# Patient Record
Sex: Male | Born: 1945 | Race: White | Hispanic: No | Marital: Married | Smoking: Never smoker
Health system: Southern US, Community
[De-identification: ages and names within clinical notes are randomized; demographics above are authoritative.]

## PROBLEM LIST (undated history)

## (undated) DIAGNOSIS — I739 Peripheral vascular disease, unspecified: Secondary | ICD-10-CM

## (undated) DIAGNOSIS — D649 Anemia, unspecified: Secondary | ICD-10-CM

## (undated) DIAGNOSIS — E876 Hypokalemia: Secondary | ICD-10-CM

## (undated) DIAGNOSIS — K654 Sclerosing mesenteritis: Secondary | ICD-10-CM

## (undated) DIAGNOSIS — K529 Noninfective gastroenteritis and colitis, unspecified: Secondary | ICD-10-CM

## (undated) DIAGNOSIS — N179 Acute kidney failure, unspecified: Secondary | ICD-10-CM

## (undated) DIAGNOSIS — E7800 Pure hypercholesterolemia, unspecified: Secondary | ICD-10-CM

## (undated) DIAGNOSIS — K8020 Calculus of gallbladder without cholecystitis without obstruction: Secondary | ICD-10-CM

## (undated) DIAGNOSIS — K635 Polyp of colon: Secondary | ICD-10-CM

## (undated) DIAGNOSIS — K5792 Diverticulitis of intestine, part unspecified, without perforation or abscess without bleeding: Secondary | ICD-10-CM

## (undated) DIAGNOSIS — N189 Chronic kidney disease, unspecified: Secondary | ICD-10-CM

## (undated) DIAGNOSIS — M1990 Unspecified osteoarthritis, unspecified site: Secondary | ICD-10-CM

## (undated) DIAGNOSIS — I10 Essential (primary) hypertension: Secondary | ICD-10-CM

## (undated) HISTORY — PX: PILONIDAL CYST EXCISION: SHX744

## (undated) HISTORY — PX: EYE SURGERY: SHX253

## (undated) HISTORY — DX: Pure hypercholesterolemia, unspecified: E78.00

## (undated) HISTORY — DX: Diverticulitis of intestine, part unspecified, without perforation or abscess without bleeding: K57.92

## (undated) HISTORY — DX: Essential (primary) hypertension: I10

## (undated) HISTORY — DX: Hypokalemia: E87.6

## (undated) HISTORY — PX: CARDIAC CATHETERIZATION: SHX172

## (undated) HISTORY — DX: Sclerosing mesenteritis: K65.4

## (undated) HISTORY — PX: OTHER SURGICAL HISTORY: SHX169

## (undated) HISTORY — DX: Noninfective gastroenteritis and colitis, unspecified: K52.9

## (undated) HISTORY — DX: Calculus of gallbladder without cholecystitis without obstruction: K80.20

## (undated) HISTORY — DX: Acute kidney failure, unspecified: N17.9

## (undated) HISTORY — PX: COLONOSCOPY: SHX174

---

## 2004-07-16 ENCOUNTER — Ambulatory Visit: Payer: Self-pay | Admitting: Pain Medicine

## 2004-09-17 ENCOUNTER — Ambulatory Visit: Payer: Self-pay | Admitting: Pain Medicine

## 2004-10-20 ENCOUNTER — Ambulatory Visit: Payer: Self-pay | Admitting: Gastroenterology

## 2004-11-10 ENCOUNTER — Ambulatory Visit: Payer: Self-pay | Admitting: Gastroenterology

## 2004-11-26 ENCOUNTER — Ambulatory Visit: Payer: Self-pay | Admitting: Pain Medicine

## 2005-01-06 ENCOUNTER — Ambulatory Visit: Payer: Self-pay | Admitting: Gastroenterology

## 2005-02-02 ENCOUNTER — Ambulatory Visit: Payer: Self-pay | Admitting: Gastroenterology

## 2005-02-16 ENCOUNTER — Ambulatory Visit: Payer: Self-pay | Admitting: Pain Medicine

## 2005-03-02 ENCOUNTER — Ambulatory Visit: Payer: Self-pay | Admitting: Internal Medicine

## 2005-05-11 ENCOUNTER — Ambulatory Visit: Payer: Self-pay | Admitting: Pain Medicine

## 2005-08-03 ENCOUNTER — Ambulatory Visit: Payer: Self-pay | Admitting: Pain Medicine

## 2005-08-11 ENCOUNTER — Ambulatory Visit: Payer: Self-pay | Admitting: Pain Medicine

## 2005-10-07 ENCOUNTER — Ambulatory Visit: Payer: Self-pay | Admitting: Pain Medicine

## 2005-12-28 ENCOUNTER — Ambulatory Visit: Payer: Self-pay | Admitting: Pain Medicine

## 2006-03-02 ENCOUNTER — Ambulatory Visit: Payer: Self-pay | Admitting: Pain Medicine

## 2006-03-08 ENCOUNTER — Ambulatory Visit: Payer: Self-pay | Admitting: Pain Medicine

## 2006-03-09 ENCOUNTER — Ambulatory Visit: Payer: Self-pay | Admitting: Pain Medicine

## 2006-03-28 ENCOUNTER — Ambulatory Visit: Payer: Self-pay | Admitting: Pain Medicine

## 2006-04-28 ENCOUNTER — Ambulatory Visit: Payer: Self-pay | Admitting: Pain Medicine

## 2006-07-21 ENCOUNTER — Ambulatory Visit: Payer: Self-pay | Admitting: Pain Medicine

## 2006-11-01 ENCOUNTER — Ambulatory Visit: Payer: Self-pay | Admitting: Pain Medicine

## 2006-11-26 ENCOUNTER — Emergency Department: Payer: Self-pay | Admitting: Emergency Medicine

## 2007-01-19 ENCOUNTER — Ambulatory Visit: Payer: Self-pay | Admitting: Pain Medicine

## 2007-04-13 ENCOUNTER — Ambulatory Visit: Payer: Self-pay | Admitting: Pain Medicine

## 2007-04-20 ENCOUNTER — Emergency Department: Payer: Self-pay

## 2007-04-20 ENCOUNTER — Other Ambulatory Visit: Payer: Self-pay

## 2007-06-18 ENCOUNTER — Emergency Department: Payer: Self-pay | Admitting: Internal Medicine

## 2007-08-03 ENCOUNTER — Ambulatory Visit: Payer: Self-pay | Admitting: Pain Medicine

## 2007-12-05 ENCOUNTER — Ambulatory Visit: Payer: Self-pay | Admitting: Pain Medicine

## 2008-03-07 ENCOUNTER — Ambulatory Visit: Payer: Self-pay | Admitting: Pain Medicine

## 2008-03-25 ENCOUNTER — Ambulatory Visit: Payer: Self-pay | Admitting: Unknown Physician Specialty

## 2008-07-23 ENCOUNTER — Ambulatory Visit: Payer: Self-pay | Admitting: Pain Medicine

## 2008-11-21 ENCOUNTER — Ambulatory Visit: Payer: Self-pay | Admitting: Pain Medicine

## 2009-03-06 ENCOUNTER — Ambulatory Visit: Payer: Self-pay | Admitting: Pain Medicine

## 2009-03-12 ENCOUNTER — Ambulatory Visit: Payer: Self-pay | Admitting: Pain Medicine

## 2009-04-14 ENCOUNTER — Ambulatory Visit: Payer: Self-pay | Admitting: Neurosurgery

## 2009-05-12 ENCOUNTER — Ambulatory Visit: Payer: Self-pay | Admitting: Pain Medicine

## 2009-06-12 ENCOUNTER — Ambulatory Visit: Payer: Self-pay | Admitting: Pain Medicine

## 2009-10-21 ENCOUNTER — Ambulatory Visit: Payer: Self-pay | Admitting: Pain Medicine

## 2010-01-22 ENCOUNTER — Ambulatory Visit: Payer: Self-pay | Admitting: Pain Medicine

## 2010-05-28 ENCOUNTER — Ambulatory Visit: Payer: Self-pay | Admitting: Pain Medicine

## 2010-10-08 ENCOUNTER — Ambulatory Visit: Payer: Self-pay | Admitting: Pain Medicine

## 2011-02-09 ENCOUNTER — Ambulatory Visit: Payer: Self-pay | Admitting: Pain Medicine

## 2011-05-09 ENCOUNTER — Emergency Department: Payer: Self-pay | Admitting: Emergency Medicine

## 2011-06-08 ENCOUNTER — Ambulatory Visit: Payer: Self-pay | Admitting: Pain Medicine

## 2011-10-19 ENCOUNTER — Ambulatory Visit: Payer: Self-pay | Admitting: Pain Medicine

## 2012-02-22 ENCOUNTER — Ambulatory Visit: Payer: Self-pay | Admitting: Pain Medicine

## 2012-07-13 ENCOUNTER — Ambulatory Visit: Payer: Self-pay | Admitting: Pain Medicine

## 2012-12-11 ENCOUNTER — Ambulatory Visit: Payer: Self-pay | Admitting: Pain Medicine

## 2013-04-05 ENCOUNTER — Ambulatory Visit: Payer: Self-pay | Admitting: Unknown Physician Specialty

## 2013-05-01 ENCOUNTER — Ambulatory Visit: Payer: Self-pay | Admitting: Pain Medicine

## 2013-08-28 ENCOUNTER — Ambulatory Visit: Payer: Self-pay | Admitting: Pain Medicine

## 2013-12-25 ENCOUNTER — Ambulatory Visit: Payer: Self-pay | Admitting: Pain Medicine

## 2014-02-22 ENCOUNTER — Emergency Department: Payer: Self-pay | Admitting: Emergency Medicine

## 2014-04-30 ENCOUNTER — Ambulatory Visit: Payer: Self-pay | Admitting: Pain Medicine

## 2014-08-16 ENCOUNTER — Observation Stay: Payer: Self-pay | Admitting: Internal Medicine

## 2014-08-16 LAB — URINALYSIS, COMPLETE
BACTERIA: NONE SEEN
Bilirubin,UR: NEGATIVE
Blood: NEGATIVE
GLUCOSE, UR: NEGATIVE mg/dL (ref 0–75)
KETONE: NEGATIVE
LEUKOCYTE ESTERASE: NEGATIVE
Nitrite: NEGATIVE
PH: 7 (ref 4.5–8.0)
Protein: 30
RBC,UR: 2 /HPF (ref 0–5)
Specific Gravity: 1.024 (ref 1.003–1.030)
WBC UR: 1 /HPF (ref 0–5)

## 2014-08-16 LAB — LIPASE, BLOOD: Lipase: 75 U/L (ref 73–393)

## 2014-08-16 LAB — COMPREHENSIVE METABOLIC PANEL
ALBUMIN: 3.6 g/dL (ref 3.4–5.0)
ANION GAP: 10 (ref 7–16)
Alkaline Phosphatase: 64 U/L
BUN: 26 mg/dL — AB (ref 7–18)
Bilirubin,Total: 0.6 mg/dL (ref 0.2–1.0)
Calcium, Total: 9.1 mg/dL (ref 8.5–10.1)
Chloride: 103 mmol/L (ref 98–107)
Co2: 28 mmol/L (ref 21–32)
Creatinine: 1.44 mg/dL — ABNORMAL HIGH (ref 0.60–1.30)
EGFR (African American): >60
EGFR (Non-African Amer.): 52 — ABNORMAL LOW
Glucose: 165 mg/dL — ABNORMAL HIGH (ref 65–99)
OSMOLALITY: 290 (ref 275–301)
Potassium: 3.1 mmol/L — ABNORMAL LOW (ref 3.5–5.1)
SGOT(AST): 18 U/L (ref 15–37)
SGPT (ALT): 24 U/L
Sodium: 141 mmol/L (ref 136–145)
Total Protein: 7.9 g/dL (ref 6.4–8.2)

## 2014-08-16 LAB — BASIC METABOLIC PANEL
ANION GAP: 6 — AB (ref 7–16)
BUN: 20 mg/dL — ABNORMAL HIGH (ref 7–18)
CALCIUM: 7.8 mg/dL — AB (ref 8.5–10.1)
CHLORIDE: 108 mmol/L — AB (ref 98–107)
Co2: 28 mmol/L (ref 21–32)
Creatinine: 1.12 mg/dL (ref 0.60–1.30)
EGFR (African American): >60
EGFR (Non-African Amer.): >60
GLUCOSE: 108 mg/dL — AB (ref 65–99)
OSMOLALITY: 286 (ref 275–301)
Potassium: 3 mmol/L — ABNORMAL LOW (ref 3.5–5.1)
SODIUM: 142 mmol/L (ref 136–145)

## 2014-08-16 LAB — CBC WITH DIFFERENTIAL/PLATELET
BASOS ABS: 0 10*3/uL (ref 0.0–0.1)
BASOS PCT: 0.3 %
EOS ABS: 0 10*3/uL (ref 0.0–0.7)
EOS PCT: 0.6 %
HCT: 47.9 % (ref 40.0–52.0)
HGB: 15.8 g/dL (ref 13.0–18.0)
LYMPHS ABS: 0.2 10*3/uL — AB (ref 1.0–3.6)
LYMPHS PCT: 2.4 %
MCH: 31.2 pg (ref 26.0–34.0)
MCHC: 33.1 g/dL (ref 32.0–36.0)
MCV: 94 fL (ref 80–100)
MONO ABS: 0.3 x10 3/mm (ref 0.2–1.0)
MONOS PCT: 4 %
Neutrophil #: 7.5 10*3/uL — ABNORMAL HIGH (ref 1.4–6.5)
Neutrophil %: 92.7 %
PLATELETS: 196 10*3/uL (ref 150–440)
RBC: 5.07 10*6/uL (ref 4.40–5.90)
RDW: 13.2 % (ref 11.5–14.5)
WBC: 8.1 10*3/uL (ref 3.8–10.6)

## 2014-08-16 LAB — CLOSTRIDIUM DIFFICILE(ARMC)

## 2014-08-16 LAB — TROPONIN I: Troponin-I: <0.02 ng/mL

## 2014-08-16 LAB — MAGNESIUM: MAGNESIUM: 1.7 mg/dL — AB

## 2014-08-17 LAB — CBC WITH DIFFERENTIAL/PLATELET
Basophil #: 0 x10 3/mm 3
Basophil %: 0.2 %
Eosinophil #: 0.2 x10 3/mm 3
Eosinophil %: 4.4 %
HCT: 38.9 % — ABNORMAL LOW
HGB: 13 g/dL
Lymphocyte %: 20.7 %
Lymphs Abs: 1 x10 3/mm 3
MCH: 31.7 pg
MCHC: 33.5 g/dL
MCV: 95 fL
Monocyte #: 0.7 "x10 3/mm "
Monocyte %: 14.9 %
Neutrophil #: 2.8 x10 3/mm 3
Neutrophil %: 59.8 %
Platelet: 163 x10 3/mm 3
RBC: 4.1 x10 6/mm 3 — ABNORMAL LOW
RDW: 13.9 %
WBC: 4.6 x10 3/mm 3

## 2014-08-17 LAB — COMPREHENSIVE METABOLIC PANEL
ALBUMIN: 2.7 g/dL — AB (ref 3.4–5.0)
ALK PHOS: 43 U/L — AB
Anion Gap: 3 — ABNORMAL LOW (ref 7–16)
BUN: 16 mg/dL (ref 7–18)
Bilirubin,Total: 0.4 mg/dL (ref 0.2–1.0)
CO2: 30 mmol/L (ref 21–32)
Calcium, Total: 7.5 mg/dL — ABNORMAL LOW (ref 8.5–10.1)
Chloride: 111 mmol/L — ABNORMAL HIGH (ref 98–107)
Creatinine: 1.16 mg/dL (ref 0.60–1.30)
EGFR (African American): >60
EGFR (Non-African Amer.): >60
GLUCOSE: 100 mg/dL — AB (ref 65–99)
OSMOLALITY: 288 (ref 275–301)
Potassium: 3.3 mmol/L — ABNORMAL LOW (ref 3.5–5.1)
SGOT(AST): 15 U/L (ref 15–37)
SGPT (ALT): 20 U/L
Sodium: 144 mmol/L (ref 136–145)
Total Protein: 5.9 g/dL — ABNORMAL LOW (ref 6.4–8.2)

## 2014-08-19 DIAGNOSIS — N179 Acute kidney failure, unspecified: Secondary | ICD-10-CM | POA: Insufficient documentation

## 2014-08-19 DIAGNOSIS — K529 Noninfective gastroenteritis and colitis, unspecified: Secondary | ICD-10-CM

## 2014-08-19 HISTORY — DX: Acute kidney failure, unspecified: N17.9

## 2014-08-19 HISTORY — DX: Noninfective gastroenteritis and colitis, unspecified: K52.9

## 2014-08-27 ENCOUNTER — Ambulatory Visit: Payer: Self-pay | Admitting: Pain Medicine

## 2014-12-31 ENCOUNTER — Ambulatory Visit
Admit: 2014-12-31 | Disposition: A | Discharge status: Home / Self Care | Payer: Self-pay | Attending: Pain Medicine | Admitting: Pain Medicine

## 2015-01-18 NOTE — Discharge Summary (Signed)
PATIENT NAME:  Brandon Bright, Brandon Bright MR#:  [id number] DATE OF BIRTH:  October 22, 1945  DATE OF ADMISSION:  08/16/2014 DATE OF DISCHARGE:  08/17/2014  ADMITTING DIAGNOSES:  1. Acute gastroenteritis, unclear etiology.  2. Acute renal failure.  3. Hypokalemia.  4. Orthostatic hypotension due to dehydration.  5. History of hypertension.   DISCHARGE DIAGNOSES:  1. Acute gastroenteritis, probably viral.  2. Acute kidney injury, probably prerenal from dehydration, resolved.  3. Hypokalemia, supplemented with potassium.  4. Orthostatic hypotension due to dehydration, resolved.  5. Chronic history of hypertension.   PROCEDURES: None.   CONSULTATIONS: None.   BRIEF HISTORY: The patient is a 69 year old Caucasian male brought into the ED with a chief complaint of nausea, vomiting, and diarrhea. Please review history and physical for details. The patient is admitted to the hospital with acute gastroenteritis of unclear etiology. The patient was feeling dizzy at that time. The patient was complaining of abdominal pain. Ultrasound of the abdomen has revealed no acute cholecystitis. CAT scan of the abdomen was done in the ED which revealed cholelithiasis without CT findings of acute cholecystitis.   HOSPITAL COURSE:  1. Acute gastroenteritis, probably viral. The patient was given IV fluid and antiemetics. Stool for Clostridium difficile was tested, which was negative. Nausea, vomiting, and diarrhea were resolved by the next day, November 21. Patient's abdominal pain was also completely resolved. The patient was started on diet and he tolerated that without any difficulty.  2. Acute kidney injury from dehydration was resolved with IV fluids. Renal function is back to normal.  3. Hypokalemia. Replaced with potassium supplements. His magnesium level was low, that was also supplemented.  4. Orthostatic hypotension was from dehydration from nausea, vomiting, and diarrhea resolved with IV fluids. The patient was not  feeling dizzy anymore. His blood pressure medications were resumed, as patient has chronic history of hypertension. Otherwise hospital course was uneventful. Decision was made to discharge the patient under stable condition.   CONDITION AT THE TIME OF DISCHARGE: Stable.   ACTIVITY: As tolerated.   LABORATORY AND IMAGING STUDIES:  1. CAT scan of the abdomen and pelvis with contrast has revealed cholelithiasis without CT findings of acute cholecystitis. Misty mesentery may be reactive.  2. Ultrasound of the abdomen cholelithiasis with no evidence of acute cholecystitis. Negative for biliary dilatation. Focal hyperechoic structure in the right hepatic lobe, likely corresponding fat attenuating lesion on the recent CT. 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  3. Patient's potassium is 3.3, calcium is 7.5, chloride at 111, glucose 100. The rest of the BMP was normal. LFTs: Albumin at 2.7, total protein 5.9, AST and ALT are normal. Alkaline phosphatase is low at 43. Troponin less than 0.02 x 3. WBC is normal, hemoglobin 13.0, hematocrit is 38.9, platelets are 163,000. Stool for Clostridium difficile is negative.  4. Urinalysis: Nitrites and leukocyte esterase are negative.    PHYSICAL EXAMINATION:  VITAL SIGNS: Temperature 98.4, pulse 96, respirations 18, blood pressure 124/76, orthostatic blood pressure 119/76 in the supine posture, 126/78 in standing posture.   MEDICATIONS AT THE TIME OF DISCHARGE:  Lyrica 20 mg p.o. at bedtime, aspirin 325 mg p.o. once daily, AREDS gel 2 times a day, Neurontin 300 mg 2-3 tablets p.o. b.i.d. as tolerated, Prinzide 20/25 one tablet p.o. once daily start on November 22, Percocet 5/325 one tablet p.o. every 4 hours as needed for pain, promethazine 12.5 mg 1 tablet every 6 hours as needed for nausea and vomiting.   ACTIVITY: As tolerated.  DIET: Regular, regular consistency.   FOLLOWUP: With primary care physician in 1-2 weeks. Return to work in 2 days.    PRIMARY CARE PHYSICIAN: Dr. Sampson Goon.   CODE STATUS: Full code.   Plan of care was discussed in detail with the patient and his family members. They verbalized understanding of the plan.   TOTAL TIME SPENT ON THE DISCHARGE: 45 minutes.    ____________________________ Ramonita Lab, MD ag:bm D: 08/20/2014 19:27:00 ET T: 08/21/2014 00:13:03 ET JOB#: [id number]  cc: Ramonita Lab, MD, <Dictator> Stann Mainland. Sampson Goon, MD  Ramonita Lab MD ELECTRONICALLY SIGNED 08/23/2014 18:11

## 2015-01-18 NOTE — H&P (Signed)
PATIENT NAME:  Brandon Bright, Prem W MR#:  [id number] DATE OF BIRTH:  [birth year]  DATE OF ADMISSION:  08/16/2014  PRIMARY CARE PHYSICIAN: (Dictation Anomaly)   HISTORY OF PRESENT ILLNESS:  The patient is a 69 year old Caucasian male with past medical history significant for history of hypertension, hyperlipidemia, macular degeneration, back pain due to osteoarthritis presents to the hospital with complaints of nausea, vomiting and diarrhea.  According to the patient, he was doing well up until around 6:00 p.m., when he started having nausea.  That nausea exacerbated and the patient started dry heaving at around 10:00 p.m. then he started vomiting and had numerous episodes of diarrheal stool.  There was no blood in his vomitus or in diarrheal stool.  No black or tarry looking stools. He had no fevers however, the patient felt chilly.  He was vomiting and having diarrhea up until approximately 3:00 a.m. when he presented to the Emergency Room for further evaluation because he became very dizzy, lightheaded and dehydrated.  He denies any sick contacts.  He stated that he ate at [employer] for lunch yesterday, but otherwise no family members were sick and no one else ate with him in [employer] at lunchtime.  On arrival to the Emergency Room, he was complaining of some suprapubic abdominal pains.  He was also noted to have some tenderness on examination and left lower quadrant.  He was also noted to be tachycardic with heart rate racing around 120 whenever he stood up and he would become lightheaded.  He became presyncopal in the Emergency Room.  He had CT scan of his abdomen and pelvis done in Emergency Room which showed, no significant abnormalities in his colon, cholelithiasis, but no acute cholecystitis was noted. Mesentery was noted to be misty, which was thought to be possibly reactive.   Hospitalist services were contacted for admission as the patient received 3 liters of IV fluids and is not getting better  yet.    PAST MEDICAL HISTORY:  Significant for history of hypertension, hyperlipidemia, history of back pain due to osteoarthritis of the back, macular degeneration.   MEDICATIONS: Aspirin 325 mg p.o. daily, AREDS 1 gel twice daily, Neurontin 300 mg 2 to 3 times daily, Nutrilite, multivitamins 1 tablet once daily, Prinzide 25 mg 1 tablet once daily, Zocor 20 mg at bedtime.   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    ALLERGIES: VICODIN AS WELL AS CODEINE.   FAMILY HISTORY: Diabetes mellitus in the patient's brother and other brother has coronary disease and coronary artery bypass grafting in his 7760s. The patient's father coronary artery disease, also in his 5960s.  Mother migraines as well as stroke.   SOCIAL HISTORY: The patient is married for 49 years, lives with his wife has 2 children, daughter lives close by. No smoking, alcohol abuse. He is retired.  He used to [employer], [employer].    REVIEW OF SYSTEMS: Positive for feeling chilly, fatigue and weak over the past day, pains in his abdomen, also low suprapubic pain, sharp left lower quadrant pain, glaucoma, macular degeneration, cough with no sputum production, some sinus infection diagnosed in 06/2014 by Dr. Jenne CampusMcQueen, treated with antibiotic therapy, nausea, vomiting and diarrhea, feeling presyncopal. Otherwise, denies any high fevers, weight loss or gain, denies tinnitus, blurry vision, double vision or cataracts. Denies allergies, epistaxis, sinus pain,  dentures, difficulty swallowing.  RESPIRATORY: Denies any wheezes, asthma, chronic obstructive pulmonary disease.  CARDIOVASCULAR:  Denies chest pain, orthopnea, edema, arrhythmias, palpitations.  GASTROINTESTINAL: Denies any hematemesis, rectal bleeding.  GENITOURINARY: Denies dysuria, hematuria, frequency, incontinence.   ENDOCRINOLOGY: Denies polydipsia, nocturia, thyroid problems, heat or cold intolerance or thirst.   HEMATOLOGY:  Denies anemia, easy bruising, bleeding, swollen glands. SKIN: Denies any acne, rash, lesions, or change in moles.  MUSCULOSKELETAL: Denies arthritis, cramps or swelling. NEUROLOGIC:  Denies numbness, epilepsy or tremor. PSYCHIATRIC:  Denies anxiety, insomnia or depression.     PHYSICAL EXAMINATION:  On arrival to the hospital: VITAL SIGNS:  Temperature was 98.2, pulse 114, respirations 18, blood pressure 143/85, saturation was 97% on room air. GENERAL:  Well-developed, well-nourished Caucasian male in no significant distress, on the stretcher.  HEENT:  The pupils were equal, reactive to light.  Extraocular muscles intact, no icterus or conjunctivitis.  Has normal hearing.  No pharyngeal erythema. Mucosa is dry.  NECK: No masses. Supple, nontender.  Thyroid not enlarged.  No adenopathy. No JVD or carotid bruits bilaterally. Full range of motion.  LUNGS: Clear to auscultation in all fields. No rales, rhonchi, diminished breath sounds or wheezing.  No labored inspirations, increased effort, dullness to percussion or overt respiratory distress.    CARDIOVASCULAR: S1, S2 appreciated. Rhythm is regular. PMI not lateralized. Chest is nontender to palpation, 1+ pedal pulses, no lower extremity edema, calf tenderness or cyanosis was noted.  ABDOMEN: Soft, tender in the left mid abdominal zone, somewhat uncomfortable in left lower quadrant, but no rebound or guarding were noted. Some minimal discomfort in suprapubic area. No hepatosplenomegaly or masses were noted.  RECTAL: Deferred.  MUSCLE STRENGTH: Able to move all extremities. No cyanosis, degenerative joint disease or kyphosis. Gait was not tested.  SKIN: Did not reveal rashes, lesions, erythema, nodularity, or induration. It was warm and dry to palpation.  LYMPHATIC: No adenopathy in the cervical region.  NEUROLOGICAL: Cranial nerves  grossly intact. Sensory is intact. No dysarthria or aphasia. The patient is alert, oriented to time, person and place, cooperative. Memory is good.  PSYCHIATRIC: No significant confusion, agitation, or depression noted. The patient was became tearful when talking about his children.    IMAGING:  The patient's EKG sinus tachycardia, (Dictation Anomaly) beats per minute, normal axis, left atrial enlargement, right bundle branch block, nonspecific ST-T changes.   LABORATORY DATA:  BUN and creatinine were 26 and 1.44, potassium 3.1, magnesium 1.7. Glucose 165, lipase 75, liver enzymes normal. The patient's troponin less than 0.02. White blood cell count is normal at 8.1, hemoglobin 14.8, (Dictation Anomaly), count is elevated at 7.5. Urinalysis; 2 red blood cells, 1 white blood cells. No nitrites or leukocyte esterase.   RADIOLOGIC STUDIES: Ultrasound of abdomen, limited survey, 08/16/2014 showed cholelithiasis but no evidence of acute cholecystitis, focal hypoechoic structure in the right hepatic lobe most likely corresponds with (Dictation Anomaly) on recent CT.  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  Negative for biliary dilatation.  CT scan of the abdomen and pelvis with contrast 08/16/2014 revealed cholelithiasis without CT evidence of acute cholecystitis, (Dictation Anomaly) of mesentery, which may be reactive.   ASSESSMENT AND PLAN:  1.  Acute gastroenteritis of unclear etiology at this time.  Rule out, questionable bacterial, toxic or viral.  We will add bacterial (Dictation Anomaly)  and continue IV fluids.  Continue antiemetics.   Get clostridium difficile as well as stool cultures and we will initiate antibiotic therapy if any of those cultures  are positive.  2.   Acute renal failure. Continue IV fluids. Follow the patient's kidney function daily.  3.  Hypokalemia; supplement IV.  The patient's magnesium level was also low, supplement IV as well.  4.  Orthostatic hypertension due  to dehydration.  We will continue IV fluids. We will follow up orthostatic vital signs, and we will hold blood pressure medications.  5.  History of hypertension, and now relatively hypotensive with tachycardia. We will hold blood pressure medications.   TIME SPENT: 50 minutes on this patient.      ____________________________ Katharina Caper, MD rv:DT D: 08/16/2014 11:18:40 ET T: 08/16/2014 12:22:43 ET JOB#: [id number]  cc: Katharina Caper, MD, <Dictator> Karri Kallenbach MD ELECTRONICALLY SIGNED 08/26/2014 21:14

## 2015-05-01 ENCOUNTER — Ambulatory Visit: Payer: Medicare Other | Attending: Pain Medicine | Admitting: Pain Medicine

## 2015-05-01 ENCOUNTER — Encounter: Payer: Self-pay | Admitting: Pain Medicine

## 2015-05-01 VITALS — BP 122/75 | HR 75 | Temp 97.7°F | Resp 16 | Ht 70.0 in | Wt 215.0 lb

## 2015-05-01 DIAGNOSIS — M5126 Other intervertebral disc displacement, lumbar region: Secondary | ICD-10-CM | POA: Insufficient documentation

## 2015-05-01 DIAGNOSIS — K8020 Calculus of gallbladder without cholecystitis without obstruction: Secondary | ICD-10-CM | POA: Diagnosis not present

## 2015-05-01 DIAGNOSIS — M79601 Pain in right arm: Secondary | ICD-10-CM | POA: Diagnosis present

## 2015-05-01 DIAGNOSIS — M5030 Other cervical disc degeneration, unspecified cervical region: Secondary | ICD-10-CM | POA: Diagnosis not present

## 2015-05-01 DIAGNOSIS — M542 Cervicalgia: Secondary | ICD-10-CM | POA: Diagnosis present

## 2015-05-01 DIAGNOSIS — M47812 Spondylosis without myelopathy or radiculopathy, cervical region: Secondary | ICD-10-CM | POA: Insufficient documentation

## 2015-05-01 DIAGNOSIS — M5136 Other intervertebral disc degeneration, lumbar region: Secondary | ICD-10-CM | POA: Diagnosis not present

## 2015-05-01 DIAGNOSIS — K5790 Diverticulosis of intestine, part unspecified, without perforation or abscess without bleeding: Secondary | ICD-10-CM | POA: Insufficient documentation

## 2015-05-01 DIAGNOSIS — M79602 Pain in left arm: Secondary | ICD-10-CM | POA: Diagnosis present

## 2015-05-01 MED ORDER — GABAPENTIN 300 MG PO CAPS: ORAL_CAPSULE | ORAL | Status: DC

## 2015-05-01 NOTE — Progress Notes (Signed)
   Subjective:    Patient ID: Brandon Bright, male    DOB: 19-May-1946, 69 y.o.   MRN: [id number]  HPI  Patient 69 year old gentleman [facility] and treatment of pain involving the neck upper extremity regions as well as the lower back and lower extremity region. Patient states the pain is well controlled with Neurontin alone patient without the use of Cymbalta or Skelaxin. Patient states that he was recently seen pending emergency room at [facility] in [place] and was told that he had diverticular disease as well as gallstones. Patient will follow-up with his primary care physician Dr. Sampson Goon for further assessment of his condition at this time as discussed the patient was understanding and in agreement with suggested treatment plan. We will continue patient's Neurontin at this time and will remain available to consider modification of treatment should there be significant change in patient's condition. Patient is understanding and agree with suggested treatment plan.     Review of Systems     Objective:   Physical Exam  There was minimal tenderness of the splenius capitis and occipitalis musculature region. Patient was with bilaterally equal grip strength. Tinel and Phalen's maneuver were without increase of pain significant degree. Palpation of the acromioclavicular glenohumeral joint region was without significantly increased pain. There was no crepitus of the thoracic region noted. Palpation over the lumbar paraspinal musculature region lumbar facet region was with mild discomfort. Lateral bending and rotation and extension and palpation of the lumbar facets reproduce mild discomfort. There was mild tenderness over the PSIS PII S region as well as the gluteal and piriformis musculature regions. Mild tenderness of the greater trochanteric region iliotibial band region. No sensory deficit of dermatomal distribution detected there was negative clonus  and negative Homans. Abdomen was nontender with no costovertebral tenderness noted.    Assessment & Plan:   Degenerative disc disease lumbar spine Broad-based disc bulging L1-2 through L5-S1 with L5-S1 moderate-sized central disc protrusion pressing the ventral thecal sac with evidence of facet arthropathy as well as L4-L5 right paracentral disc component abutting the extraforaminal right L4 nerve root  Degenerative disc disease cervical spine Degenerative changes multilevel involvement with C3-4 C4-5 degenerative changes especially  Diverticular disease  Cholelithiasis   Plan   Continue present medication Neurontin  F/U PCP Dr. Sampson Goon diverticular disease cholelithiasis as well as for for evaliation of  BP and general medical  Condition  Gastroenterological evaluation. We encouraged patient follow-up with Dr. Sampson Goon for further evaluation of his GI condition and to consider gastroenterological evaluation as well  F/U surgical evaluation  F/U neurological evaluation  May consider radiofrequency rhizolysis or intraspinal procedures pending response to present treatment and F/U evaluation   [facility] to call [facility] should patient have concerns prior to scheduled return appointmen.

## 2015-05-01 NOTE — Patient Instructions (Addendum)
Continue present medication Neurontin  F/U PCP Dr. Sampson Goon for evaliation of  BP and general medical  condition  F/U surgical evaluation  F/U neurological evaluation  May consider radiofrequency rhizolysis or intraspinal procedures pending response to present treatment and F/U evaluation   [facility] to call [facility] should patient have concerns prior to scheduled return appointmen.

## 2015-05-01 NOTE — Progress Notes (Signed)
Discharged to home, ambulatory with script in hand for neurontin.  Return in 3 months

## 2015-05-01 NOTE — Progress Notes (Signed)
Safety precautions to be maintained throughout the outpatient stay will include: orient to surroundings, keep bed in low position, maintain call bell within reach at all times, provide assistance with transfer out of bed and ambulation.  

## 2015-05-05 ENCOUNTER — Encounter: Payer: Self-pay | Admitting: Pain Medicine

## 2015-07-11 ENCOUNTER — Encounter: Payer: Self-pay | Admitting: Emergency Medicine

## 2015-07-11 ENCOUNTER — Ambulatory Visit
Admission: EM | Admit: 2015-07-11 | Discharge: 2015-07-11 | Disposition: A | Discharge status: Home / Self Care | Payer: Medicare Other | Attending: Internal Medicine | Admitting: Internal Medicine

## 2015-07-11 DIAGNOSIS — J069 Acute upper respiratory infection, unspecified: Secondary | ICD-10-CM

## 2015-07-11 MED ORDER — TRIAMCINOLONE ACETONIDE 55 MCG/ACT NA AERO: 2.0000 | INHALATION_SPRAY | Freq: Every day | NASAL | Status: DC

## 2015-07-11 NOTE — ED Notes (Signed)
Patient c/o runny nose, HAs, and sinus congestion and pressure since yesterday.  Patient denies fevers.

## 2015-07-11 NOTE — Discharge Instructions (Signed)
No signs of dangerous illness today. Nasacort or flonase may help with congestion; prescription for nasacort sent to the pharmacy. Anticipate gradual improvement in congestion, sinus drainage, other symptoms, over the next week or so. Recheck or followup with Dr Sampson GoonFitzgerald if not starting to improve in a few days, or for new fever >100.5 or increasing phlegm production.  Upper Respiratory Infection, Adult Most upper respiratory infections (URIs) are a viral infection of the air passages leading to the lungs. A URI affects the nose, throat, and upper air passages. The most common type of URI is nasopharyngitis and is typically referred to as "the common cold." URIs run their course and usually go away on their own. Most of the time, a URI does not require medical attention, but sometimes a bacterial infection in the upper airways can follow a viral infection. This is called a secondary infection. Sinus and middle ear infections are common types of secondary upper respiratory infections. Bacterial pneumonia can also complicate a URI. A URI can worsen asthma and chronic obstructive pulmonary disease (COPD). Sometimes, these complications can require emergency medical care and may be life threatening.  CAUSES Almost all URIs are caused by viruses. A virus is a type of germ and can spread from one person to another.  RISKS FACTORS You may be at risk for a URI if:   You smoke.   You have chronic heart or lung disease.  You have a weakened defense (immune) system.   You are very young or very old.   You have nasal allergies or asthma.  You work in crowded or poorly ventilated areas.  You work in health care facilities or schools. SIGNS AND SYMPTOMS  Symptoms typically develop 2-3 days after you come in contact with a cold virus. Most viral URIs last 7-10 days. However, viral URIs from the influenza virus (flu virus) can last 14-18 days and are typically more severe. Symptoms may include:    Runny or stuffy (congested) nose.   Sneezing.   Cough.   Sore throat.   Headache.   Fatigue.   Fever.   Loss of appetite.   Pain in your forehead, behind your eyes, and over your cheekbones (sinus pain).  Muscle aches.  DIAGNOSIS  Your health care provider may diagnose a URI by:  Physical exam.  Tests to check that your symptoms are not due to another condition such as:  Strep throat.  Sinusitis.  Pneumonia.  Asthma. TREATMENT  A URI goes away on its own with time. It cannot be cured with medicines, but medicines may be prescribed or recommended to relieve symptoms. Medicines may help:  Reduce your fever.  Reduce your cough.  Relieve nasal congestion. HOME CARE INSTRUCTIONS   Take medicines only as directed by your health care provider.   Gargle warm saltwater or take cough drops to comfort your throat as directed by your health care provider.  Use a warm mist humidifier or inhale steam from a shower to increase air moisture. This may make it easier to breathe.  Drink enough fluid to keep your urine clear or pale yellow.   Eat soups and other clear broths and maintain good nutrition.   Rest as needed.   Return to work when your temperature has returned to normal or as your health care provider advises. You may need to stay home longer to avoid infecting others. You can also use a face mask and careful hand washing to prevent spread of the virus.  Increase the usage  of your inhaler if you have asthma.   Do not use any tobacco products, including cigarettes, chewing tobacco, or electronic cigarettes. If you need help quitting, ask your health care provider. PREVENTION  The best way to protect yourself from getting a cold is to practice good hygiene.   Avoid oral or hand contact with people with cold symptoms.   Wash your hands often if contact occurs.  There is no clear evidence that vitamin C, vitamin E, echinacea, or exercise  reduces the chance of developing a cold. However, it is always recommended to get plenty of rest, exercise, and practice good nutrition.  SEEK MEDICAL CARE IF:   You are getting worse rather than better.   Your symptoms are not controlled by medicine.   You have chills.  You have worsening shortness of breath.  You have brown or red mucus.  You have yellow or brown nasal discharge.  You have pain in your face, especially when you bend forward.  You have a fever.  You have swollen neck glands.  You have pain while swallowing.  You have white areas in the back of your throat. SEEK IMMEDIATE MEDICAL CARE IF:   You have severe or persistent:  Headache.  Ear pain.  Sinus pain.  Chest pain.  You have chronic lung disease and any of the following:  Wheezing.  Prolonged cough.  Coughing up blood.  A change in your usual mucus.  You have a stiff neck.  You have changes in your:  Vision.  Hearing.  Thinking.  Mood. MAKE SURE YOU:   Understand these instructions.  Will watch your condition.  Will get help right away if you are not doing well or get worse.   This information is not intended to replace advice given to you by your health care provider. Make sure you discuss any questions you have with your health care provider.   Document Released: 03/09/2001 Document Revised: 01/28/2015 Document Reviewed: 12/19/2013 Elsevier Interactive Patient Education [employer]

## 2015-07-11 NOTE — ED Provider Notes (Signed)
CSN: [id number]     Arrival date & time 07/11/15  [id number] History   First MD Initiated Contact with Patient 07/11/15 1004     Chief Complaint  Patient presents with  . Facial Pain  . Headache  . Nasal Congestion   HPI  Patient is a 69 year old gentleman who presents with the onset yesterday of achiness, malaise, headache. A lot of clear runny nose, some sinus drainage. No fever. Occasional coughing. Throat irritation. Didn't sleep well last night. No vomiting, no diarrhea. Appetite is fine. No rash. PCP is Dr. Sampson GoonFitzgerald. He is undergoing evaluation for episodic abdominal pain.  Past Medical History  Diagnosis Date  . Hypertension   . Hypercholesterolemia   . Diverticulitis   . Gallstones    Past Surgical History  Procedure Laterality Date  . Ganglion cyst removal Right    Family History  Problem Relation Age of Onset  . Heart disease Father    Social History  Substance Use Topics  . Smoking status: Never Smoker   . Smokeless tobacco: None  . Alcohol Use: No    Review of Systems  All other systems reviewed and are negative.   Allergies  Codeine and Vicodin  Home Medications   Prior to Admission medications   Medication Sig Start Date End Date Taking? Authorizing Provider  latanoprost (XALATAN) 0.005 % ophthalmic solution Place 1 drop into both eyes at bedtime.   Yes Historical Provider, MD  aspirin 325 MG tablet Take 325 mg by mouth daily.    Historical Provider, MD  fexofenadine (ALLEGRA) 180 MG tablet Take 180 mg by mouth daily. As needed    Historical Provider, MD  gabapentin (NEURONTIN) 300 MG capsule Limit 2-3 tablets by mouth per day or twice per day if tolerated 05/01/15   Ewing SchleinGregory Crisp, MD  lisinopril-hydrochlorothiazide (PRINZIDE,ZESTORETIC) 20-25 MG per tablet Take 1 tablet by mouth daily.    Historical Provider, MD  Multiple Vitamins-Minerals (PRESERVISION AREDS PO) Take by mouth daily.    Historical Provider, MD  simvastatin (ZOCOR) 20 MG tablet Take 20 mg by  mouth daily.    Historical Provider, MD           BP 115/57 mmHg  Pulse 80  Temp(Src) 98.9 F (37.2 C) (Tympanic)  Resp 17  Ht 5\' 10"  (1.778 m)  Wt 228 lb (103.42 kg)  BMI 32.71 kg/m2  SpO2 95%   Physical Exam  Constitutional: He is oriented to person, place, and time. No distress.  Alert, nicely groomed Voice is quite congested Able to walk into the urgent care independently  HENT:  Head: Atraumatic.  Bilateral TMs flushed faintly pink, mildly dull Marked nasal congestion bilaterally Throat somewhat red  Eyes:  Conjugate gaze, no eye redness/drainage  Neck: Neck supple.  Cardiovascular: Normal rate and regular rhythm.   Pulmonary/Chest: No respiratory distress. He has no wheezes. He has no rales.  Lungs clear, symmetric breath sounds  Abdominal: Soft. He exhibits no distension.  Musculoskeletal: Normal range of motion.  Trace bilateral lower extremity edema, patient reports usually right greater than left. Wearing below the knee bilateral venous compression stockings.  Neurological: He is alert and oriented to person, place, and time.  Skin: Skin is warm and dry.  Pink. No cyanosis  Nursing note and vitals reviewed.   ED Course  Procedures  None today  MDM   1. Upper respiratory infection    A nasal steroid like Flonase or Nasacort may relieve congestion. Prescription was sent to the pharmacy. For profuse  rhinorrhea, Claritin or similar OTC antihistamine may be helpful. Anticipate gradual improvement in congestion, rhinorrhea, and well-being, over the next week or so. Recheck or follow up PCP for new fever greater than 100.5, increasing phlegm production.    Eustace Moore, MD 07/11/15 [phone]

## 2015-09-02 ENCOUNTER — Encounter: Payer: Self-pay | Admitting: Pain Medicine

## 2015-09-02 ENCOUNTER — Ambulatory Visit: Payer: Medicare Other | Attending: Pain Medicine | Admitting: Pain Medicine

## 2015-09-02 VITALS — BP 132/78 | HR 77 | Temp 97.9°F | Resp 16 | Ht 70.0 in | Wt 218.0 lb

## 2015-09-02 DIAGNOSIS — M47816 Spondylosis without myelopathy or radiculopathy, lumbar region: Secondary | ICD-10-CM | POA: Insufficient documentation

## 2015-09-02 DIAGNOSIS — M79602 Pain in left arm: Secondary | ICD-10-CM | POA: Diagnosis present

## 2015-09-02 DIAGNOSIS — M545 Low back pain: Secondary | ICD-10-CM | POA: Diagnosis present

## 2015-09-02 DIAGNOSIS — M5030 Other cervical disc degeneration, unspecified cervical region: Secondary | ICD-10-CM

## 2015-09-02 DIAGNOSIS — K8020 Calculus of gallbladder without cholecystitis without obstruction: Secondary | ICD-10-CM | POA: Insufficient documentation

## 2015-09-02 DIAGNOSIS — M47812 Spondylosis without myelopathy or radiculopathy, cervical region: Secondary | ICD-10-CM | POA: Insufficient documentation

## 2015-09-02 DIAGNOSIS — M5136 Other intervertebral disc degeneration, lumbar region: Secondary | ICD-10-CM | POA: Diagnosis not present

## 2015-09-02 DIAGNOSIS — M5126 Other intervertebral disc displacement, lumbar region: Secondary | ICD-10-CM | POA: Diagnosis not present

## 2015-09-02 DIAGNOSIS — M542 Cervicalgia: Secondary | ICD-10-CM | POA: Diagnosis present

## 2015-09-02 MED ORDER — GABAPENTIN 300 MG PO CAPS: ORAL_CAPSULE | ORAL | Status: DC

## 2015-09-02 NOTE — Progress Notes (Signed)
   Subjective:    Patient ID: Brandon Bright, male    DOB: 10/18/1945, 69 y.o.   MRN: [id number]  HPI  Patient is 69 year old gentleman who returns to pain management Center for further evaluation and treatment of pain involving the region of the neck upper extremity region as well as the lower back and lower extremity region. Patient states she is doing remarkably well at this time. Patient continues the use of Neurontin. We discussed patient's condition patient stated that he [place] and walked the entire distance of the parade as well as walking consider for distance after the pure. The patient also recently [facility] [place] and also considerable distances. The patient appears to be without significant pain and is able to enjoying activities of daily living without experiencing any significant pain and is without undesirable side effects due to Neurontin. We will continue Neurontin as prescribed and we'll remain available to consider modification of treatment should there be change patient's condition. The patient was with understanding and agreed to suggested treatment plan.      Review of Systems     Objective:   Physical Exam  There was minimal tenderness to palpation of the splenius capitis and occipitalis musketry region. There was minimal tenderness to palpation over the cervical facet cervical paraspinal muscular treat. Patient appeared to be with bilaterally equal grip strength and Tinel and Phalen's maneuver were without increased pain of significant degree. Palpation over the thoracic facet thoracic paraspinal must reason was without increased pain of significant degree and no crepitus of the thoracic region was noted. Palpation over the lumbar paraspinal must reason lumbar facet region was without increased pain of significant degree. Lateral bending rotation extension and palpation of the lumbar facets reproduced minimal discomfort. There was  minimal tense to palpation over the PSIS and PII S region as well as the gluteal and piriformis musculature region. DTRs appeared to be trace at the knees there was negative clonus negative Homansand she deficit or dermatomal distribution detected. Abdomen soft nontender and no costovertebral tenderness noted.                Assessment & Plan:     Degenerative disc disease lumbar spine Broad-based disc bulging L1-2 through L5-S1 with L5-S1 moderate-sized central disc protrusion pressing the ventral thecal sac with evidence of facet arthropathy as well as L4-L5 right paracentral disc component abutting the extraforaminal right L4 nerve root  Degenerative disc disease cervical spine Degenerative changes multilevel involvement with C3-4 C4-5 degenerative changes especially  Diverticular disease  Cholelithiasis     PLAN   Continue present medication Neurontin  F/U PCP Dr. Sampson GoonFitzgerald for evaliation of  BP and general medical  condition  F/U surgical evaluation  F/U neurological evaluation  May consider radiofrequency rhizolysis or intraspinal procedures pending response to present treatment and F/U evaluation Patient with pain well-controlled at this time. Will avoid interventional treatment   Patient to call Pain Management Center should patient have concerns prior to scheduled return appointment.

## 2015-09-02 NOTE — Patient Instructions (Addendum)
Continue present medication Neurontin  F/U PCP Dr. Sampson GoonFitzgerald for evaliation of  BP and general medical  condition  F/U surgical evaluation  F/U neurological evaluation  May consider radiofrequency rhizolysis or intraspinal procedures pending response to present treatment and F/U evaluation   Patient to call Pain Management Center should patient have concerns prior to scheduled return appointment.   A prescription for GABAPENTIN was given to you today.

## 2015-09-02 NOTE — Progress Notes (Signed)
Safety precautions to be maintained throughout the outpatient stay will include: orient to surroundings, keep bed in low position, maintain call bell within reach at all times, provide assistance with transfer out of bed and ambulation.  

## 2015-10-21 ENCOUNTER — Other Ambulatory Visit: Payer: Self-pay | Admitting: Physician Assistant

## 2015-10-21 DIAGNOSIS — R1031 Right lower quadrant pain: Secondary | ICD-10-CM

## 2015-10-21 DIAGNOSIS — R197 Diarrhea, unspecified: Secondary | ICD-10-CM

## 2015-10-21 DIAGNOSIS — R935 Abnormal findings on diagnostic imaging of other abdominal regions, including retroperitoneum: Secondary | ICD-10-CM

## 2015-10-21 DIAGNOSIS — R1032 Left lower quadrant pain: Secondary | ICD-10-CM

## 2015-10-27 ENCOUNTER — Ambulatory Visit
Admission: RE | Admit: 2015-10-27 | Discharge: 2015-10-27 | Disposition: A | Discharge status: Home / Self Care | Payer: Medicare Other | Source: Ambulatory Visit | Attending: Physician Assistant | Admitting: Physician Assistant

## 2015-10-27 DIAGNOSIS — R1031 Right lower quadrant pain: Secondary | ICD-10-CM | POA: Diagnosis not present

## 2015-10-27 DIAGNOSIS — R197 Diarrhea, unspecified: Secondary | ICD-10-CM | POA: Diagnosis not present

## 2015-10-27 DIAGNOSIS — M4806 Spinal stenosis, lumbar region: Secondary | ICD-10-CM | POA: Diagnosis not present

## 2015-10-27 DIAGNOSIS — R935 Abnormal findings on diagnostic imaging of other abdominal regions, including retroperitoneum: Secondary | ICD-10-CM | POA: Diagnosis not present

## 2015-10-27 DIAGNOSIS — R1032 Left lower quadrant pain: Secondary | ICD-10-CM | POA: Insufficient documentation

## 2015-10-27 DIAGNOSIS — K8020 Calculus of gallbladder without cholecystitis without obstruction: Secondary | ICD-10-CM | POA: Diagnosis not present

## 2015-10-27 DIAGNOSIS — I10 Essential (primary) hypertension: Secondary | ICD-10-CM | POA: Diagnosis not present

## 2015-10-27 MED ORDER — IOHEXOL 300 MG/ML  SOLN
100.0000 mL | Freq: Once | INTRAMUSCULAR | Status: AC | PRN
  Administered 2015-10-27: 100 mL via INTRAVENOUS

## 2015-12-15 DIAGNOSIS — M778 Other enthesopathies, not elsewhere classified: Secondary | ICD-10-CM | POA: Insufficient documentation

## 2015-12-15 DIAGNOSIS — K654 Sclerosing mesenteritis: Secondary | ICD-10-CM

## 2015-12-15 HISTORY — DX: Sclerosing mesenteritis: K65.4

## 2015-12-25 ENCOUNTER — Ambulatory Visit: Payer: Medicare Other | Attending: Pain Medicine | Admitting: Pain Medicine

## 2015-12-25 ENCOUNTER — Encounter: Payer: Self-pay | Admitting: Pain Medicine

## 2015-12-25 VITALS — BP 144/84 | HR 81 | Temp 97.6°F | Resp 16 | Ht 70.0 in | Wt 220.0 lb

## 2015-12-25 DIAGNOSIS — M47816 Spondylosis without myelopathy or radiculopathy, lumbar region: Secondary | ICD-10-CM

## 2015-12-25 DIAGNOSIS — M545 Low back pain: Secondary | ICD-10-CM | POA: Diagnosis present

## 2015-12-25 DIAGNOSIS — K5790 Diverticulosis of intestine, part unspecified, without perforation or abscess without bleeding: Secondary | ICD-10-CM | POA: Diagnosis not present

## 2015-12-25 DIAGNOSIS — M5126 Other intervertebral disc displacement, lumbar region: Secondary | ICD-10-CM | POA: Insufficient documentation

## 2015-12-25 DIAGNOSIS — M5136 Other intervertebral disc degeneration, lumbar region: Secondary | ICD-10-CM | POA: Diagnosis not present

## 2015-12-25 DIAGNOSIS — M5030 Other cervical disc degeneration, unspecified cervical region: Secondary | ICD-10-CM | POA: Insufficient documentation

## 2015-12-25 DIAGNOSIS — M47812 Spondylosis without myelopathy or radiculopathy, cervical region: Secondary | ICD-10-CM | POA: Diagnosis not present

## 2015-12-25 DIAGNOSIS — M79606 Pain in leg, unspecified: Secondary | ICD-10-CM | POA: Diagnosis present

## 2015-12-25 MED ORDER — GABAPENTIN 300 MG PO CAPS: ORAL_CAPSULE | ORAL | Status: DC

## 2015-12-25 NOTE — Patient Instructions (Signed)
PLAN   Continue present medication Neurontin  F/U PCP Dr. Sampson GoonFitzgerald for evaliation of  BP and general medical  condition  F/U surgical evaluation. May consider pending further evaluation  F/U neurological evaluation. May consider pending follow-up evaluations  May consider radiofrequency rhizolysis or intraspinal procedures pending response to present treatment and F/U evaluation. We will avoid considering such procedures at this time   Patient to call Pain Management Center should patient have concerns prior to scheduled return appointment.

## 2015-12-25 NOTE — Progress Notes (Signed)
   Subjective:    Patient ID: Brandon Bright, male    DOB: 01/08/1946, 70 y.o.   MRN: [id number]  HPI  The patient is a 70 year old gentleman who returns to pain management for further evaluation and treatment of pain which is involving the lower back and lower extremity region predominantly. The patient states that his pain is well-controlled at this time. Patient states that he continues to take Neurontin and is without need for other medications for treatment of his pain. 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 We will continue Neurontin as prescribed at this time and we'll remain available to consider patient for modification of treatment regimen should they be significant change in patient's condition. The patient was with understanding and agreed with suggested treatment plan. We will continue Neurontin as prescribed at this time.      Review of Systems     Objective:   Physical Exam   There was tenderness to palpation of the splenius capitis and occipitalis musculature regions of minimal degree. There was minimal tenderness of the cervical facet cervical paraspinal musculature region. Palpation over the thoracic facet thoracic paraspinal musculature region was without increase of pain of any significant degree. Palpation of the acromioclavicular and glenohumeral joint regions were without increased pain of any significant degree. There was unremarkable Spurling's maneuver. Patient was able to perform drop test without any difficulty. Palpation of the upper mid and lower thoracic region were without increased spasm of significant degree with no crepitus of the thoracic region noted as well as. Palpation over the lumbar paraspinal must reason lumbar facet region was without increase of pain of significant degree. Patient was able to perform lateral bending rotation extension and palpation over the lumbar  facets without experiencing any significant pain. There was minimal tenderness of the PSIS and PII S regions as well palpation over the region of the greater trochanteric region iliotibial band region was without increase of pain of significant degree. Straight leg raise was tolerates appointment 30 without increase of pain with dorsiflexion noted. DTRs were difficult to elicit. Patient had difficulty relaxing. There was negative clonus negative Homans. There was no definite sensory deficit or dermatomal distribution detected. Abdomen nontender with no costovertebral tenderness noted     Assessment & Plan:     Degenerative disc disease lumbar spine Broad-based disc bulging L1-2 through L5-S1 with L5-S1 moderate-sized central disc protrusion pressing the ventral thecal sac with evidence of facet arthropathy as well as L4-L5 right paracentral disc component abutting the extraforaminal right L4 nerve root  Degenerative disc disease cervical spine Degenerative changes multilevel involvement with C3-4 C4-5 degenerative changes especially  Diverticular disease  Cholelithiasis (history of)     PLAN   Continue present medication Neurontin  F/U PCP Dr. Sampson GoonFitzgerald for evaliation of  BP and general medical  condition  F/U surgical evaluation. May consider pending further evaluation  F/U neurological evaluation. May consider pending follow-up evaluations  May consider radiofrequency rhizolysis or intraspinal procedures pending response to present treatment and F/U evaluation. We will avoid considering such procedures at this time   Patient to call Pain Management Center should patient have concerns prior to scheduled return appointment.

## 2016-01-02 DIAGNOSIS — E876 Hypokalemia: Secondary | ICD-10-CM

## 2016-01-02 HISTORY — DX: Hypokalemia: E87.6

## 2016-01-29 NOTE — Discharge Instructions (Signed)

## 2016-01-30 ENCOUNTER — Encounter: Payer: Self-pay | Admitting: *Deleted

## 2016-02-04 ENCOUNTER — Ambulatory Visit: Payer: Medicare Other | Admitting: Anesthesiology

## 2016-02-04 ENCOUNTER — Ambulatory Visit
Admission: RE | Admit: 2016-02-04 | Discharge: 2016-02-04 | Disposition: A | Discharge status: Home / Self Care | Payer: Medicare Other | Source: Ambulatory Visit | Attending: Ophthalmology | Admitting: Ophthalmology

## 2016-02-04 ENCOUNTER — Encounter
Admission: RE | Disposition: A | Discharge status: Home / Self Care | Payer: Self-pay | Source: Ambulatory Visit | Attending: Ophthalmology

## 2016-02-04 DIAGNOSIS — M1990 Unspecified osteoarthritis, unspecified site: Secondary | ICD-10-CM | POA: Insufficient documentation

## 2016-02-04 DIAGNOSIS — E7800 Pure hypercholesterolemia, unspecified: Secondary | ICD-10-CM | POA: Insufficient documentation

## 2016-02-04 DIAGNOSIS — M7989 Other specified soft tissue disorders: Secondary | ICD-10-CM | POA: Insufficient documentation

## 2016-02-04 DIAGNOSIS — I8390 Asymptomatic varicose veins of unspecified lower extremity: Secondary | ICD-10-CM | POA: Diagnosis not present

## 2016-02-04 DIAGNOSIS — Z880 Allergy status to penicillin: Secondary | ICD-10-CM | POA: Insufficient documentation

## 2016-02-04 DIAGNOSIS — Z885 Allergy status to narcotic agent status: Secondary | ICD-10-CM | POA: Diagnosis not present

## 2016-02-04 DIAGNOSIS — Z86010 Personal history of colonic polyps: Secondary | ICD-10-CM | POA: Diagnosis not present

## 2016-02-04 DIAGNOSIS — I10 Essential (primary) hypertension: Secondary | ICD-10-CM | POA: Insufficient documentation

## 2016-02-04 DIAGNOSIS — Z888 Allergy status to other drugs, medicaments and biological substances status: Secondary | ICD-10-CM | POA: Diagnosis not present

## 2016-02-04 DIAGNOSIS — H2512 Age-related nuclear cataract, left eye: Secondary | ICD-10-CM | POA: Insufficient documentation

## 2016-02-04 HISTORY — PX: CATARACT EXTRACTION W/PHACO: SHX586

## 2016-02-04 SURGERY — PHACOEMULSIFICATION, CATARACT, WITH IOL INSERTION
Anesthesia: Monitor Anesthesia Care | Laterality: Left | Wound class: Clean

## 2016-02-04 MED ORDER — LIDOCAINE HCL (PF) 4 % IJ SOLN
INTRAOCULAR | Status: DC | PRN
  Administered 2016-02-04: 1 mL via OPHTHALMIC

## 2016-02-04 MED ORDER — MIDAZOLAM HCL 2 MG/2ML IJ SOLN
INTRAMUSCULAR | Status: DC | PRN
  Administered 2016-02-04: 2 mg via INTRAVENOUS

## 2016-02-04 MED ORDER — TIMOLOL MALEATE 0.5 % OP SOLN
OPHTHALMIC | Status: DC | PRN
  Administered 2016-02-04: 1 [drp] via OPHTHALMIC

## 2016-02-04 MED ORDER — ARMC OPHTHALMIC DILATING GEL
1.0000 "application " | OPHTHALMIC | Status: DC | PRN
  Administered 2016-02-04: 1 via OPHTHALMIC

## 2016-02-04 MED ORDER — POVIDONE-IODINE 5 % OP SOLN
1.0000 "application " | OPHTHALMIC | Status: DC | PRN
  Administered 2016-02-04: 1 via OPHTHALMIC

## 2016-02-04 MED ORDER — BRIMONIDINE TARTRATE 0.2 % OP SOLN
OPHTHALMIC | Status: DC | PRN
  Administered 2016-02-04: 1 [drp] via OPHTHALMIC

## 2016-02-04 MED ORDER — NA HYALUR & NA CHOND-NA HYALUR 0.4-0.35 ML IO KIT
PACK | INTRAOCULAR | Status: DC | PRN
  Administered 2016-02-04: 1 mL via INTRAOCULAR

## 2016-02-04 MED ORDER — TETRACAINE HCL 0.5 % OP SOLN
1.0000 [drp] | OPHTHALMIC | Status: DC | PRN
  Administered 2016-02-04: 1 [drp] via OPHTHALMIC

## 2016-02-04 MED ORDER — FENTANYL CITRATE (PF) 100 MCG/2ML IJ SOLN
INTRAMUSCULAR | Status: DC | PRN
  Administered 2016-02-04: 100 ug via INTRAVENOUS

## 2016-02-04 MED ORDER — CEFUROXIME OPHTHALMIC INJECTION 1 MG/0.1 ML
INJECTION | OPHTHALMIC | Status: DC | PRN
  Administered 2016-02-04: 0.1 mL via INTRACAMERAL

## 2016-02-04 MED ORDER — EPINEPHRINE HCL 1 MG/ML IJ SOLN
INTRAOCULAR | Status: DC | PRN
  Administered 2016-02-04: 72 mL via OPHTHALMIC

## 2016-02-04 SURGICAL SUPPLY — 21 items
CANNULA ANT/CHMB 27GA (MISCELLANEOUS) ×3 IMPLANT
CARTRIDGE ABBOTT (MISCELLANEOUS) IMPLANT
GLOVE SURG LX 7.5 STRW (GLOVE) ×2
GLOVE SURG LX STRL 7.5 STRW (GLOVE) ×1 IMPLANT
GLOVE SURG TRIUMPH 8.0 PF LTX (GLOVE) ×3 IMPLANT
GOWN STRL REUS W/ TWL LRG LVL3 (GOWN DISPOSABLE) ×2 IMPLANT
GOWN STRL REUS W/TWL LRG LVL3 (GOWN DISPOSABLE) ×4
LENS IOL TECNIS ITEC 20.5 (Intraocular Lens) ×3 IMPLANT
MARKER SKIN DUAL TIP RULER LAB (MISCELLANEOUS) ×3 IMPLANT
NDL RETROBULBAR .5 NSTRL (NEEDLE) IMPLANT
PACK CATARACT BRASINGTON (MISCELLANEOUS) ×3 IMPLANT
PACK EYE AFTER SURG (MISCELLANEOUS) ×3 IMPLANT
PACK OPTHALMIC (MISCELLANEOUS) ×3 IMPLANT
RING MALYGIN 7.0 (MISCELLANEOUS) IMPLANT
SUT ETHILON 10-0 CS-B-6CS-B-6 (SUTURE)
SUT VICRYL  9 0 (SUTURE)
SUT VICRYL 9 0 (SUTURE) IMPLANT
SUTURE EHLN 10-0 CS-B-6CS-B-6 (SUTURE) IMPLANT
SYR TB 1ML LUER SLIP (SYRINGE) ×3 IMPLANT
WATER STERILE IRR 250ML POUR (IV SOLUTION) ×3 IMPLANT
WIPE NON LINTING 3.25X3.25 (MISCELLANEOUS) ×3 IMPLANT

## 2016-02-04 NOTE — H&P (Signed)
  The History and Physical notes are on paper, have been signed, and are to be scanned. The patient remains stable and unchanged from the H&P.   Previous H&P reviewed, patient examined, and there are no changes.  Brandon Bright 02/04/2016 10:34 AM

## 2016-02-04 NOTE — Transfer of Care (Signed)
Immediate Anesthesia Transfer of Care Note  Patient: Brandon BeagleJames W Bright  Procedure(s) Performed: Procedure(s): CATARACT EXTRACTION PHACO AND INTRAOCULAR LENS PLACEMENT (IOC) LEFT EYE (Left)  Patient Location: PACU  Anesthesia Type: MAC  Level of Consciousness: awake, alert  and patient cooperative  Airway and Oxygen Therapy: Patient Spontanous Breathing and Patient connected to supplemental oxygen  Post-op Assessment: Post-op Vital signs reviewed, Patient's Cardiovascular Status Stable, Respiratory Function Stable, Patent Airway and No signs of Nausea or vomiting  Post-op Vital Signs: Reviewed and stable  Complications: No apparent anesthesia complications

## 2016-02-04 NOTE — Op Note (Signed)
OPERATIVE NOTE  Brandon BeagleJames W Miracle [id number] 02/04/2016   PREOPERATIVE DIAGNOSIS:  Nuclear sclerotic cataract left eye. H25.12   POSTOPERATIVE DIAGNOSIS:    Nuclear sclerotic cataract left eye.     PROCEDURE:  Phacoemusification with posterior chamber intraocular lens placement of the left eye   LENS:   Implant Name Type Inv. Item Serial No. Manufacturer Lot No. LRB No. Used  LENS IOL DIOP 20.5 - [id number] Intraocular Lens LENS IOL DIOP 20.5 [id number] [facility]   Left 1        ULTRASOUND TIME: 15  % of 1 minutes 28 seconds, CDE 13.0  SURGEON:  Deirdre Evenerhadwick R. Kemarion Abbey, MD   ANESTHESIA:  Topical with tetracaine drops and 2% Xylocaine jelly, augmented with 1% preservative-free intracameral lidocaine.    COMPLICATIONS:  None.   DESCRIPTION OF PROCEDURE:  The patient was identified in the holding room and transported to the operating room and placed in the supine position under the operating microscope.  The left eye was identified as the operative eye and it was prepped and draped in the usual sterile ophthalmic fashion.   A 1 millimeter clear-corneal paracentesis was made at the 1:30 position.  0.5 ml of preservative-free 1% lidocaine was injected into the anterior chamber.  The anterior chamber was filled with Viscoat viscoelastic.  A 2.4 millimeter keratome was used to make a near-clear corneal incision at the 10:30 position.  .  A curvilinear capsulorrhexis was made with a cystotome and capsulorrhexis forceps.  Balanced salt solution was used to hydrodissect and hydrodelineate the nucleus.   Phacoemulsification was then used in stop and chop fashion to remove the lens nucleus and epinucleus.  The remaining cortex was then removed using the irrigation and aspiration handpiece. Provisc was then placed into the capsular bag to distend it for lens placement.  A lens was then injected into the capsular bag.  The remaining viscoelastic was aspirated.   Wounds were hydrated with balanced  salt solution.  The anterior chamber was inflated to a physiologic pressure with balanced salt solution.  No wound leaks were noted. Cefuroxime 0.1 ml of a 10mg /ml solution was injected into the anterior chamber for a dose of 1 mg of intracameral antibiotic at the completion of the case.   Timolol and Brimonidine drops were applied to the eye.  The patient was taken to the recovery room in stable condition without complications of anesthesia or surgery.  [name] 02/04/2016, 12:04 PM

## 2016-02-04 NOTE — Anesthesia Postprocedure Evaluation (Signed)
Anesthesia Post Note  Patient: Brandon BeagleJames W Bright  Procedure(s) Performed: Procedure(s) (LRB): CATARACT EXTRACTION PHACO AND INTRAOCULAR LENS PLACEMENT (IOC) LEFT EYE (Left)  Patient location during evaluation: PACU Anesthesia Type: MAC Level of consciousness: awake and alert Pain management: pain level controlled Vital Signs Assessment: post-procedure vital signs reviewed and stable Respiratory status: spontaneous breathing, nonlabored ventilation, respiratory function stable and patient connected to nasal cannula oxygen Cardiovascular status: blood pressure returned to baseline and stable Postop Assessment: no signs of nausea or vomiting Anesthetic complications: no    Scarlette Sliceachel B Beach

## 2016-02-04 NOTE — Anesthesia Preprocedure Evaluation (Signed)
Anesthesia Evaluation  Patient identified by MRN, date of birth, ID band Patient awake    Reviewed: Allergy & Precautions, H&P , NPO status , Patient's Chart, lab work & pertinent test results, reviewed documented beta blocker date and time   Airway Mallampati: II  TM Distance: >3 FB Neck ROM: full    Dental no notable dental hx.    Pulmonary neg pulmonary ROS,    Pulmonary exam normal breath sounds clear to auscultation       Cardiovascular Exercise Tolerance: Good hypertension,  Rhythm:regular Rate:Normal     Neuro/Psych negative neurological ROS  negative psych ROS   GI/Hepatic negative GI ROS, Neg liver ROS,   Endo/Other  negative endocrine ROS  Renal/GU negative Renal ROS  negative genitourinary   Musculoskeletal   Abdominal   Peds  Hematology negative hematology ROS (+)   Anesthesia Other Findings   Reproductive/Obstetrics negative OB ROS                             Anesthesia Physical Anesthesia Plan  ASA: II  Anesthesia Plan: MAC   Post-op Pain Management:    Induction:   Airway Management Planned:   Additional Equipment:   Intra-op Plan:   Post-operative Plan:   Informed Consent: I have reviewed the patients History and Physical, chart, labs and discussed the procedure including the risks, benefits and alternatives for the proposed anesthesia with the patient or authorized representative who has indicated his/her understanding and acceptance.   Dental Advisory Given  Plan Discussed with: CRNA  Anesthesia Plan Comments:         Anesthesia Quick Evaluation  

## 2016-02-05 ENCOUNTER — Encounter: Payer: Self-pay | Admitting: Ophthalmology

## 2016-02-27 NOTE — Discharge Instructions (Signed)

## 2016-03-03 ENCOUNTER — Ambulatory Visit: Payer: Medicare Other | Admitting: Anesthesiology

## 2016-03-03 ENCOUNTER — Encounter
Admission: RE | Disposition: A | Discharge status: Home / Self Care | Payer: Self-pay | Source: Ambulatory Visit | Attending: Ophthalmology

## 2016-03-03 ENCOUNTER — Ambulatory Visit
Admission: RE | Admit: 2016-03-03 | Discharge: 2016-03-03 | Disposition: A | Discharge status: Home / Self Care | Payer: Medicare Other | Source: Ambulatory Visit | Attending: Ophthalmology | Admitting: Ophthalmology

## 2016-03-03 DIAGNOSIS — I10 Essential (primary) hypertension: Secondary | ICD-10-CM | POA: Insufficient documentation

## 2016-03-03 DIAGNOSIS — R609 Edema, unspecified: Secondary | ICD-10-CM | POA: Diagnosis not present

## 2016-03-03 DIAGNOSIS — H2511 Age-related nuclear cataract, right eye: Secondary | ICD-10-CM | POA: Diagnosis present

## 2016-03-03 DIAGNOSIS — J310 Chronic rhinitis: Secondary | ICD-10-CM | POA: Insufficient documentation

## 2016-03-03 DIAGNOSIS — Z885 Allergy status to narcotic agent status: Secondary | ICD-10-CM | POA: Diagnosis not present

## 2016-03-03 DIAGNOSIS — Z888 Allergy status to other drugs, medicaments and biological substances status: Secondary | ICD-10-CM | POA: Diagnosis not present

## 2016-03-03 DIAGNOSIS — Z881 Allergy status to other antibiotic agents status: Secondary | ICD-10-CM | POA: Insufficient documentation

## 2016-03-03 DIAGNOSIS — E7800 Pure hypercholesterolemia, unspecified: Secondary | ICD-10-CM | POA: Diagnosis not present

## 2016-03-03 DIAGNOSIS — K654 Sclerosing mesenteritis: Secondary | ICD-10-CM | POA: Insufficient documentation

## 2016-03-03 DIAGNOSIS — I8390 Asymptomatic varicose veins of unspecified lower extremity: Secondary | ICD-10-CM | POA: Insufficient documentation

## 2016-03-03 DIAGNOSIS — M1990 Unspecified osteoarthritis, unspecified site: Secondary | ICD-10-CM | POA: Insufficient documentation

## 2016-03-03 DIAGNOSIS — Z86010 Personal history of colonic polyps: Secondary | ICD-10-CM | POA: Diagnosis not present

## 2016-03-03 HISTORY — DX: Chronic kidney disease, unspecified: N18.9

## 2016-03-03 HISTORY — DX: Anemia, unspecified: D64.9

## 2016-03-03 HISTORY — PX: CATARACT EXTRACTION W/PHACO: SHX586

## 2016-03-03 HISTORY — DX: Unspecified osteoarthritis, unspecified site: M19.90

## 2016-03-03 HISTORY — DX: Peripheral vascular disease, unspecified: I73.9

## 2016-03-03 HISTORY — DX: Polyp of colon: K63.5

## 2016-03-03 SURGERY — PHACOEMULSIFICATION, CATARACT, WITH IOL INSERTION
Anesthesia: Monitor Anesthesia Care | Laterality: Right | Wound class: Clean

## 2016-03-03 MED ORDER — LIDOCAINE HCL (PF) 4 % IJ SOLN
INTRAOCULAR | Status: DC | PRN
  Administered 2016-03-03: 1 mL via OPHTHALMIC

## 2016-03-03 MED ORDER — TIMOLOL MALEATE 0.5 % OP SOLN
OPHTHALMIC | Status: DC | PRN
  Administered 2016-03-03: 1 [drp] via OPHTHALMIC

## 2016-03-03 MED ORDER — MIDAZOLAM HCL 2 MG/2ML IJ SOLN
INTRAMUSCULAR | Status: DC | PRN
  Administered 2016-03-03: 1 mg via INTRAVENOUS

## 2016-03-03 MED ORDER — ARMC OPHTHALMIC DILATING GEL
1.0000 "application " | OPHTHALMIC | Status: DC | PRN
  Administered 2016-03-03 (×2): 1 via OPHTHALMIC

## 2016-03-03 MED ORDER — EPINEPHRINE HCL 1 MG/ML IJ SOLN
INTRAOCULAR | Status: DC | PRN
  Administered 2016-03-03: 65 mL via OPHTHALMIC

## 2016-03-03 MED ORDER — BRIMONIDINE TARTRATE 0.2 % OP SOLN
OPHTHALMIC | Status: DC | PRN
  Administered 2016-03-03: 1 [drp] via OPHTHALMIC

## 2016-03-03 MED ORDER — NA HYALUR & NA CHOND-NA HYALUR 0.4-0.35 ML IO KIT
PACK | INTRAOCULAR | Status: DC | PRN
  Administered 2016-03-03: 1 mL via INTRAOCULAR

## 2016-03-03 MED ORDER — TETRACAINE HCL 0.5 % OP SOLN
1.0000 [drp] | OPHTHALMIC | Status: DC | PRN
  Administered 2016-03-03: 1 [drp] via OPHTHALMIC

## 2016-03-03 MED ORDER — LACTATED RINGERS IV SOLN: INTRAVENOUS | Status: DC

## 2016-03-03 MED ORDER — FENTANYL CITRATE (PF) 100 MCG/2ML IJ SOLN
INTRAMUSCULAR | Status: DC | PRN
  Administered 2016-03-03: 50 ug via INTRAVENOUS

## 2016-03-03 MED ORDER — CEFUROXIME OPHTHALMIC INJECTION 1 MG/0.1 ML
INJECTION | OPHTHALMIC | Status: DC | PRN
  Administered 2016-03-03: 0.1 mL via INTRACAMERAL

## 2016-03-03 MED ORDER — POVIDONE-IODINE 5 % OP SOLN
1.0000 "application " | OPHTHALMIC | Status: DC | PRN
  Administered 2016-03-03: 1 via OPHTHALMIC

## 2016-03-03 SURGICAL SUPPLY — 25 items
CANNULA ANT/CHMB 27GA (MISCELLANEOUS) ×3 IMPLANT
CARTRIDGE ABBOTT (MISCELLANEOUS) IMPLANT
GLOVE SURG LX 7.5 STRW (GLOVE) ×2
GLOVE SURG LX STRL 7.5 STRW (GLOVE) ×1 IMPLANT
GLOVE SURG TRIUMPH 8.0 PF LTX (GLOVE) ×3 IMPLANT
GOWN STRL REUS W/ TWL LRG LVL3 (GOWN DISPOSABLE) ×2 IMPLANT
GOWN STRL REUS W/TWL LRG LVL3 (GOWN DISPOSABLE) ×4
LENS IOL TECNIS ITEC 20.5 (Intraocular Lens) ×3 IMPLANT
MARKER SKIN DUAL TIP RULER LAB (MISCELLANEOUS) ×3 IMPLANT
NDL RETROBULBAR .5 NSTRL (NEEDLE) IMPLANT
NEEDLE FILTER BLUNT 18X 1/2SAF (NEEDLE) ×2
NEEDLE FILTER BLUNT 18X1 1/2 (NEEDLE) ×1 IMPLANT
PACK CATARACT BRASINGTON (MISCELLANEOUS) ×3 IMPLANT
PACK EYE AFTER SURG (MISCELLANEOUS) ×3 IMPLANT
PACK OPTHALMIC (MISCELLANEOUS) ×3 IMPLANT
RING MALYGIN 7.0 (MISCELLANEOUS) IMPLANT
SUT ETHILON 10-0 CS-B-6CS-B-6 (SUTURE)
SUT VICRYL  9 0 (SUTURE)
SUT VICRYL 9 0 (SUTURE) IMPLANT
SUTURE EHLN 10-0 CS-B-6CS-B-6 (SUTURE) IMPLANT
SYR 3ML LL SCALE MARK (SYRINGE) ×3 IMPLANT
SYR 5ML LL (SYRINGE) ×3 IMPLANT
SYR TB 1ML LUER SLIP (SYRINGE) ×3 IMPLANT
WATER STERILE IRR 250ML POUR (IV SOLUTION) ×3 IMPLANT
WIPE NON LINTING 3.25X3.25 (MISCELLANEOUS) ×3 IMPLANT

## 2016-03-03 NOTE — Anesthesia Procedure Notes (Signed)
Procedure Name: MAC Performed by: Anissa Abbs Pre-anesthesia Checklist: Patient identified, Emergency Drugs available, Suction available, Patient being monitored and Timeout performed Patient Re-evaluated:Patient Re-evaluated prior to inductionOxygen Delivery Method: Nasal cannula       

## 2016-03-03 NOTE — H&P (Signed)
  The History and Physical notes are on paper, have been signed, and are to be scanned. The patient remains stable and unchanged from the H&P.   Previous H&P reviewed, patient examined, and there are no changes.  Brandon Bright 03/03/2016 8:10 AM

## 2016-03-03 NOTE — Anesthesia Preprocedure Evaluation (Signed)
Anesthesia Evaluation  Patient identified by MRN, date of birth, ID band Patient awake    Reviewed: Allergy & Precautions, H&P , NPO status , Patient's Chart, lab work & pertinent test results, reviewed documented beta blocker date and time   Airway Mallampati: II  TM Distance: >3 FB Neck ROM: full    Dental no notable dental hx.    Pulmonary neg pulmonary ROS,    Pulmonary exam normal breath sounds clear to auscultation       Cardiovascular Exercise Tolerance: Good hypertension, Pt. on medications  Rhythm:regular Rate:Normal     Neuro/Psych negative neurological ROS  negative psych ROS   GI/Hepatic negative GI ROS, Neg liver ROS,   Endo/Other  negative endocrine ROS  Renal/GU negative Renal ROS  negative genitourinary   Musculoskeletal  (+) Arthritis , Osteoarthritis,    Abdominal   Peds  Hematology negative hematology ROS (+)   Anesthesia Other Findings   Reproductive/Obstetrics negative OB ROS                             Anesthesia Physical Anesthesia Plan  ASA: II  Anesthesia Plan: MAC   Post-op Pain Management:    Induction:   Airway Management Planned:   Additional Equipment:   Intra-op Plan:   Post-operative Plan:   Informed Consent: I have reviewed the patients History and Physical, chart, labs and discussed the procedure including the risks, benefits and alternatives for the proposed anesthesia with the patient or authorized representative who has indicated his/her understanding and acceptance.   Dental Advisory Given  Plan Discussed with: CRNA  Anesthesia Plan Comments:         Anesthesia Quick Evaluation

## 2016-03-03 NOTE — Op Note (Signed)
LOCATION:  [facility]   PREOPERATIVE DIAGNOSIS:    Nuclear sclerotic cataract right eye. H25.11   POSTOPERATIVE DIAGNOSIS:  Nuclear sclerotic cataract right eye.     PROCEDURE:  Phacoemusification with posterior chamber intraocular lens placement of the right eye   LENS:   Implant Name Type Inv. Item Serial No. Manufacturer Lot No. LRB No. Used  LENS IOL DIOP 20.5 - [id number] Intraocular Lens LENS IOL DIOP 20.5 [id number]   Right 1        ULTRASOUND TIME: 15 % of 1 minutes, 8 seconds.  CDE 10.2   SURGEON:  Deirdre Evenerhadwick R. Denario Bagot, MD   ANESTHESIA:  Topical with tetracaine drops and 2% Xylocaine jelly, augmented with 1% preservative-free intracameral lidocaine.    COMPLICATIONS:  None.   DESCRIPTION OF PROCEDURE:  The patient was identified in the holding room and transported to the operating room and placed in the supine position under the operating microscope.  The right eye was identified as the operative eye and it was prepped and draped in the usual sterile ophthalmic fashion.   A 1 millimeter clear-corneal paracentesis was made at the 12:00 position.  0.5 ml of preservative-free 1% lidocaine was injected into the anterior chamber. The anterior chamber was filled with Viscoat viscoelastic.  A 2.4 millimeter keratome was used to make a near-clear corneal incision at the 9:00 position.  A curvilinear capsulorrhexis was made with a cystotome and capsulorrhexis forceps.  Balanced salt solution was used to hydrodissect and hydrodelineate the nucleus.   Phacoemulsification was then used in stop and chop fashion to remove the lens nucleus and epinucleus.  The remaining cortex was then removed using the irrigation and aspiration handpiece. Provisc was then placed into the capsular bag to distend it for lens placement.  A lens was then injected into the capsular bag.  The remaining viscoelastic was aspirated.   Wounds were hydrated with balanced salt solution.  The anterior  chamber was inflated to a physiologic pressure with balanced salt solution.  No wound leaks were noted. Cefuroxime 0.1 ml of a 10mg /ml solution was injected into the anterior chamber for a dose of 1 mg of intracameral antibiotic at the completion of the case.   Timolol and Brimonidine drops were applied to the eye.  The patient was taken to the recovery room in stable condition without complications of anesthesia or surgery.   Davina Howlett 03/03/2016, 8:58 AM

## 2016-03-03 NOTE — Anesthesia Postprocedure Evaluation (Signed)
Anesthesia Post Note  Patient: Brandon BeagleJames W Bright  Procedure(s) Performed: Procedure(s) (LRB): CATARACT EXTRACTION PHACO AND INTRAOCULAR LENS PLACEMENT (IOC) (Right)  Patient location during evaluation: PACU Anesthesia Type: General Level of consciousness: awake and alert Pain management: pain level controlled Vital Signs Assessment: post-procedure vital signs reviewed and stable Respiratory status: spontaneous breathing, nonlabored ventilation, respiratory function stable and patient connected to nasal cannula oxygen Cardiovascular status: blood pressure returned to baseline and stable Postop Assessment: no signs of nausea or vomiting Anesthetic complications: no    Dorene GrebeMcCulloch, Devon Pretty V

## 2016-03-03 NOTE — Transfer of Care (Signed)
Immediate Anesthesia Transfer of Care Note  Patient: Brandon BeagleJames W Bright  Procedure(s) Performed: Procedure(s): CATARACT EXTRACTION PHACO AND INTRAOCULAR LENS PLACEMENT (IOC) (Right)  Patient Location: PACU  Anesthesia Type: MAC  Level of Consciousness: awake, alert  and patient cooperative  Airway and Oxygen Therapy: Patient Spontanous Breathing and Patient connected to supplemental oxygen  Post-op Assessment: Post-op Vital signs reviewed, Patient's Cardiovascular Status Stable, Respiratory Function Stable, Patent Airway and No signs of Nausea or vomiting  Post-op Vital Signs: Reviewed and stable  Complications: No apparent anesthesia complications

## 2016-03-04 ENCOUNTER — Encounter: Payer: Self-pay | Admitting: Ophthalmology

## 2016-03-25 ENCOUNTER — Ambulatory Visit: Payer: Medicare Other | Attending: Pain Medicine | Admitting: Pain Medicine

## 2016-03-25 ENCOUNTER — Encounter: Payer: Self-pay | Admitting: Pain Medicine

## 2016-03-25 VITALS — BP 135/78 | HR 79 | Temp 97.9°F | Resp 16 | Ht 70.0 in | Wt 220.0 lb

## 2016-03-25 DIAGNOSIS — M47812 Spondylosis without myelopathy or radiculopathy, cervical region: Secondary | ICD-10-CM | POA: Diagnosis not present

## 2016-03-25 DIAGNOSIS — M5136 Other intervertebral disc degeneration, lumbar region: Secondary | ICD-10-CM | POA: Insufficient documentation

## 2016-03-25 DIAGNOSIS — M79606 Pain in leg, unspecified: Secondary | ICD-10-CM | POA: Diagnosis present

## 2016-03-25 DIAGNOSIS — M545 Low back pain: Secondary | ICD-10-CM | POA: Diagnosis present

## 2016-03-25 DIAGNOSIS — M47816 Spondylosis without myelopathy or radiculopathy, lumbar region: Secondary | ICD-10-CM

## 2016-03-25 DIAGNOSIS — M5126 Other intervertebral disc displacement, lumbar region: Secondary | ICD-10-CM | POA: Insufficient documentation

## 2016-03-25 DIAGNOSIS — M5030 Other cervical disc degeneration, unspecified cervical region: Secondary | ICD-10-CM | POA: Insufficient documentation

## 2016-03-25 DIAGNOSIS — K5790 Diverticulosis of intestine, part unspecified, without perforation or abscess without bleeding: Secondary | ICD-10-CM | POA: Diagnosis not present

## 2016-03-25 DIAGNOSIS — M4806 Spinal stenosis, lumbar region: Secondary | ICD-10-CM | POA: Diagnosis not present

## 2016-03-25 DIAGNOSIS — M5127 Other intervertebral disc displacement, lumbosacral region: Secondary | ICD-10-CM | POA: Insufficient documentation

## 2016-03-25 MED ORDER — GABAPENTIN 300 MG PO CAPS: ORAL_CAPSULE | ORAL | Status: AC

## 2016-03-25 NOTE — Patient Instructions (Addendum)
PLAN   Continue present medication Neurontin  F/U PCP Dr. Sampson GoonFitzgerald for evaliation of  BP and general medical  condition  F/U surgical evaluation. May consider pending further evaluation. Patient prefers to avoid surgical evaluation at this time  F/U neurological evaluation. May consider pending follow-up evaluations. We will avoid PNCV EMG studies and other studies at this time  May consider radiofrequency rhizolysis or intraspinal procedures pending response to present treatment and F/U evaluation. We will avoid considering such procedures at this time   Patient to call Pain Management Center should patient have concerns prior to scheduled return appointment. Pain Management Discharge Instructions  General Discharge Instructions :  If you need to reach your doctor call: Monday-Friday 8:00 am - 4:00 pm at [phone] or toll free [phone].  After clinic hours [phone] to have operator reach doctor.  Bring all of your medication bottles to all your appointments in the pain clinic.  To cancel or reschedule your appointment with Pain Management please remember to call 24 hours in advance to avoid a fee.  Refer to the educational materials which you have been given on: General Risks, I had my Procedure. Discharge Instructions, Post Sedation.  Post Procedure Instructions:  The drugs you were given will stay in your system until tomorrow, so for the next 24 hours you should not drive, make any legal decisions or drink any alcoholic beverages.  You may eat anything you prefer, but it is better to start with liquids then soups and crackers, and gradually work up to solid foods.  Please notify your doctor immediately if you have any unusual bleeding, trouble breathing or pain that is not related to your normal pain.  Depending on the type of procedure that was done, some parts of your body may feel week and/or numb.  This usually clears up by tonight or the next day.  Walk with  the use of an assistive device or accompanied by an adult for the 24 hours.  You may use ice on the affected area for the first 24 hours.  Put ice in a Ziploc bag and cover with a towel and place against area 15 minutes on 15 minutes off.  You may switch to heat after 24 hours.

## 2016-03-25 NOTE — Progress Notes (Signed)
   Subjective:    Patient ID: Brandon Bright, male    DOB: 08/07/1946, 70 y.o.   MRN: [id number]  HPI  The patient is a 70 year old gentleman who returns to pain management for further evaluation and treatment of pain involving the region of the lower back and lower extremity region with history of pain involving the cervical and upper extremity region is well. The patient denies any pain weakness of the cervical region and upper extremity region and states that he is noted some pain and weakness of the lower extremities with prolonged standing and walking. After a lengthy discussion and review of patient's prior MRI of the lumbar region decision was made to avoid interventional treatment at this time. The patient was noted to be with regions of stenosis with low central canal stenosis as well as foraminal stenosis. We explained to patient that these findings could be contributing to his symptomatology. 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 After discussion of patient's condition and informed patient that we will avoid interventional treatment at this time. We have advised patient to call should he have any return of symptoms on a more frequent basis a more severe basis and that we will consider neurosurgical evaluation as well as interventional treatment. The patient agreed to suggested treatment plan. We will continue Neurontin at this time        t.Review of Systems     Objective:   Physical Exam  There was tenderness to palpation of the paraspinal musculature region of the cervical region cervical facet region a mild degree with unremarkable Spurling's maneuver. The patient was able to perform drop test without difficulty as well. Palpation over the lumbar region was attends to palpation of mild to moderate degree with lateral bending rotation  extension and palpation of the lumbar facets reproducing mild to moderate discomfort. The patient is able to stand on tiptoes and heels without significant difficulty. Straight leg raise was tolerated to 30 without increased pain dorsiflexion noted. DTRs appeared to be 1+ and there was no sensory deficit or dermatomal distribution detected. EHL strength appeared to be equal. There was negative clonus negative Homans. Abdomen was nontender with no costovertebral angle tenderness noted      Assessment & Plan:     Degenerative disc disease lumbar spine Broad-based disc bulging L1-2 through L5-S1 with L5-S1 moderate-sized central disc protrusion pressing the ventral thecal sac with evidence of facet arthropathy as well as L4-L5 right paracentral disc component abutting the extraforaminal right L4 nerve root  Lumbar stenosis with neurogenic claudication  Degenerative disc disease cervical spine Degenerative changes multilevel involvement with C3-4 C4-5 degenerative changes especially  Diverticular disease  Cholelithiasis (history of)    PLAN   Continue present medication Neurontin  F/U PCP Dr. Sampson GoonFitzgerald for evaliation of  BP and general medical  condition  F/U surgical evaluation. May consider pending further evaluation. Patient prefers to avoid surgical evaluation at this time  F/U neurological evaluation. May consider pending follow-up evaluations. We will avoid PNCV EMG studies and other studies at this time  May consider radiofrequency rhizolysis or intraspinal procedures pending response to present treatment and F/U evaluation. We will avoid considering such procedures at this time   Patient to call Pain Management Center should patient have concerns prior to scheduled return appointment.

## 2016-03-25 NOTE — Progress Notes (Signed)
Safety precautions to be maintained throughout the outpatient stay will include: orient to surroundings, keep bed in low position, maintain call bell within reach at all times, provide assistance with transfer out of bed and ambulation.  

## 2016-03-29 ENCOUNTER — Ambulatory Visit: Payer: Medicare Other | Admitting: Pain Medicine

## 2016-06-04 ENCOUNTER — Telehealth: Payer: Self-pay | Admitting: *Deleted

## 2016-06-24 ENCOUNTER — Ambulatory Visit: Payer: Medicare Other | Admitting: Pain Medicine

## 2016-07-29 ENCOUNTER — Encounter: Payer: Self-pay | Admitting: Pain Medicine

## 2016-07-29 ENCOUNTER — Ambulatory Visit: Payer: Medicare Other | Attending: Pain Medicine | Admitting: Pain Medicine

## 2016-07-29 DIAGNOSIS — K8020 Calculus of gallbladder without cholecystitis without obstruction: Secondary | ICD-10-CM | POA: Insufficient documentation

## 2016-07-29 DIAGNOSIS — G894 Chronic pain syndrome: Secondary | ICD-10-CM | POA: Insufficient documentation

## 2016-07-29 DIAGNOSIS — I4510 Unspecified right bundle-branch block: Secondary | ICD-10-CM | POA: Insufficient documentation

## 2016-07-29 DIAGNOSIS — M5441 Lumbago with sciatica, right side: Secondary | ICD-10-CM | POA: Diagnosis not present

## 2016-07-29 DIAGNOSIS — M5126 Other intervertebral disc displacement, lumbar region: Secondary | ICD-10-CM

## 2016-07-29 DIAGNOSIS — E785 Hyperlipidemia, unspecified: Secondary | ICD-10-CM | POA: Diagnosis not present

## 2016-07-29 DIAGNOSIS — M5136 Other intervertebral disc degeneration, lumbar region: Secondary | ICD-10-CM | POA: Diagnosis not present

## 2016-07-29 DIAGNOSIS — M5030 Other cervical disc degeneration, unspecified cervical region: Secondary | ICD-10-CM | POA: Insufficient documentation

## 2016-07-29 DIAGNOSIS — M79604 Pain in right leg: Secondary | ICD-10-CM

## 2016-07-29 DIAGNOSIS — I739 Peripheral vascular disease, unspecified: Secondary | ICD-10-CM | POA: Diagnosis not present

## 2016-07-29 DIAGNOSIS — M47816 Spondylosis without myelopathy or radiculopathy, lumbar region: Secondary | ICD-10-CM

## 2016-07-29 DIAGNOSIS — N2889 Other specified disorders of kidney and ureter: Secondary | ICD-10-CM | POA: Diagnosis not present

## 2016-07-29 DIAGNOSIS — M542 Cervicalgia: Secondary | ICD-10-CM

## 2016-07-29 DIAGNOSIS — I10 Essential (primary) hypertension: Secondary | ICD-10-CM | POA: Insufficient documentation

## 2016-07-29 DIAGNOSIS — M47896 Other spondylosis, lumbar region: Secondary | ICD-10-CM | POA: Insufficient documentation

## 2016-07-29 DIAGNOSIS — D649 Anemia, unspecified: Secondary | ICD-10-CM | POA: Diagnosis not present

## 2016-07-29 DIAGNOSIS — D509 Iron deficiency anemia, unspecified: Secondary | ICD-10-CM | POA: Insufficient documentation

## 2016-07-29 DIAGNOSIS — M479 Spondylosis, unspecified: Secondary | ICD-10-CM | POA: Insufficient documentation

## 2016-07-29 DIAGNOSIS — Z7982 Long term (current) use of aspirin: Secondary | ICD-10-CM | POA: Diagnosis not present

## 2016-07-29 DIAGNOSIS — Z79899 Other long term (current) drug therapy: Secondary | ICD-10-CM | POA: Diagnosis not present

## 2016-07-29 DIAGNOSIS — G8929 Other chronic pain: Secondary | ICD-10-CM

## 2016-07-29 DIAGNOSIS — I8390 Asymptomatic varicose veins of unspecified lower extremity: Secondary | ICD-10-CM | POA: Insufficient documentation

## 2016-07-29 NOTE — Patient Instructions (Signed)
Facet Blocks Patient Information  Description: The facets are joints in the spine between the vertebrae.  Like any joints in the body, facets can become irritated and painful.  Arthritis can also effect the facets.  By injecting steroids and local anesthetic in and around these joints, we can temporarily block the nerve supply to them.  Steroids act directly on irritated nerves and tissues to reduce selling and inflammation which often leads to decreased pain.  Facet blocks may be done anywhere along the spine from the neck to the low back depending upon the location of your pain.   After numbing the skin with local anesthetic (like Novocaine), a small needle is passed onto the facet joints under x-ray guidance.  You may experience a sensation of pressure while this is being done.  The entire block usually lasts about 15-25 minutes.   Conditions which may be treated by facet blocks:   Low back/buttock pain  Neck/shoulder pain  Certain types of headaches  Preparation for the injection:  1. Do not eat any solid food or dairy products within 8 hours of your appointment. 2. You may drink clear liquid up to 3 hours before appointment.  Clear liquids include water, black coffee, juice or soda.  No milk or cream please. 3. You may take your regular medication, including pain medications, with a sip of water before your appointment.  Diabetics should hold regular insulin (if taken separately) and take 1/2 normal NPH dose the morning of the procedure.  Carry some sugar containing items with you to your appointment. 4. A driver must accompany you and be prepared to drive you home after your procedure. 5. Bring all your current medications with you. 6. An IV may be inserted and sedation may be given at the discretion of the physician. 7. A blood pressure cuff, EKG and other monitors will often be applied during the procedure.  Some patients may need to have extra oxygen administered for a short  period. 8. You will be asked to provide medical information, including your allergies and medications, prior to the procedure.  We must know immediately if you are taking blood thinners (like Coumadin/Warfarin) or if you are allergic to IV iodine contrast (dye).  We must know if you could possible be pregnant.  Possible side-effects:   Bleeding from needle site  Infection (rare, may require surgery)  Nerve injury (rare)  Numbness & tingling (temporary)  Difficulty urinating (rare, temporary)  Spinal headache (a headache worse with upright posture)  Light-headedness (temporary)  Pain at injection site (serveral days)  Decreased blood pressure (rare, temporary)  Weakness in arm/leg (temporary)  Pressure sensation in back/neck (temporary)   Call if you experience:   Fever/chills associated with headache or increased back/neck pain  Headache worsened by an upright position  New onset, weakness or numbness of an extremity below the injection site  Hives or difficulty breathing (go to the emergency room)  Inflammation or drainage at the injection site(s)  Severe back/neck pain greater than usual  New symptoms which are concerning to you  Please note:  Although the local anesthetic injected can often make your back or neck feel good for several hours after the injection, the pain will likely return. It takes 3-7 days for steroids to work.  You may not notice any pain relief for at least one week.  If effective, we will often do a series of 2-3 injections spaced 3-6 weeks apart to maximally decrease your pain.  After the initial   series, you may be a candidate for a more permanent nerve block of the facets.  If you have any questions, please call [phone] [facility] Patient Information  Description: The epidural space surrounds the nerves as they exit the spinal cord.  In some patients, the nerves can be  compressed and inflamed by a bulging disc or a tight spinal canal (spinal stenosis).  By injecting steroids into the epidural space, we can bring irritated nerves into direct contact with a potentially helpful medication.  These steroids act directly on the irritated nerves and can reduce swelling and inflammation which often leads to decreased pain.  Epidural steroids may be injected anywhere along the spine and from the neck to the low back depending upon the location of your pain.   After numbing the skin with local anesthetic (like Novocaine), a small needle is passed into the epidural space slowly.  You may experience a sensation of pressure while this is being done.  The entire block usually last less than 10 minutes.  Conditions which may be treated by epidural steroids:   Low back and leg pain  Neck and arm pain  Spinal stenosis  Post-laminectomy syndrome  Herpes zoster (shingles) pain  Pain from compression fractures  Preparation for the injection:  1. Do not eat any solid food or dairy products within 8 hours of your appointment.  2. You may drink clear liquids up to 3 hours before appointment.  Clear liquids include water, black coffee, juice or soda.  No milk or cream please. 3. You may take your regular medication, including pain medications, with a sip of water before your appointment  Diabetics should hold regular insulin (if taken separately) and take 1/2 normal NPH dos the morning of the procedure.  Carry some sugar containing items with you to your appointment. 4. A driver must accompany you and be prepared to drive you home after your procedure.  5. Bring all your current medications with your. 6. An IV may be inserted and sedation may be given at the discretion of the physician.   7. A blood pressure cuff, EKG and other monitors will often be applied during the procedure.  Some patients may need to have extra oxygen administered for a short period. 8. You will be asked  to provide medical information, including your allergies, prior to the procedure.  We must know immediately if you are taking blood thinners (like Coumadin/Warfarin)  Or if you are allergic to IV iodine contrast (dye). We must know if you could possible be pregnant.  Possible side-effects:  Bleeding from needle site  Infection (rare, may require surgery)  Nerve injury (rare)  Numbness & tingling (temporary)  Difficulty urinating (rare, temporary)  Spinal headache ( a headache worse with upright posture)  Light -headedness (temporary)  Pain at injection site (several days)  Decreased blood pressure (temporary)  Weakness in arm/leg (temporary)  Pressure sensation in back/neck (temporary)  Call if you experience:  Fever/chills associated with headache or increased back/neck pain.  Headache worsened by an upright position.  New onset weakness or numbness of an extremity below the injection site  Hives or difficulty breathing (go to the emergency room)  Inflammation or drainage at the infection site  Severe back/neck pain  Any new symptoms which are concerning to you  Please note:  Although the local anesthetic injected can often make your back or neck feel good for several hours after the injection, the pain   will likely return.  It takes 3-7 days for steroids to work in the epidural space.  You may not notice any pain relief for at least that one week.  If effective, we will often do a series of three injections spaced 3-6 weeks apart to maximally decrease your pain.  After the initial series, we generally will wait several months before considering a repeat injection of the same type.  If you have any questions, please call [phone] [facility] ClinicGENERAL RISKS AND COMPLICATIONS  What are the risk, side effects and possible complications? Generally speaking, most procedures are safe.  However, with any procedure there are risks, side  effects, and the possibility of complications.  The risks and complications are dependent upon the sites that are lesioned, or the type of nerve block to be performed.  The closer the procedure is to the spine, the more serious the risks are.  Great care is taken when placing the radio frequency needles, block needles or lesioning probes, but sometimes complications can occur. 1. Infection: Any time there is an injection through the skin, there is a risk of infection.  This is why sterile conditions are used for these blocks.  There are four possible types of infection. 1. Localized skin infection. 2. Central Nervous System Infection-This can be in the form of Meningitis, which can be deadly. 3. Epidural Infections-This can be in the form of an epidural abscess, which can cause pressure inside of the spine, causing compression of the spinal cord with subsequent paralysis. This would require an emergency surgery to decompress, and there are no guarantees that the patient would recover from the paralysis. 4. Discitis-This is an infection of the intervertebral discs.  It occurs in about 1% of discography procedures.  It is difficult to treat and it may lead to surgery.        2. Pain: the needles have to go through skin and soft tissues, will cause soreness.       3. Damage to internal structures:  The nerves to be lesioned may be near blood vessels or    other nerves which can be potentially damaged.       4. Bleeding: Bleeding is more common if the patient is taking blood thinners such as  aspirin, Coumadin, Ticiid, Plavix, etc., or if he/she have some genetic predisposition  such as hemophilia. Bleeding into the spinal canal can cause compression of the spinal  cord with subsequent paralysis.  This would require an emergency surgery to  decompress and there are no guarantees that the patient would recover from the  paralysis.       5. Pneumothorax:  Puncturing of a lung is a possibility, every time a  needle is introduced in  the area of the chest or upper back.  Pneumothorax refers to free air around the  collapsed lung(s), inside of the thoracic cavity (chest cavity).  Another two possible  complications related to a similar event would include: Hemothorax and Chylothorax.   These are variations of the Pneumothorax, where instead of air around the collapsed  lung(s), you may have blood or chyle, respectively.       6. Spinal headaches: They may occur with any procedures in the area of the spine.       7. Persistent CSF (Cerebro-Spinal Fluid) leakage: This is a rare problem, but may occur  with prolonged intrathecal or epidural catheters either due to the formation of a fistulous  track or a   dural tear.       8. Nerve damage: By working so close to the spinal cord, there is always a possibility of  nerve damage, which could be as serious as a permanent spinal cord injury with  paralysis.       9. Death:  Although rare, severe deadly allergic reactions known as "Anaphylactic  reaction" can occur to any of the medications used.      10. Worsening of the symptoms:  We can always make thing worse.  What are the chances of something like this happening? Chances of any of this occuring are extremely low.  By statistics, you have more of a chance of getting killed in a motor vehicle accident: while driving to the hospital than any of the above occurring .  Nevertheless, you should be aware that they are possibilities.  In general, it is similar to taking a shower.  Everybody knows that you can slip, hit your head and get killed.  Does that mean that you should not shower again?  Nevertheless always keep in mind that statistics do not mean anything if you happen to be on the wrong side of them.  Even if a procedure has a 1 (one) in a 1,000,000 (million) chance of going wrong, it you happen to be that one..Also, keep in mind that by statistics, you have more of a chance of having something go wrong when taking  medications.  Who should not have this procedure? If you are on a blood thinning medication (e.g. Coumadin, Plavix, see list of "Blood Thinners"), or if you have an active infection going on, you should not have the procedure.  If you are taking any blood thinners, please inform your physician.  How should I prepare for this procedure?  Do not eat or drink anything at least six hours prior to the procedure.  Bring a driver with you .  It cannot be a taxi.  Come accompanied by an adult that can drive you back, and that is strong enough to help you if your legs get weak or numb from the local anesthetic.  Take all of your medicines the morning of the procedure with just enough water to swallow them.  If you have diabetes, make sure that you are scheduled to have your procedure done first thing in the morning, whenever possible.  If you have diabetes, take only half of your insulin dose and notify our nurse that you have done so as soon as you arrive at the clinic.  If you are diabetic, but only take blood sugar pills (oral hypoglycemic), then do not take them on the morning of your procedure.  You may take them after you have had the procedure.  Do not take aspirin or any aspirin-containing medications, at least eleven (11) days prior to the procedure.  They may prolong bleeding.  Wear loose fitting clothing that may be easy to take off and that you would not mind if it got stained with Betadine or blood.  Do not wear any jewelry or perfume  Remove any nail coloring.  It will interfere with some of our monitoring equipment.  NOTE: Remember that this is not meant to be interpreted as a complete list of all possible complications.  Unforeseen problems may occur.  BLOOD THINNERS The following drugs contain aspirin or other products, which can cause increased bleeding during surgery and should not be taken for 2 weeks prior to and 1 week after surgery.  If you should   need take something for  relief of minor pain, you may take acetaminophen which is found in Tylenol,m Datril, Anacin-3 and Panadol. It is not blood thinner. The products listed below are.  Do not take any of the products listed below in addition to any listed on your instruction sheet.  A.P.C or A.P.C with Codeine Codeine Phosphate Capsules #3 Ibuprofen Ridaura  ABC compound Congesprin Imuran rimadil  Advil Cope Indocin Robaxisal  Alka-Seltzer Effervescent Pain Reliever and Antacid Coricidin or Coricidin-D  Indomethacin Rufen  Alka-Seltzer plus Cold Medicine Cosprin Ketoprofen S-A-C Tablets  Anacin Analgesic Tablets or Capsules Coumadin Korlgesic Salflex  Anacin Extra Strength Analgesic tablets or capsules CP-2 Tablets Lanoril Salicylate  Anaprox Cuprimine Capsules Levenox Salocol  Anexsia-D Dalteparin Magan Salsalate  Anodynos Darvon compound Magnesium Salicylate Sine-off  Ansaid Dasin Capsules Magsal Sodium Salicylate  Anturane Depen Capsules Marnal Soma  APF Arthritis pain formula Dewitt's Pills Measurin Stanback  Argesic Dia-Gesic Meclofenamic Sulfinpyrazone  Arthritis Bayer Timed Release Aspirin Diclofenac Meclomen Sulindac  Arthritis pain formula Anacin Dicumarol Medipren Supac  Analgesic (Safety coated) Arthralgen Diffunasal Mefanamic Suprofen  Arthritis Strength Bufferin Dihydrocodeine Mepro Compound Suprol  Arthropan liquid Dopirydamole Methcarbomol with Aspirin Synalgos  ASA tablets/Enseals Disalcid Micrainin Tagament  Ascriptin Doan's Midol Talwin  Ascriptin A/D Dolene Mobidin Tanderil  Ascriptin Extra Strength Dolobid Moblgesic Ticlid  Ascriptin with Codeine Doloprin or Doloprin with Codeine Momentum Tolectin  Asperbuf Duoprin Mono-gesic Trendar  Aspergum Duradyne Motrin or Motrin IB Triminicin  Aspirin plain, buffered or enteric coated Durasal Myochrisine Trigesic  Aspirin Suppositories Easprin Nalfon Trillsate  Aspirin with Codeine Ecotrin Regular or Extra Strength Naprosyn Uracel  Atromid-S  Efficin Naproxen Ursinus  Auranofin Capsules Elmiron Neocylate Vanquish  Axotal Emagrin Norgesic Verin  Azathioprine Empirin or Empirin with Codeine Normiflo Vitamin E  Azolid Emprazil Nuprin Voltaren  Bayer Aspirin plain, buffered or children's or timed BC Tablets or powders Encaprin Orgaran Warfarin Sodium  Buff-a-Comp Enoxaparin Orudis Zorpin  Buff-a-Comp with Codeine Equegesic Os-Cal-Gesic   Buffaprin Excedrin plain, buffered or Extra Strength Oxalid   Bufferin Arthritis Strength Feldene Oxphenbutazone   Bufferin plain or Extra Strength Feldene Capsules Oxycodone with Aspirin   Bufferin with Codeine Fenoprofen Fenoprofen Pabalate or Pabalate-SF   Buffets II Flogesic Panagesic   Buffinol plain or Extra Strength Florinal or Florinal with Codeine Panwarfarin   Buf-Tabs Flurbiprofen Penicillamine   Butalbital Compound Four-way cold tablets Penicillin   Butazolidin Fragmin Pepto-Bismol   Carbenicillin Geminisyn Percodan   Carna Arthritis Reliever Geopen Persantine   Carprofen Gold's salt Persistin   Chloramphenicol Goody's Phenylbutazone   Chloromycetin Haltrain Piroxlcam   Clmetidine heparin Plaquenil   Cllnoril Hyco-pap Ponstel   Clofibrate Hydroxy chloroquine Propoxyphen         Before stopping any of these medications, be sure to consult the physician who ordered them.  Some, such as Coumadin (Warfarin) are ordered to prevent or treat serious conditions such as "deep thrombosis", "pumonary embolisms", and other heart problems.  The amount of time that you may need off of the medication may also vary with the medication and the reason for which you were taking it.  If you are taking any of these medications, please make sure you notify your pain physician before you undergo any procedures.          

## 2016-07-29 NOTE — Progress Notes (Signed)
Safety precautions to be maintained throughout the outpatient stay will include: orient to surroundings, keep bed in low position, maintain call bell within reach at all times, provide assistance with transfer out of bed and ambulation.  

## 2016-08-05 NOTE — Progress Notes (Addendum)
Patient's Name: Brandon Bright  MRN: [id number]  Referring Provider: Leonel Ramsay, MD  DOB: 07-01-1946  PCP: Leonel Ramsay, MD  DOS: 07/29/2016  Note by: Kathlen Brunswick. Dossie Arbour, MD  Service setting: Ambulatory outpatient  Specialty: Interventional Pain Management  Location: [facility] (AMB) Pain Management Facility    Patient type: New Patient   Primary Reason(s) for Visit: Initial Patient Evaluation CC: Back Pain (right, lower)  HPI  Mr. Coor is a 70 y.o. year old, male patient, who comes today for an initial evaluation. He has DDD (degenerative disc disease), cervical; DDD (degenerative disc disease), lumbar; Facet syndrome, lumbar; Cervical facet syndrome; Cholelithiasis; Degenerative arthritis of spine; Hyperlipidemia, unspecified; Hypertension; Iron deficiency anemia; Right bundle branch block; Varicose veins of lower extremity; Chronic lower extremity pain (Location of Primary Source of Pain) (Right); Chronic pain syndrome; Lumbar spondylosis; Chronic midline low back pain with right-sided sciatica (Location of Secondary source of pain); Lumbar discogenic pain syndrome; and Chronic neck pain (Location of Tertiary source of pain) (Left) on his problem list.. His primarily concern today is the Back Pain (right, lower)  Pain Assessment: Self-Reported Pain Score: 0-No pain/10             Reported level is compatible with observation.       Pain Type: Chronic pain Pain Location: Back Pain Orientation: Right, Lower Pain Descriptors / Indicators: Dull Pain Frequency: Rarely  Onset and Duration: Gradual and Present longer than 3 months Cause of pain: Unknown Severity: NAS-11 now: 0/10 Timing: Not influenced by the time of the day Aggravating Factors: Lifiting, Prolonged standing and Walking uphill Alleviating Factors: Lying down, Nerve blocks, Resting, Sitting and Sleeping Associated Problems: Constipation, Erectile dysfunction and Fatigue Quality of Pain: Aching, Annoying and  Tender Previous Examinations or Tests: CT scan, MRI scan, Nerve block, X-rays, Nerve conduction test, Orthoperdic evaluation and Chiropractic evaluation Previous Treatments: Epidural steroid injections and Facet blocks  The patient comes into the clinics today for the first time for a chronic pain management evaluation. The patient's primary pain was described to be of the lower extremity on the right side going down the leg through the anterior portion of the leg down to the ankle. The patient indicates having had lumbar epidural steroid injections by Dr. Primus Bravo which did help him. He denies any prior surgeries and he indicates having had a total of 4 lumbar epidural steroid injections done since 2000. Second worst pain is that of the lower back and this is located in the center of the lower back. He denies any surgeries, but admits to some lumbar epidural steroid injections by Dr. Primus Bravo. The patient's third worst pain is that of the neck primarily located in the left side of the posterior aspect of the neck. He denies any prior surgeries but again admits to having had some nerve blocks by Dr. Primus Bravo.  Today I took the time to provide the patient with information regarding my pain practice. The patient was informed that my practice is divided into two sections: an interventional pain management section, as well as a completely separate and distinct medication management section. The interventional portion of my practice takes place on Tuesdays and Thursdays, while the medication management is conducted on Mondays and Wednesdays. Because of the amount of documentation required on both them, they are kept separated. This means that there is the possibility that the patient may be scheduled for a procedure on Tuesday, while also having a medication management appointment on Wednesday. I have also informed  the patient that because of current staffing and facility limitations, I no longer take patients for  medication management only. To illustrate the reasons for this, I gave the patient the example of a surgeon and how inappropriate it would be to refer a patient to his/her practice so that they write for the post-procedure antibiotics on a surgery done by someone else.   The patient was informed that joining my practice means that they are open to any and all interventional therapies. I clarified for the patient that this does not mean that they will be forced to have any procedures done. What it means is that patients looking for a practitioner to simply write for their pain medications and not take advantage of other interventional techniques will be better served by a different practitioner, other than myself. I made it clear that I prefer to spend my time providing those services that I specialize in.  The patient was also made aware of my Comprehensive Pain Management Safety Guidelines where by joining my practice, they limit all of their nerve blocks and joint injections to those done by our practice, for as long as we are retained to manage their care.   Historic Controlled Substance Pharmacotherapy Review  PMP and historical list of controlled substances: Tramadol 50 mg 1 daily (last written on 11/07/2014) MME/day: 0 mg/day Medications: The patient did not bring the medication(s) to the appointment, as requested in our "New Patient Package" Pharmacodynamics: Desired effects: Analgesia: The patient reports >50% benefit. Reported improvement in function: The patient reports medication allows him to accomplish basic ADLs. Clinically meaningful improvement in function (CMIF): Sustained CMIF goals met Perceived effectiveness: Described as relatively effective, allowing for increase in activities of daily living (ADL) Undesirable effects: Side-effects or Adverse reactions: None reported Historical Monitoring: The patient  reports that he does not use drugs.. No results found for: MDMA,  COCAINSCRNUR, PCPSCRNUR, THCU, ETH Historical Background Evaluation: [place] PDMP: Six (6) year initial data search conducted.             [place] Department of public safety, offender search: Engineer, mining Information) Non-contributory Risk Assessment Profile: Aberrant behavior: None observed or detected today Risk factors for fatal opioid overdose: None identified today Fatal overdose hazard ratio (HR): Calculation deferred Non-fatal overdose hazard ratio (HR): Calculation deferred Risk of opioid abuse or dependence: 0.7-3.0% with doses ? 36 MME/day and 6.1-26% with doses ? 120 MME/day. Substance use disorder (SUD) risk level: Pending results of Medical Psychology Evaluation for SUD Opioid risk tool (ORT) (Total Score): 0  ORT Scoring interpretation table:  Score <3 = Low Risk for SUD  Score between 4-7 = Moderate Risk for SUD  Score >8 = High Risk for Opioid Abuse   PHQ-2 Depression Scale:  Total score: 0  PHQ-2 Scoring interpretation table: (Score and probability of major depressive disorder)  Score 0 = No depression  Score 1 = 15.4% Probability  Score 2 = 21.1% Probability  Score 3 = 38.4% Probability  Score 4 = 45.5% Probability  Score 5 = 56.4% Probability  Score 6 = 78.6% Probability   PHQ-9 Depression Scale:  Total score: 0  PHQ-9 Scoring interpretation table:  Score 0-4 = No depression  Score 5-9 = Mild depression  Score 10-14 = Moderate depression  Score 15-19 = Moderately severe depression  Score 20-27 = Severe depression (2.4 times higher risk of SUD and 2.89 times higher risk of overuse)   Pharmacologic Plan: Pending ordered tests and/or consults  Meds  The patient  has a current medication list which includes the following prescription(s): aspirin, docusate sodium, gabapentin, hypertonic nasal wash, latanoprost, lisinopril-hydrochlorothiazide, icaps, multiple vitamins-minerals, and simvastatin.  Current Outpatient Prescriptions on File Prior to Visit  Medication Sig  .  aspirin 325 MG tablet Take 325 mg by mouth daily. am  . docusate sodium (COLACE) 100 MG capsule Take 100 mg by mouth 2 (two) times daily. Am and dinner  . gabapentin (NEURONTIN) 300 MG capsule Limit 2-3 tablets by mouth per day or twice per day if tolerated  . Hypertonic Nasal Wash (SINUS RINSE KIT NA) Place into the nose as needed.  . latanoprost (XALATAN) 0.005 % ophthalmic solution Place 1 drop into both eyes at bedtime.  . Multiple Vitamins-Minerals (PRESERVISION AREDS PO) Take 1 tablet by mouth 2 (two) times daily. Am and dinner  . simvastatin (ZOCOR) 20 MG tablet Take 20 mg by mouth daily. bedtime   No current facility-administered medications on file prior to visit.    Imaging Review  Lumbosacral Imaging: Lumbar MR wo contrast:  Results for orders placed in visit on 04/14/09  MR L Spine Ltd W/O Cm   Narrative * PRIOR REPORT IMPORTED FROM AN EXTERNAL SYSTEM *   PRIOR REPORT IMPORTED FROM THE SYNGO [place] EXAM:    low back pain w right heel pain  COMMENTS:   PROCEDURE:     MR  - MR LUMBAR SPINE WO CONTRAST  - Apr 14 2009  2:08PM   RESULT:     MRI LUMBAR SPINE WITHOUT CONTRAST   HISTORY: Low back pain. Right-sided pain.   COMPARISON: 03/08/2006   TECHNIQUE: Multiplanar and multisequence MRI of the lumbar spine were  obtained, without administration of IV contrast.   FINDINGS:   The vertebral bodies of the lumbar spine are normal in size and alignment.  There is normal bone marrow signal demonstrated throughout the vertebra.  There is disc desiccation with disc height loss throughout the lumbar  spine.   The spinal cord is of normal volume and contour. The cord terminates  normally at L1 . The nerve roots of the cauda equina and the filum  terminale  have the usual appearance.   The visualized portions of the SI joints are unremarkable.   The imaged intra-abdominal contents are unremarkable.   T12-L1: No significant disc bulge. No evidence of  neural foraminal or  central stenosis.   L1-L2: Mild broad-based disc bulge with a left paracentral predominance.  No  evidence of neural foraminal or central stenosis.   L2-L3: There is a mild broad-based disc bulge with a superimposed mild  left  paracentral disc protrusion impressing on the thecal sac and with mild  mass  effect upon the left intrathecal nerve roots. There is moderate spinal  stenosis. There is no significant foraminal stenosis.   L3-L4: A moderate broad-based disc bulge with flattening of the ventral  thecal sac. Moderate bilateral facet arthropathy. Moderate central canal  stenosis. There is severe right foraminal narrowing. There is no left  foraminal narrowing.   L4-L5: Moderate broad-based disc bulge with a right paracentral disc  component abutting the extraforaminal right L4 nerve root. There is severe  right foraminal narrowing. There is no significant left foraminal  narrowing.  There is mild bilateral facet arthropathy with ligamentum flavum infolding  resulting in moderate spinal stenosis.   L5-S1: There is a moderate sized central disc protrusion with impressing  on  the ventral thecal sac. No evidence of neural foraminal  or central  stenosis.   IMPRESSION:   1. Lumbar spine spondylosis as described above without significant  interval  change compared with the prior exam.       Note: Imaging results reviewed.  ROS  Cardiovascular History: Daily Aspirin intake, Hypertension and Blood thinners:  Antiplatelet Pulmonary or Respiratory History: Snoring  Neurological History: Negative for epilepsy, stroke, urinary or fecal inontinence, spina bifida or tethered cord syndrome Review of Past Neurological Studies: No results found for this or any previous visit. Psychological-Psychiatric History: Negative for anxiety, depression, schizophrenia, bipolar disorders or suicidal ideations or attempts Gastrointestinal History: Constipation Genitourinary  History: Negative for nephrolithiasis, hematuria, renal failure or chronic kidney disease Hematological History: Brusing easily Endocrine History: Negative for diabetes or thyroid disease Rheumatologic History: Osteoarthritis Musculoskeletal History: Negative for myasthenia gravis, muscular dystrophy, multiple sclerosis or malignant hyperthermia Work History: Retired  Allergies  Mr. Spray is allergic to codeine; vicodin [hydrocodone-acetaminophen]; and celebrex [celecoxib].  Laboratory Chemistry  Inflammation Markers No results found for: ESRSEDRATE, CRP Renal Function Lab Results  Component Value Date   BUN 16 08/17/2014   CREATININE 1.16 08/17/2014   GFRAA >60 08/17/2014   GFRNONAA >60 08/17/2014   Hepatic Function Lab Results  Component Value Date   AST 15 08/17/2014   ALT 20 08/17/2014   ALBUMIN 2.7 (L) 08/17/2014   Electrolytes Lab Results  Component Value Date   NA 144 08/17/2014   K 3.3 (L) 08/17/2014   CL 111 (H) 08/17/2014   CALCIUM 7.5 (L) 08/17/2014   MG 1.7 (L) 08/16/2014   Pain Modulating Vitamins No results found for: Marveen Reeks, [id number], PQ3300TM2, 25OHVITD1, 25OHVITD2, 25OHVITD3, VITAMINB12 Coagulation Parameters Lab Results  Component Value Date   PLT 163 08/17/2014   Cardiovascular Lab Results  Component Value Date   HGB 13.0 08/17/2014   HCT 38.9 (L) 08/17/2014   Note: Lab results reviewed.  PFSH  Drug: Mr. Bobier  reports that he does not use drugs. Alcohol:  reports that he does not drink alcohol. Tobacco:  reports that he has never smoked. He has never used smokeless tobacco. 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 Family: family history includes Dementia in his mother; Diabetes in his brother; Heart disease in his brother, brother, and father.  Past Surgical History:  Procedure Laterality Date  . CARDIAC CATHETERIZATION  1999?   no issue/ DR Nehemiah Massed  . CATARACT EXTRACTION W/PHACO Left 02/04/2016   Procedure: CATARACT EXTRACTION PHACO AND INTRAOCULAR LENS PLACEMENT (IOC) LEFT EYE;  Surgeon: Leandrew Koyanagi, MD;  Location: [facility];  Service: Ophthalmology;  Laterality: Left;  . CATARACT EXTRACTION W/PHACO Right 03/03/2016   Procedure: CATARACT EXTRACTION PHACO AND INTRAOCULAR LENS PLACEMENT (IOC);  Surgeon: Leandrew Koyanagi, MD;  Location: [facility];  Service: Ophthalmology;  Laterality: Right;  . COLONOSCOPY    . EYE SURGERY  May and June   bilateral cataract  . ganglion cyst removal Right   . PILONIDAL CYST EXCISION     Active Ambulatory Problems    Diagnosis Date Noted  . DDD (degenerative disc disease), cervical 05/01/2015  . DDD (degenerative disc disease), lumbar 05/01/2015  . Facet syndrome, lumbar 09/02/2015  . Cervical facet syndrome 09/02/2015  . Cholelithiasis 07/29/2016  . Degenerative arthritis of spine 07/29/2016  . Hyperlipidemia, unspecified 07/29/2016  . Hypertension 07/29/2016  .  Iron deficiency anemia 07/29/2016  . Right bundle branch block 07/29/2016  . Varicose veins of lower extremity 07/29/2016  . Chronic lower extremity pain (Location of Primary Source of Pain) (Right) 07/29/2016  . Chronic pain syndrome 07/29/2016  . Lumbar spondylosis 08/09/2016  . Chronic midline low back pain with right-sided sciatica (Location of Secondary source of pain) 08/09/2016  . 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 Chronic neck pain (Location of Tertiary source of pain) (Left) 08/09/2016   Resolved Ambulatory Problems    Diagnosis Date Noted  . AGE (acute gastroenteritis) 08/19/2014  . ARF (acute renal failure) ([place]) 08/19/2014  .  Hypokalemia 01/02/2016  . Sclerosing mesenteritis ([place]) 12/15/2015   Past Medical History:  Diagnosis Date  . AGE (acute gastroenteritis) 08/19/2014  . Anemia   . ARF (acute renal failure) ([place]) 08/19/2014  . Arthritis   . Chronic kidney disease   . Colon polyps   . Diverticulitis   . Gallstones   . Hypercholesterolemia   . Hypertension   . Hypokalemia 01/02/2016  . Peripheral vascular disease ([place])   . Sclerosing mesenteritis ([place]) 12/15/2015   Constitutional Exam  General appearance: Well nourished, well developed, and well hydrated. In no apparent acute distress Vitals:   07/29/16 1232  BP: (!) 144/6  Pulse: 71  Resp: 16  Temp: 97.8 F (36.6 C)  TempSrc: Oral  SpO2: 94%  Weight: 225 lb (102.1 kg)  Height: _0  (1.778 m)   BMI Assessment: Estimated body mass index is 32.28 kg/m as calculated from the following:   Height as of this encounter: _1  (1.778 m).   Weight as of this encounter: 225 lb (102.1 kg).  BMI interpretation table: BMI level Category Range association with higher incidence of chronic pain  <18 kg/m2 Underweight   18.5-24.9 kg/m2 Ideal body weight   25-29.9 kg/m2 Overweight Increased incidence by 20%  30-34.9 kg/m2 Obese (Class I) Increased incidence by 68%  35-39.9 kg/m2 Severe obesity (Class II) Increased incidence by 136%  >40 kg/m2 Extreme obesity (Class III) Increased incidence by 254%   BMI Readings from Last 4 Encounters:  07/29/16 32.28 kg/m  03/25/16 31.57 kg/m  03/03/16 32.14 kg/m  02/04/16 32.43 kg/m   Wt Readings from Last 4 Encounters:  07/29/16 225 lb (102.1 kg)  03/25/16 220 lb (99.8 kg)  03/03/16 224 lb (101.6 kg)  02/04/16 226 lb (102.5 kg)  Psych/Mental status: Alert, oriented x 3 (person, place, & time) Eyes: PERLA Respiratory: No evidence of acute respiratory distress  Cervical Spine Exam  Inspection: No masses, redness, or swelling Alignment: Symmetrical Functional ROM: Unrestricted ROM Stability: No  instability detected Muscle strength & Tone: Functionally intact Sensory: Unimpaired Palpation: Non-contributory  Upper Extremity (UE) Exam    Side: Right upper extremity  Side: Left upper extremity  Inspection: No masses, redness, swelling, or asymmetry  Inspection: No masses, redness, swelling, or asymmetry  Functional ROM: Unrestricted ROM         Functional ROM: Unrestricted ROM          Muscle strength & Tone: Functionally intact  Muscle strength & Tone: Functionally intact  Sensory: Unimpaired  Sensory: Unimpaired  Palpation: Non-contributory  Palpation: Non-contributory   Thoracic Spine Exam  Inspection: No masses, redness, or swelling Alignment: Symmetrical Functional ROM: Unrestricted ROM Stability: No instability detected Sensory: Unimpaired Muscle strength & Tone: Functionally intact Palpation: Non-contributory  Lumbar Spine Exam  Inspection: No masses, redness, or swelling Alignment: Symmetrical Functional ROM: Unrestricted ROM Stability: No instability detected Muscle  strength & Tone: Functionally intact Sensory: Unimpaired Palpation: Non-contributory Provocative Tests: Lumbar Hyperextension and rotation test: evaluation deferred today       Patrick's Maneuver: evaluation deferred today              Gait & Posture Assessment  Ambulation: Unassisted Gait: Relatively normal for age and body habitus Posture: WNL   Lower Extremity Exam    Side: Right lower extremity  Side: Left lower extremity  Inspection: No masses, redness, swelling, or asymmetry  Inspection: No masses, redness, swelling, or asymmetry  Functional ROM: Unrestricted ROM          Functional ROM: Unrestricted ROM          Muscle strength & Tone: Functionally intact  Muscle strength & Tone: Functionally intact  Sensory: Unimpaired  Sensory: Unimpaired  Palpation: Non-contributory  Palpation: Non-contributory   Assessment  Primary Diagnosis & Pertinent Problem List: Diagnoses of Chronic pain of  right lower extremity, Chronic pain syndrome, Lumbar spondylosis, Chronic midline low back pain with right-sided sciatica, Lumbar discogenic pain syndrome, and Chronic neck pain (Location of Tertiary source of pain) (Left) were pertinent to this visit.  Visit Diagnosis: 1. Chronic pain of right lower extremity   2. Chronic pain syndrome   3. Lumbar spondylosis   4. Chronic midline low back pain with right-sided sciatica   5. Lumbar discogenic pain syndrome   6. Chronic neck pain (Location of Tertiary source of pain) (Left)    Plan of Care  Initial treatment plan:  Please be advised that as per protocol, today's visit has been an evaluation only. We have not taken over the patient's controlled substance management.  Problem-specific plan: No problem-specific Assessment & Plan notes found for this encounter.  Ordered Lab-work, Procedure(s), Referral(s), & Consult(s): No orders of the defined types were placed in this encounter.  Pharmacotherapy: Medications ordered:  No orders of the defined types were placed in this encounter.  Medications administered during this visit: Mr. Byas had no medications administered during this visit.   Pharmacotherapy under consideration:  Opioid Analgesics: The patient was informed that there is no guarantee that he would be a candidate for opioid analgesics. The decision will be made following CDC guidelines. This decision will be based on the results of diagnostic studies, as well as Mr. Bagnall's risk profile.  Membrane stabilizer: To be determined at a later time Muscle relaxant: To be determined at a later time NSAID: To be determined at a later time Non-opioid analgesic: To be determined at a later time   Interventional therapies under consideration: Mr. Mazzuca was informed that there is no guarantee that he would be a candidate for interventional therapies. The decision will be based on the results of diagnostic studies, as well as Mr.  Tomasello's risk profile.  Possible procedure(s): Palliative right-sided lumbar epidural steroid injection under fluoroscopic guidance    Provider-requested follow-up: Return for (PRN) Procedure.  Future Appointments Date Time Provider [place]  07/19/2017 8:00 AM Milinda Pointer, MD [facility] None    Primary Care Physician: Leonel Ramsay, MD Location: [facility] Outpatient Pain Management Facility Note by: Kathlen Brunswick. Dossie Arbour, M.D, DABA, DABAPM, DABPM, DABIPP, FIPP  Pain Score Disclaimer: We use the NRS-11 scale. This is a self-reported, subjective measurement of pain severity with only modest accuracy. It is used primarily to identify changes within a particular patient. It must be understood that outpatient pain scales are significantly less accurate that those used for research, where they can be applied under ideal controlled  circumstances with minimal exposure to variables. In reality, the score is likely to be a combination of pain intensity and pain affect, where pain affect describes the degree of emotional arousal or changes in action readiness caused by the sensory experience of pain. Factors such as social and work situation, setting, emotional state, anxiety levels, expectation, and prior pain experience may influence pain perception and show large inter-individual differences that may also be affected by time variables.  Patient instructions provided during this appointment: Patient Instructions   Facet Blocks Patient Information  Description: The facets are joints in the spine between the vertebrae.  Like any joints in the body, facets can become irritated and painful.  Arthritis can also effect the facets.  By injecting steroids and local anesthetic in and around these joints, we can temporarily block the nerve supply to them.  Steroids act directly on irritated nerves and tissues to reduce selling and inflammation which often leads to decreased pain.  Facet blocks  may be done anywhere along the spine from the neck to the low back depending upon the location of your pain.   After numbing the skin with local anesthetic (like Novocaine), a small needle is passed onto the facet joints under x-ray guidance.  You may experience a sensation of pressure while this is being done.  The entire block usually lasts about 15-25 minutes.   Conditions which may be treated by facet blocks:   Low back/buttock pain  Neck/shoulder pain  Certain types of headaches  Preparation for the injection:  1. Do not eat any solid food or dairy products within 8 hours of your appointment. 2. You may drink clear liquid up to 3 hours before appointment.  Clear liquids include water, black coffee, juice or soda.  No milk or cream please. 3. You may take your regular medication, including pain medications, with a sip of water before your appointment.  Diabetics should hold regular insulin (if taken separately) and take 1/2 normal NPH dose the morning of the procedure.  Carry some sugar containing items with you to your appointment. 4. A driver must accompany you and be prepared to drive you home after your procedure. 5. Bring all your current medications with you. 6. An IV may be inserted and sedation may be given at the discretion of the physician. 7. A blood pressure cuff, EKG and other monitors will often be applied during the procedure.  Some patients may need to have extra oxygen administered for a short period. 8. You will be asked to provide medical information, including your allergies and medications, prior to the procedure.  We must know immediately if you are taking blood thinners (like Coumadin/Warfarin) or if you are allergic to IV iodine contrast (dye).  We must know if you could possible be pregnant.  Possible side-effects:   Bleeding from needle site  Infection (rare, may require surgery)  Nerve injury (rare)  Numbness & tingling (temporary)  Difficulty  urinating (rare, temporary)  Spinal headache (a headache worse with upright posture)  Light-headedness (temporary)  Pain at injection site (serveral days)  Decreased blood pressure (rare, temporary)  Weakness in arm/leg (temporary)  Pressure sensation in back/neck (temporary)   Call if you experience:   Fever/chills associated with headache or increased back/neck pain  Headache worsened by an upright position  New onset, weakness or numbness of an extremity below the injection site  Hives or difficulty breathing (go to the emergency room)  Inflammation or drainage at the injection site(s)  Severe back/neck pain greater than usual  New symptoms which are concerning to you  Please note:  Although the local anesthetic injected can often make your back or neck feel good for several hours after the injection, the pain will likely return. It takes 3-7 days for steroids to work.  You may not notice any pain relief for at least one week.  If effective, we will often do a series of 2-3 injections spaced 3-6 weeks apart to maximally decrease your pain.  After the initial series, you may be a candidate for a more permanent nerve block of the facets.  If you have any questions, please call #[phone]) [facility] Patient Information  Description: The epidural space surrounds the nerves as they exit the spinal cord.  In some patients, the nerves can be compressed and inflamed by a bulging disc or a tight spinal canal (spinal stenosis).  By injecting steroids into the epidural space, we can bring irritated nerves into direct contact with a potentially helpful medication.  These steroids act directly on the irritated nerves and can reduce swelling and inflammation which often leads to decreased pain.  Epidural steroids may be injected anywhere along the spine and from the neck to the low back depending upon the location of your  pain.   After numbing the skin with local anesthetic (like Novocaine), a small needle is passed into the epidural space slowly.  You may experience a sensation of pressure while this is being done.  The entire block usually last less than 10 minutes.  Conditions which may be treated by epidural steroids:   Low back and leg pain  Neck and arm pain  Spinal stenosis  Post-laminectomy syndrome  Herpes zoster (shingles) pain  Pain from compression fractures  Preparation for the injection:  1. Do not eat any solid food or dairy products within 8 hours of your appointment.  2. You may drink clear liquids up to 3 hours before appointment.  Clear liquids include water, black coffee, juice or soda.  No milk or cream please. 3. You may take your regular medication, including pain medications, with a sip of water before your appointment  Diabetics should hold regular insulin (if taken separately) and take 1/2 normal NPH dos the morning of the procedure.  Carry some sugar containing items with you to your appointment. 4. A driver must accompany you and be prepared to drive you home after your procedure.  5. Bring all your current medications with your. 6. An IV may be inserted and sedation may be given at the discretion of the physician.   7. A blood pressure cuff, EKG and other monitors will often be applied during the procedure.  Some patients may need to have extra oxygen administered for a short period. 8. You will be asked to provide medical information, including your allergies, prior to the procedure.  We must know immediately if you are taking blood thinners (like Coumadin/Warfarin)  Or if you are allergic to IV iodine contrast (dye). We must know if you could possible be pregnant.  Possible side-effects:  Bleeding from needle site  Infection (rare, may require surgery)  Nerve injury (rare)  Numbness & tingling (temporary)  Difficulty urinating (rare, temporary)  Spinal headache (  a headache worse with upright posture)  Light -headedness (temporary)  Pain at injection site (several days)  Decreased blood pressure (temporary)  Weakness in arm/leg (temporary)  Pressure sensation in back/neck (temporary)  Call  if you experience:  Fever/chills associated with headache or increased back/neck pain.  Headache worsened by an upright position.  New onset weakness or numbness of an extremity below the injection site  Hives or difficulty breathing (go to the emergency room)  Inflammation or drainage at the infection site  Severe back/neck pain  Any new symptoms which are concerning to you  Please note:  Although the local anesthetic injected can often make your back or neck feel good for several hours after the injection, the pain will likely return.  It takes 3-7 days for steroids to work in the epidural space.  You may not notice any pain relief for at least that one week.  If effective, we will often do a series of three injections spaced 3-6 weeks apart to maximally decrease your pain.  After the initial series, we generally will wait several months before considering a repeat injection of the same type.  If you have any questions, please call [phone] [place]  What are the risk, side effects and possible complications? Generally speaking, most procedures are safe.  However, with any procedure there are risks, side effects, and the possibility of complications.  The risks and complications are dependent upon the sites that are lesioned, or the type of nerve block to be performed.  The closer the procedure is to the spine, the more serious the risks are.  Great care is taken when placing the radio frequency needles, block needles or lesioning probes, but sometimes complications can occur. 1. Infection: Any time there is an injection through the skin, there is a risk of infection.  This is  why sterile conditions are used for these blocks.  There are four possible types of infection. 1. Localized skin infection. 2. Central Nervous System Infection-This can be in the form of Meningitis, which can be deadly. 3. Epidural Infections-This can be in the form of an epidural abscess, which can cause pressure inside of the spine, causing compression of the spinal cord with subsequent paralysis. This would require an emergency surgery to decompress, and there are no guarantees that the patient would recover from the paralysis. 4. Discitis-This is an infection of the intervertebral discs.  It occurs in about 1% of discography procedures.  It is difficult to treat and it may lead to surgery.        2. Pain: the needles have to go through skin and soft tissues, will cause soreness.       3. Damage to internal structures:  The nerves to be lesioned may be near blood vessels or    other nerves which can be potentially damaged.       4. Bleeding: Bleeding is more common if the patient is taking blood thinners such as  aspirin, Coumadin, Ticiid, Plavix, etc., or if he/she have some genetic predisposition  such as hemophilia. Bleeding into the spinal canal can cause compression of the spinal  cord with subsequent paralysis.  This would require an emergency surgery to  decompress and there are no guarantees that the patient would recover from the  paralysis.       5. Pneumothorax:  Puncturing of a lung is a possibility, every time a needle is introduced in  the area of the chest or upper back.  Pneumothorax refers to free air around the  collapsed lung(s), inside of the thoracic cavity (chest cavity).  Another two possible  complications related to a similar event would  include: Hemothorax and Chylothorax.   These are variations of the Pneumothorax, where instead of air around the collapsed  lung(s), you may have blood or chyle, respectively.       6. Spinal headaches: They may occur with any procedures in the  area of the spine.       7. Persistent CSF (Cerebro-Spinal Fluid) leakage: This is a rare problem, but may occur  with prolonged intrathecal or epidural catheters either due to the formation of a fistulous  track or a dural tear.       8. Nerve damage: By working so close to the spinal cord, there is always a possibility of  nerve damage, which could be as serious as a permanent spinal cord injury with  paralysis.       9. Death:  Although rare, severe deadly allergic reactions known as "Anaphylactic  reaction" can occur to any of the medications used.      10. Worsening of the symptoms:  We can always make thing worse.  What are the chances of something like this happening? Chances of any of this occuring are extremely low.  By statistics, you have more of a chance of getting killed in a motor vehicle accident: while driving to the hospital than any of the above occurring .  Nevertheless, you should be aware that they are possibilities.  In general, it is similar to taking a shower.  Everybody knows that you can slip, hit your head and get killed.  Does that mean that you should not shower again?  Nevertheless always keep in mind that statistics do not mean anything if you happen to be on the wrong side of them.  Even if a procedure has a 1 (one) in a 1,000,000 (million) chance of going wrong, it you happen to be that one..Also, keep in mind that by statistics, you have more of a chance of having something go wrong when taking medications.  Who should not have this procedure? If you are on a blood thinning medication (e.g. Coumadin, Plavix, see list of "Blood Thinners"), or if you have an active infection going on, you should not have the procedure.  If you are taking any blood thinners, please inform your physician.  How should I prepare for this procedure?  Do not eat or drink anything at least six hours prior to the procedure.  Bring a driver with you .  It cannot be a taxi.  Come accompanied  by an adult that can drive you back, and that is strong enough to help you if your legs get weak or numb from the local anesthetic.  Take all of your medicines the morning of the procedure with just enough water to swallow them.  If you have diabetes, make sure that you are scheduled to have your procedure done first thing in the morning, whenever possible.  If you have diabetes, take only half of your insulin dose and notify our nurse that you have done so as soon as you arrive at the clinic.  If you are diabetic, but only take blood sugar pills (oral hypoglycemic), then do not take them on the morning of your procedure.  You may take them after you have had the procedure.  Do not take aspirin or any aspirin-containing medications, at least eleven (11) days prior to the procedure.  They may prolong bleeding.  Wear loose fitting clothing that may be easy to take off and that you would not mind if it got  stained with Betadine or blood.  Do not wear any jewelry or perfume  Remove any nail coloring.  It will interfere with some of our monitoring equipment.  NOTE: Remember that this is not meant to be interpreted as a complete list of all possible complications.  Unforeseen problems may occur.  BLOOD THINNERS The following drugs contain aspirin or other products, which can cause increased bleeding during surgery and should not be taken for 2 weeks prior to and 1 week after surgery.  If you should need take something for relief of minor pain, you may take acetaminophen which is found in Tylenol,m Datril, Anacin-3 and Panadol. It is not blood thinner. The products listed below are.  Do not take any of the products listed below in addition to any listed on your instruction sheet.  A.P.C or A.P.C with Codeine Codeine Phosphate Capsules #3 Ibuprofen Ridaura  ABC compound Congesprin Imuran rimadil  Advil Cope Indocin Robaxisal  Alka-Seltzer Effervescent Pain Reliever and Antacid Coricidin or  Coricidin-D  Indomethacin Rufen  Alka-Seltzer plus Cold Medicine Cosprin Ketoprofen S-A-C Tablets  Anacin Analgesic Tablets or Capsules Coumadin Korlgesic Salflex  Anacin Extra Strength Analgesic tablets or capsules CP-2 Tablets Lanoril Salicylate  Anaprox Cuprimine Capsules Levenox Salocol  Anexsia-D Dalteparin Magan Salsalate  Anodynos Darvon compound Magnesium Salicylate Sine-off  Ansaid Dasin Capsules Magsal Sodium Salicylate  Anturane Depen Capsules Marnal Soma  APF Arthritis pain formula Dewitt's Pills Measurin Stanback  Argesic Dia-Gesic Meclofenamic Sulfinpyrazone  Arthritis Bayer Timed Release Aspirin Diclofenac Meclomen Sulindac  Arthritis pain formula Anacin Dicumarol Medipren Supac  Analgesic (Safety coated) Arthralgen Diffunasal Mefanamic Suprofen  Arthritis Strength Bufferin Dihydrocodeine Mepro Compound Suprol  Arthropan liquid Dopirydamole Methcarbomol with Aspirin Synalgos  ASA tablets/Enseals Disalcid Micrainin Tagament  Ascriptin Doan's Midol Talwin  Ascriptin A/D Dolene Mobidin Tanderil  Ascriptin Extra Strength Dolobid Moblgesic Ticlid  Ascriptin with Codeine Doloprin or Doloprin with Codeine Momentum Tolectin  Asperbuf Duoprin Mono-gesic Trendar  Aspergum Duradyne Motrin or Motrin IB Triminicin  Aspirin plain, buffered or enteric coated Durasal Myochrisine Trigesic  Aspirin Suppositories Easprin Nalfon Trillsate  Aspirin with Codeine Ecotrin Regular or Extra Strength Naprosyn Uracel  Atromid-S Efficin Naproxen Ursinus  Auranofin Capsules Elmiron Neocylate Vanquish  Axotal Emagrin Norgesic Verin  Azathioprine Empirin or Empirin with Codeine Normiflo Vitamin E  Azolid Emprazil Nuprin Voltaren  Bayer Aspirin plain, buffered or children's or timed BC Tablets or powders Encaprin Orgaran Warfarin Sodium  Buff-a-Comp Enoxaparin Orudis Zorpin  Buff-a-Comp with Codeine Equegesic Os-Cal-Gesic   Buffaprin Excedrin plain, buffered or Extra Strength Oxalid   Bufferin  Arthritis Strength Feldene Oxphenbutazone   Bufferin plain or Extra Strength Feldene Capsules Oxycodone with Aspirin   Bufferin with Codeine Fenoprofen Fenoprofen Pabalate or Pabalate-SF   Buffets II Flogesic Panagesic   Buffinol plain or Extra Strength Florinal or Florinal with Codeine Panwarfarin   Buf-Tabs Flurbiprofen Penicillamine   Butalbital Compound Four-way cold tablets Penicillin   Butazolidin Fragmin Pepto-Bismol   Carbenicillin Geminisyn Percodan   Carna Arthritis Reliever Geopen Persantine   Carprofen Gold's salt Persistin   Chloramphenicol Goody's Phenylbutazone   Chloromycetin Haltrain Piroxlcam   Clmetidine heparin Plaquenil   Cllnoril Hyco-pap Ponstel   Clofibrate Hydroxy chloroquine Propoxyphen         Before stopping any of these medications, be sure to consult the physician who ordered them.  Some, such as Coumadin (Warfarin) are ordered to prevent or treat serious conditions such as "deep thrombosis", "pumonary embolisms", and other heart problems.  The amount of time that  you may need off of the medication may also vary with the medication and the reason for which you were taking it.  If you are taking any of these medications, please make sure you notify your pain physician before you undergo any procedures.

## 2016-08-09 DIAGNOSIS — M5126 Other intervertebral disc displacement, lumbar region: Secondary | ICD-10-CM | POA: Insufficient documentation

## 2016-08-09 DIAGNOSIS — M542 Cervicalgia: Secondary | ICD-10-CM

## 2016-08-09 DIAGNOSIS — G8929 Other chronic pain: Secondary | ICD-10-CM | POA: Insufficient documentation

## 2016-08-09 DIAGNOSIS — M47816 Spondylosis without myelopathy or radiculopathy, lumbar region: Secondary | ICD-10-CM | POA: Insufficient documentation

## 2016-08-09 DIAGNOSIS — M5136 Other intervertebral disc degeneration, lumbar region with discogenic back pain only: Secondary | ICD-10-CM | POA: Insufficient documentation

## 2016-08-09 DIAGNOSIS — M5441 Lumbago with sciatica, right side: Secondary | ICD-10-CM

## 2016-10-19 ENCOUNTER — Ambulatory Visit: Payer: Medicare Other | Admitting: Pain Medicine

## 2016-12-28 ENCOUNTER — Ambulatory Visit
Admission: EM | Admit: 2016-12-28 | Discharge: 2016-12-28 | Disposition: A | Discharge status: Home / Self Care | Payer: Medicare Other | Attending: Family Medicine | Admitting: Family Medicine

## 2016-12-28 DIAGNOSIS — S80869A Insect bite (nonvenomous), unspecified lower leg, initial encounter: Secondary | ICD-10-CM | POA: Diagnosis not present

## 2016-12-28 DIAGNOSIS — W57XXXA Bitten or stung by nonvenomous insect and other nonvenomous arthropods, initial encounter: Secondary | ICD-10-CM

## 2016-12-28 MED ORDER — DOXYCYCLINE HYCLATE 100 MG PO TABS: 100.0000 mg | ORAL_TABLET | Freq: Two times a day (BID) | ORAL | 0 refills | Status: DC

## 2016-12-28 NOTE — ED Triage Notes (Addendum)
Pt has been outside working in the yard and noticed he has a couple ticks on his right hip. His wife did remove one earlier.

## 2016-12-28 NOTE — ED Provider Notes (Signed)
[facility]    CSN: [id number] Arrival date & time: 12/28/16  1019     History   Chief Complaint Chief Complaint  Patient presents with  . Tick Removal    HPI Brandon Bright is a 71 y.o. male.   71 yo male with a c/o multiple tick bites that occurred 2-3 days ago, over the weekend. Denies any fevers, chills. States ticks were removed and were not engorged.    The history is provided by the patient.    Past Medical History:  Diagnosis Date  . AGE (acute gastroenteritis) 08/19/2014  . Anemia    hx of  . ARF (acute renal failure) ([place]) 08/19/2014  . 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 Chronic kidney disease    acute renal failue?  . Colon polyps   . Diverticulitis   . Gallstones   . Hypercholesterolemia   . Hypertension    controlled on meds  . Hypokalemia 01/02/2016  . Peripheral vascular disease (HCC)    varicose veins  . 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 Lumbar spondylosis 08/09/2016  . Chronic midline low back pain with right-sided sciatica (Location of Secondary source of pain) 08/09/2016  . 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 Chronic neck pain (Location of Tertiary source of pain) (Left) 08/09/2016  . Cholelithiasis 07/29/2016  . Degenerative arthritis of spine 07/29/2016  . Hyperlipidemia, unspecified 07/29/2016  . Hypertension 07/29/2016  . Iron deficiency anemia 07/29/2016  . Right bundle branch block 07/29/2016  . Varicose veins of lower extremity 07/29/2016  . Chronic lower extremity pain (Location of Primary Source of Pain) (Right) 07/29/2016  . Chronic pain syndrome 07/29/2016  . Facet syndrome, lumbar 09/02/2015  . Cervical facet syndrome 09/02/2015  . DDD (degenerative disc disease), cervical 05/01/2015  . DDD (degenerative disc disease), lumbar 05/01/2015    Past Surgical History:  Procedure Laterality Date  . CARDIAC CATHETERIZATION  1999?   no  issue/ DR Nehemiah Massed  . CATARACT EXTRACTION W/PHACO Left 02/04/2016   Procedure: CATARACT EXTRACTION PHACO AND INTRAOCULAR LENS PLACEMENT (IOC) LEFT EYE;  Surgeon: Leandrew Koyanagi, MD;  Location: [facility];  Service: Ophthalmology;  Laterality: Left;  . CATARACT EXTRACTION W/PHACO Right 03/03/2016   Procedure: CATARACT EXTRACTION PHACO AND INTRAOCULAR LENS PLACEMENT (IOC);  Surgeon: Leandrew Koyanagi, MD;  Location: [facility];  Service: Ophthalmology;  Laterality: Right;  . COLONOSCOPY    . EYE SURGERY  May and June   bilateral cataract  . ganglion cyst removal Right   . PILONIDAL CYST EXCISION         Home Medications    Prior to Admission medications   Medication Sig Start Date End Date Taking? Authorizing Provider  aspirin 325 MG tablet Take 325 mg by mouth daily. am   Yes Historical Provider, MD  docusate sodium (COLACE) 100 MG capsule Take 100 mg by mouth 2 (two) times daily. Am and dinner   Yes Historical Provider, MD  gabapentin (NEURONTIN) 300 MG capsule Limit 2-3 tablets by mouth per day or twice per day if tolerated 03/25/16  Yes Mohammed Kindle, MD  Hypertonic Nasal Wash (SINUS RINSE KIT NA) Place into the nose as needed.   Yes Historical Provider, MD  latanoprost (XALATAN) 0.005 % ophthalmic solution Place 1 drop into both eyes at bedtime.   Yes Historical Provider, MD  lisinopril-hydrochlorothiazide (PRINZIDE,ZESTORETIC) 20-25 MG tablet TAKE ONE TABLET BY MOUTH  DAILY. 07/19/16  Yes Historical Provider, MD  Multiple Vitamins-Minerals (PRESERVISION AREDS PO) Take 1 tablet by mouth 2 (two) times daily. Am and dinner   Yes Historical Provider, MD  simvastatin (ZOCOR) 20 MG tablet Take 20 mg by mouth daily. bedtime   Yes Historical Provider, MD  doxycycline (VIBRA-TABS) 100 MG tablet Take 1 tablet (100 mg total) by mouth 2 (two) times daily. 12/28/16   Norval Gable, MD  Multiple Vitamins-Minerals (ICAPS) CAPS Take 1 capsule by mouth daily.     Historical Provider,  MD    Family History Family History  Problem Relation Age of Onset  . Heart disease Father   . Dementia Mother   . Heart disease Brother   . Heart disease Brother   . Diabetes Brother     Social History Social History  Substance Use Topics  . Smoking status: Never Smoker  . Smokeless tobacco: Never Used  . Alcohol use No     Allergies   Codeine; Vicodin [hydrocodone-acetaminophen]; and Celebrex [celecoxib]   Review of Systems Review of Systems   Physical Exam Triage Vital Signs ED Triage Vitals  Enc Vitals Group     BP 12/28/16 1044 124/72     Pulse Rate 12/28/16 1044 97     Resp 12/28/16 1044 18     Temp 12/28/16 1044 98 F (36.7 C)     Temp Source 12/28/16 1044 Oral     SpO2 12/28/16 1044 99 %     Weight 12/28/16 1046 220 lb (99.8 kg)     Height 12/28/16 1046 '5\' 10"'  (1.778 m)     Head Circumference --      Peak Flow --      Pain Score 12/28/16 1046 0     Pain Loc --      Pain Edu? --      Excl. in [place]? --    No data found.   Updated Vital Signs BP 124/72 (BP Location: Left Arm)   Pulse 97   Temp 98 F (36.7 C) (Oral)   Resp 18   Ht '5\' 10"'  (1.778 m)   Wt 220 lb (99.8 kg)   SpO2 99%   BMI 31.57 kg/m   Visual Acuity Right Eye Distance:   Left Eye Distance:   Bilateral Distance:    Right Eye Near:   Left Eye Near:    Bilateral Near:     Physical Exam  Constitutional: He appears well-developed and well-nourished. No distress.  Skin: He is not diaphoretic.  Multiple erythematous, dime to nickel sized skin lesions with central puncture wounds on the lower extremities  Nursing note and vitals reviewed.    UC Treatments / Results  Labs (all labs ordered are listed, but only abnormal results are displayed) Labs Reviewed - No data to display  EKG  EKG Interpretation None       Radiology No results found.  Procedures Procedures (including critical care time)  Medications Ordered in UC Medications - No data to  display   Initial Impression / Assessment and Plan / UC Course  I have reviewed the triage vital signs and the nursing notes.  Pertinent labs & imaging results that were available during my care of the patient were reviewed by me and considered in my medical decision making (see chart for details).      Final Clinical Impressions(s) / UC Diagnoses   Final diagnoses:  Tick bite, initial encounter    New Prescriptions Discharge Medication List as of 12/28/2016  11:30 AM    START taking these medications   Details  doxycycline (VIBRA-TABS) 100 MG tablet Take 1 tablet (100 mg total) by mouth 2 (two) times daily., Starting Tue 12/28/2016, Normal       1. diagnosis reviewed with patient 2. rx as per orders above; reviewed possible side effects, interactions, risks and benefits  3. Follow-up prn if symptoms worsen or don't improve   Norval Gable, MD 12/28/16 1220

## 2017-05-16 ENCOUNTER — Encounter: Payer: Self-pay | Admitting: Emergency Medicine

## 2017-05-16 ENCOUNTER — Ambulatory Visit
Admission: EM | Admit: 2017-05-16 | Discharge: 2017-05-16 | Disposition: A | Discharge status: Home / Self Care | Payer: Medicare Other | Attending: Family Medicine | Admitting: Family Medicine

## 2017-05-16 DIAGNOSIS — W260XXA Contact with knife, initial encounter: Secondary | ICD-10-CM | POA: Diagnosis not present

## 2017-05-16 DIAGNOSIS — T148XXA Other injury of unspecified body region, initial encounter: Secondary | ICD-10-CM | POA: Diagnosis not present

## 2017-05-16 DIAGNOSIS — S61201A Unspecified open wound of left index finger without damage to nail, initial encounter: Secondary | ICD-10-CM

## 2017-05-16 DIAGNOSIS — S6992XA Unspecified injury of left wrist, hand and finger(s), initial encounter: Secondary | ICD-10-CM

## 2017-05-16 DIAGNOSIS — Z23 Encounter for immunization: Secondary | ICD-10-CM

## 2017-05-16 MED ORDER — TETANUS-DIPHTHERIA TOXOIDS TD 5-2 LFU IM INJ
0.5000 mL | INJECTION | Freq: Once | INTRAMUSCULAR | Status: AC
  Administered 2017-05-16: 0.5 mL via INTRAMUSCULAR

## 2017-05-16 MED ORDER — MUPIROCIN 2 % EX OINT: TOPICAL_OINTMENT | CUTANEOUS | 0 refills | Status: DC

## 2017-05-16 MED ORDER — CEPHALEXIN 500 MG PO CAPS: 500.0000 mg | ORAL_CAPSULE | Freq: Three times a day (TID) | ORAL | 0 refills | Status: AC

## 2017-05-16 NOTE — ED Triage Notes (Signed)
Patient states that he cut a small piece of his skin out on his left 2nd finger with a pocket knife today.

## 2017-05-16 NOTE — Discharge Instructions (Signed)
Take medication as prescribed. Drink plenty of fluids. Keep clean and dry as discussed. Monitor closely.  Follow up with your primary care physician this week as scheduled. Return to Urgent care for redness, swelling, pain, new or worsening concerns.

## 2017-05-16 NOTE — ED Provider Notes (Signed)
Brandon Bright ____________________________________________  Time seen: Approximately 12:21 PM  I have reviewed the triage vital signs and the nursing notes.   HISTORY  Chief Complaint Extremity Laceration  HPI Brandon Bright is a 71 y.o. male present for evaluation of laceration injury to left index finger that occurred just prior to arrival. Patient reports he was outside working and then tried to cut open a nut from a tree with a pocket knife, and his hand slipped cutting the top of the skin on his index finger. Denies fall or direct injury. States full range of motion. Denies pain radiation, paresthesias, concern of bony injury, concern of foreign body or other complaints. States he thinks he had a tetanus and musician approximate 1 year ago, but unsure. Reports otherwise feels well. Reports right hand dominant. States minimal pain at this time to left index finger. States pain is more of a burning sensation to the skin itself.  Denies chest pain, shortness of breath, other extremity pain, extremity swelling or rash. Denies recent sickness. Denies recent antibiotic use.    Past Medical History:  Diagnosis Date  . AGE (acute gastroenteritis) 08/19/2014  . Anemia    hx of  . ARF (acute renal failure) ([place]) 08/19/2014  . 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 Chronic kidney disease    acute renal failue?  . Colon polyps   . Diverticulitis   . Gallstones   . Hypercholesterolemia   . Hypertension    controlled on meds  . Hypokalemia 01/02/2016  . Peripheral vascular disease (HCC)    varicose veins  . 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 Lumbar spondylosis 08/09/2016  . Chronic midline low back pain with right-sided sciatica (Location of Secondary source of pain) 08/09/2016  . 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 Chronic neck pain (Location of Tertiary source of pain) (Left) 08/09/2016  .  Cholelithiasis 07/29/2016  . Degenerative arthritis of spine 07/29/2016  . Hyperlipidemia, unspecified 07/29/2016  . Hypertension 07/29/2016  . Iron deficiency anemia 07/29/2016  . Right bundle branch block 07/29/2016  . Varicose veins of lower extremity 07/29/2016  . Chronic lower extremity pain (Location of Primary Source of Pain) (Right) 07/29/2016  . Chronic pain syndrome 07/29/2016  . Facet syndrome, lumbar ([place]) 09/02/2015  . Cervical facet syndrome ([place]) 09/02/2015  . DDD (degenerative disc disease), cervical 05/01/2015  . DDD (degenerative disc disease), lumbar 05/01/2015    Past Surgical History:  Procedure Laterality Date  . CARDIAC CATHETERIZATION  1999?   no issue/ DR Nehemiah Massed  . CATARACT EXTRACTION W/PHACO Left 02/04/2016   Procedure: CATARACT EXTRACTION PHACO AND INTRAOCULAR LENS PLACEMENT (IOC) LEFT EYE;  Surgeon: Leandrew Koyanagi, MD;  Location: [facility];  Service: Ophthalmology;  Laterality: Left;  . CATARACT EXTRACTION W/PHACO Right 03/03/2016   Procedure: CATARACT EXTRACTION PHACO AND INTRAOCULAR LENS PLACEMENT (IOC);  Surgeon: Leandrew Koyanagi, MD;  Location: [facility];  Service: Ophthalmology;  Laterality: Right;  . COLONOSCOPY    . EYE SURGERY  May and June   bilateral cataract  . ganglion cyst removal Right   . PILONIDAL CYST EXCISION       No current facility-administered medications for this encounter.   Current Outpatient Prescriptions:  .  aspirin 325 MG tablet, Take 325 mg by mouth daily. am, Disp: , Rfl:  .  cephALEXin (KEFLEX) 500 MG capsule, Take 1 capsule (500 mg total) by mouth  3 (three) times daily., Disp: 21 capsule, Rfl: 0 .  docusate sodium (COLACE) 100 MG capsule, Take 100 mg by mouth 2 (two) times daily. Am and dinner, Disp: , Rfl:  .  doxycycline (VIBRA-TABS) 100 MG tablet, Take 1 tablet (100 mg total) by mouth 2 (two) times daily., Disp: 20 tablet, Rfl: 0 .  gabapentin (NEURONTIN) 300 MG capsule, Limit 2-3 tablets  by mouth per day or twice per day if tolerated, Disp: 540 capsule, Rfl: 0 .  Hypertonic Nasal Wash (SINUS RINSE KIT NA), Place into the nose as needed., Disp: , Rfl:  .  latanoprost (XALATAN) 0.005 % ophthalmic solution, Place 1 drop into both eyes at bedtime., Disp: , Rfl:  .  lisinopril-hydrochlorothiazide (PRINZIDE,ZESTORETIC) 20-25 MG tablet, TAKE ONE TABLET BY MOUTH DAILY., Disp: , Rfl:  .  Multiple Vitamins-Minerals (ICAPS) CAPS, Take 1 capsule by mouth daily. , Disp: , Rfl:  .  Multiple Vitamins-Minerals (PRESERVISION AREDS PO), Take 1 tablet by mouth 2 (two) times daily. Am and dinner, Disp: , Rfl:  .  mupirocin ointment (BACTROBAN) 2 %, Apply two times a day for 7 days., Disp: 22 g, Rfl: 0 .  simvastatin (ZOCOR) 20 MG tablet, Take 20 mg by mouth daily. bedtime, Disp: , Rfl:   Allergies Codeine; Vicodin [hydrocodone-acetaminophen]; and Celebrex [celecoxib]  Family History  Problem Relation Age of Onset  . Heart disease Father   . Dementia Mother   . Heart disease Brother   . Heart disease Brother   . Diabetes Brother     Social History Social History  Substance Use Topics  . Smoking status: Never Smoker  . Smokeless tobacco: Never Used  . Alcohol use No    Review of Systems Constitutional: No fever/chills Cardiovascular: Denies chest pain. Respiratory: Denies shortness of breath. Gastrointestinal: No abdominal pain.   Musculoskeletal: Negative for back pain. Skin: As above.    ____________________________________________   PHYSICAL EXAM:  VITAL SIGNS: ED Triage Vitals  Enc Vitals Group     BP 05/16/17 1117 133/81     Pulse Rate 05/16/17 1117 71     Resp 05/16/17 1117 16     Temp 05/16/17 1117 97.9 F (36.6 C)     Temp Source 05/16/17 1117 Oral     SpO2 05/16/17 1117 96 %     Weight 05/16/17 1115 220 lb (99.8 kg)     Height 05/16/17 1115 '5\' 10"'  (1.778 m)     Head Circumference --      Peak Flow --      Pain Score 05/16/17 1115 1     Pain Loc --       Pain Edu? --      Excl. in [place]? --     Constitutional: Alert and oriented. Well appearing and in no acute distress. Cardiovascular: Normal rate, regular rhythm. Grossly normal heart sounds.  Good peripheral circulation. Respiratory: Normal respiratory effort without tachypnea nor retractions. Breath sounds are clear and equal bilaterally. No wheezes, rales, rhonchi. Musculoskeletal:  Steady gait. See skin below.  Neurologic:  Normal speech and language. No gross focal neurologic deficits are appreciated. Speech is normal. No gait instability.  Skin:  Skin is warm, dry. Except: dorsal second finger middle phalanx superficial skin avulsion approximately 1.5 cm x1 cm  with mild active bleeding, no subcutaneous tissue visualize, no tendon or bone visualize, no point bone tenderness, full range of motion present with good resisted distal flexion and extension, normal distal sensation, minimal tenderness along skin injury site,  no surrounding erythema. Left hand otherwise nontender. Psychiatric: Mood and affect are normal. Speech and behavior are normal. Patient exhibits appropriate insight and judgment   ___________________________________________   LABS (all labs ordered are listed, but only abnormal results are displayed)  Labs Reviewed - No data to display   Via Bright everywhere 12/27/16 last BUN 17, creatinine 1.  RADIOLOGY  No results found. ____________________________________________   PROCEDURES Procedures  Procedure(s) performed:  Procedure explained and verbal consent obtained. Consent: Verbal consent obtained. Written consent not obtained. Risks and benefits: risks, benefits and alternatives were discussed Patient identity confirmed: verbally with patient and hospital-assigned identification number  Consent given by: patient   Laceration Repair Location: left index finger Length:  1.5 cm x1 cm Foreign bodies: no foreign bodies noted  Tendon involvement: none Nerve  involvement: none Preparation: Patient was prepped and draped in the usual sterile fashion. Anesthesia with: none Irrigation solution: saline and betadine Irrigation method: jet lavage Amount of cleaning: copious Repaired : no repair indicated. X 1 silver nitrate used. Surgicel applied.  Patient tolerate well. Wound well approximated post repair.  Antibiotic ointment and dressing applied.  Wound Bright instructions provided.  Observe for any signs of infection or other problems.      INITIAL IMPRESSION / ASSESSMENT AND PLAN / ED COURSE  Pertinent labs & imaging results that were available during my Bright of the patient were reviewed by me and considered in my medical decision making (see chart for details).  Well-appearing patient. No acute distress. Presents for evaluation of left index finger post accidental injury at home. Tetanus immunization updated. No suture repair indicated. Discussed evaluation by xray, patient declined. Wound was copiously irrigated and cleaned, 1 silver nitrate cautery stick to utilize, and then Surgicel applied. Bleeding stopped. Dressing applied. Discussed wound Bright and supportive Bright. As injury occurred with dirty object, will prophylactically start patient on oral Keflex and topical Bactroban. Patient has follow-up PCP appointment this coming Friday, and encourage wound check. Discussed strict follow up and return parameters.  Discussed follow up with Primary Bright physician this week. Discussed follow up and return parameters including no resolution or any worsening concerns. Patient verbalized understanding and agreed to plan.   ____________________________________________   FINAL CLINICAL IMPRESSION(S) / ED DIAGNOSES  Final diagnoses:  Skin avulsion  Injury of left index finger, initial encounter     Discharge Medication List as of 05/16/2017 12:26 PM    START taking these medications   Details  cephALEXin (KEFLEX) 500 MG capsule Take 1 capsule  (500 mg total) by mouth 3 (three) times daily., Starting Mon 05/16/2017, Until Mon 05/23/2017, Print    mupirocin ointment (BACTROBAN) 2 % Apply two times a day for 7 days., Print        Note: This dictation was prepared with Dragon dictation along with smaller phrase technology. Any transcriptional errors that result from this process are unintentional.         Marylene Land, NP 05/16/17 1411

## 2017-07-19 ENCOUNTER — Ambulatory Visit: Payer: Medicare Other | Attending: Pain Medicine | Admitting: Pain Medicine

## 2017-07-19 ENCOUNTER — Encounter: Payer: Self-pay | Admitting: Pain Medicine

## 2017-07-19 VITALS — BP 150/70 | HR 53 | Temp 98.0°F | Resp 16 | Ht 70.0 in | Wt 220.0 lb

## 2017-07-19 DIAGNOSIS — Z888 Allergy status to other drugs, medicaments and biological substances status: Secondary | ICD-10-CM | POA: Insufficient documentation

## 2017-07-19 DIAGNOSIS — Z820 Family history of epilepsy and other diseases of the nervous system: Secondary | ICD-10-CM | POA: Diagnosis not present

## 2017-07-19 DIAGNOSIS — I4510 Unspecified right bundle-branch block: Secondary | ICD-10-CM | POA: Insufficient documentation

## 2017-07-19 DIAGNOSIS — G894 Chronic pain syndrome: Secondary | ICD-10-CM | POA: Diagnosis not present

## 2017-07-19 DIAGNOSIS — Z8249 Family history of ischemic heart disease and other diseases of the circulatory system: Secondary | ICD-10-CM | POA: Insufficient documentation

## 2017-07-19 DIAGNOSIS — Z9841 Cataract extraction status, right eye: Secondary | ICD-10-CM | POA: Insufficient documentation

## 2017-07-19 DIAGNOSIS — M79671 Pain in right foot: Secondary | ICD-10-CM | POA: Insufficient documentation

## 2017-07-19 DIAGNOSIS — Z885 Allergy status to narcotic agent status: Secondary | ICD-10-CM | POA: Diagnosis not present

## 2017-07-19 DIAGNOSIS — Z86010 Personal history of colonic polyps: Secondary | ICD-10-CM | POA: Diagnosis not present

## 2017-07-19 DIAGNOSIS — E7800 Pure hypercholesterolemia, unspecified: Secondary | ICD-10-CM | POA: Diagnosis not present

## 2017-07-19 DIAGNOSIS — I10 Essential (primary) hypertension: Secondary | ICD-10-CM | POA: Diagnosis not present

## 2017-07-19 DIAGNOSIS — M48061 Spinal stenosis, lumbar region without neurogenic claudication: Secondary | ICD-10-CM | POA: Diagnosis not present

## 2017-07-19 DIAGNOSIS — Z9889 Other specified postprocedural states: Secondary | ICD-10-CM | POA: Insufficient documentation

## 2017-07-19 DIAGNOSIS — E876 Hypokalemia: Secondary | ICD-10-CM | POA: Insufficient documentation

## 2017-07-19 DIAGNOSIS — Z7982 Long term (current) use of aspirin: Secondary | ICD-10-CM | POA: Diagnosis not present

## 2017-07-19 DIAGNOSIS — M9983 Other biomechanical lesions of lumbar region: Secondary | ICD-10-CM

## 2017-07-19 DIAGNOSIS — M79604 Pain in right leg: Secondary | ICD-10-CM | POA: Diagnosis not present

## 2017-07-19 DIAGNOSIS — G8929 Other chronic pain: Secondary | ICD-10-CM | POA: Diagnosis not present

## 2017-07-19 DIAGNOSIS — D509 Iron deficiency anemia, unspecified: Secondary | ICD-10-CM | POA: Insufficient documentation

## 2017-07-19 DIAGNOSIS — M5416 Radiculopathy, lumbar region: Secondary | ICD-10-CM

## 2017-07-19 DIAGNOSIS — M542 Cervicalgia: Secondary | ICD-10-CM | POA: Insufficient documentation

## 2017-07-19 DIAGNOSIS — M479 Spondylosis, unspecified: Secondary | ICD-10-CM | POA: Insufficient documentation

## 2017-07-19 DIAGNOSIS — Z833 Family history of diabetes mellitus: Secondary | ICD-10-CM | POA: Diagnosis not present

## 2017-07-19 DIAGNOSIS — N179 Acute kidney failure, unspecified: Secondary | ICD-10-CM | POA: Insufficient documentation

## 2017-07-19 DIAGNOSIS — M47816 Spondylosis without myelopathy or radiculopathy, lumbar region: Secondary | ICD-10-CM | POA: Insufficient documentation

## 2017-07-19 DIAGNOSIS — M5116 Intervertebral disc disorders with radiculopathy, lumbar region: Secondary | ICD-10-CM | POA: Diagnosis not present

## 2017-07-19 DIAGNOSIS — M5126 Other intervertebral disc displacement, lumbar region: Secondary | ICD-10-CM | POA: Insufficient documentation

## 2017-07-19 DIAGNOSIS — I739 Peripheral vascular disease, unspecified: Secondary | ICD-10-CM | POA: Insufficient documentation

## 2017-07-19 DIAGNOSIS — Z79899 Other long term (current) drug therapy: Secondary | ICD-10-CM | POA: Diagnosis not present

## 2017-07-19 NOTE — Patient Instructions (Addendum)
____________________________________________________________________________________________  Preparing for Procedure with Sedation Instructions: . Oral Intake: Do not eat or drink anything for at least 8 hours prior to your procedure. . Transportation: Public transportation is not allowed. Bring an adult driver. The driver must be physically present in our waiting room before any procedure can be started. Marland Kitchen Physical Assistance: Bring an adult physically capable of assisting you, in the event you need help. This adult should keep you company at home for at least 6 hours after the procedure. . Blood Pressure Medicine: Take your blood pressure medicine with a sip of water the morning of the procedure. . Blood thinners:  . Diabetics on insulin: Notify the staff so that you can be scheduled 1st case in the morning. If your diabetes requires high dose insulin, take only  of your normal insulin dose the morning of the procedure and notify the staff that you have done so. . Preventing infections: Shower with an antibacterial soap the morning of your procedure. . Build-up your immune system: Take 1000 mg of Vitamin C with every meal (3 times a day) the day prior to your procedure. Marland Kitchen Antibiotics: Inform the staff if you have a condition or reason that requires you to take antibiotics before dental procedures. . Pregnancy: If you are pregnant, call and cancel the procedure. . Sickness: If you have a cold, fever, or any active infections, call and cancel the procedure. . Arrival: You must be in the facility at least 30 minutes prior to your scheduled procedure. . Children: Do not bring children with you. . Dress appropriately: Bring dark clothing that you would not mind if they get stained. . Valuables: Do not bring any jewelry or valuables. Procedure appointments are reserved for interventional treatments only. Marland Kitchen No Prescription Refills. . No medication changes will be discussed during procedure  appointments. . No disability issues will be discussed. ____________________________________________________________________________________________  Pain Management Discharge Instructions  General Discharge Instructions :  If you need to reach your doctor call: Monday-Friday 8:00 am - 4:00 pm at [phone] or toll free [phone].  After clinic hours [phone] to have operator reach doctor.  Bring all of your medication bottles to all your appointments in the pain clinic.  To cancel or reschedule your appointment with Pain Management please remember to call 24 hours in advance to avoid a fee.  Refer to the educational materials which you have been given on: General Risks, I had my Procedure. Discharge Instructions, Post Sedation.  Post Procedure Instructions:  The drugs you were given will stay in your system until tomorrow, so for the next 24 hours you should not drive, make any legal decisions or drink any alcoholic beverages.  You may eat anything you prefer, but it is better to start with liquids then soups and crackers, and gradually work up to solid foods.  Please notify your doctor immediately if you have any unusual bleeding, trouble breathing or pain that is not related to your normal pain.  Depending on the type of procedure that was done, some parts of your body may feel week and/or numb.  This usually clears up by tonight or the next day.  Walk with the use of an assistive device or accompanied by an adult for the 24 hours.  You may use ice on the affected area for the first 24 hours.  Put ice in a Ziploc bag and cover with a towel and place against area 15 minutes on 15 minutes off.  You may switch to heat after  24 hours. Epidural Steroid Injection An epidural steroid injection is a shot of steroid medicine and numbing medicine that is given into the space between the spinal cord and the bones in your back (epidural space). The shot helps relieve pain caused by an  irritated or swollen nerve root. The amount of pain relief you get from the injection depends on what is causing the nerve to be swollen and irritated, and how long your pain lasts. You are more likely to benefit from this injection if your pain is strong and comes on suddenly rather than if you have had pain for a long time. Tell a health care provider about:  Any allergies you have.  All medicines you are taking, including vitamins, herbs, eye drops, creams, and over-the-counter medicines.  Any problems you or family members have had with anesthetic medicines.  Any blood disorders you have.  Any surgeries you have had.  Any medical conditions you have.  Whether you are pregnant or may be pregnant. What are the risks? Generally, this is a safe procedure. However, problems may occur, including:  Headache.  Bleeding.  Infection.  Allergic reaction to medicines.  Damage to your nerves.  What happens before the procedure? Staying hydrated Follow instructions from your health care provider about hydration, which may include:  Up to 2 hours before the procedure - you may continue to drink clear liquids, such as water, clear fruit juice, black coffee, and plain tea.  Eating and drinking restrictions Follow instructions from your health care provider about eating and drinking, which may include:  8 hours before the procedure - stop eating heavy meals or foods such as meat, fried foods, or fatty foods.  6 hours before the procedure - stop eating light meals or foods, such as toast or cereal.  6 hours before the procedure - stop drinking milk or drinks that contain milk.  2 hours before the procedure - stop drinking clear liquids.  Medicine  You may be given medicines to lower anxiety.  Ask your health care provider about: ? Changing or stopping your regular medicines. This is especially important if you are taking diabetes medicines or blood thinners. ? Taking medicines  such as aspirin and ibuprofen. These medicines can thin your blood. Do not take these medicines before your procedure if your health care provider instructs you not to. General instructions  Plan to have someone take you home from the hospital or clinic. What happens during the procedure?  You may receive a medicine to help you relax (sedative).  You will be asked to lie on your abdomen.  The injection site will be cleaned.  A numbing medicine (local anesthetic) will be used to numb the injection site.  A needle will be inserted through your skin into the epidural space. You may feel some discomfort when this happens. An X-ray machine will be used to make sure the needle is put as close as possible to the affected nerve.  A steroid medicine and a local anesthetic will be injected into the epidural space.  The needle will be removed.  A bandage (dressing) will be put over the injection site. What happens after the procedure?  Your blood pressure, heart rate, breathing rate, and blood oxygen level will be monitored until the medicines you were given have worn off.  Your arm or leg may feel weak or numb for a few hours.  The injection site may feel sore.  Do not drive for 24 hours if you received  a sedative. This information is not intended to replace advice given to you by your health care provider. Make sure you discuss any questions you have with your health care provider. Document Released: 12/21/2007 Document Revised: 02/25/2016 Document Reviewed: 12/30/2015 [employer] Interactive Patient Education  2017 [employer] GENERAL RISKS AND COMPLICATIONS  What are the risk, side effects and possible complications? Generally speaking, most procedures are safe.  However, with any procedure there are risks, side effects, and the possibility of complications.  The risks and complications are dependent upon the sites that are lesioned, or the type of nerve block to be performed.  The  closer the procedure is to the spine, the more serious the risks are.  Great care is taken when placing the radio frequency needles, block needles or lesioning probes, but sometimes complications can occur. 1. Infection: Any time there is an injection through the skin, there is a risk of infection.  This is why sterile conditions are used for these blocks.  There are four possible types of infection. 1. Localized skin infection. 2. Central Nervous System Infection-This can be in the form of Meningitis, which can be deadly. 3. Epidural Infections-This can be in the form of an epidural abscess, which can cause pressure inside of the spine, causing compression of the spinal cord with subsequent paralysis. This would require an emergency surgery to decompress, and there are no guarantees that the patient would recover from the paralysis. 4. Discitis-This is an infection of the intervertebral discs.  It occurs in about 1% of discography procedures.  It is difficult to treat and it may lead to surgery.        2. Pain: the needles have to go through skin and soft tissues, will cause soreness.       3. Damage to internal structures:  The nerves to be lesioned may be near blood vessels or    other nerves which can be potentially damaged.       4. Bleeding: Bleeding is more common if the patient is taking blood thinners such as  aspirin, Coumadin, Ticiid, Plavix, etc., or if he/she have some genetic predisposition  such as hemophilia. Bleeding into the spinal canal can cause compression of the spinal  cord with subsequent paralysis.  This would require an emergency surgery to  decompress and there are no guarantees that the patient would recover from the  paralysis.       5. Pneumothorax:  Puncturing of a lung is a possibility, every time a needle is introduced in  the area of the chest or upper back.  Pneumothorax refers to free air around the  collapsed lung(s), inside of the thoracic cavity (chest cavity).   Another two possible  complications related to a similar event would include: Hemothorax and Chylothorax.   These are variations of the Pneumothorax, where instead of air around the collapsed  lung(s), you may have blood or chyle, respectively.       6. Spinal headaches: They may occur with any procedures in the area of the spine.       7. Persistent CSF (Cerebro-Spinal Fluid) leakage: This is a rare problem, but may occur  with prolonged intrathecal or epidural catheters either due to the formation of a fistulous  track or a dural tear.       8. Nerve damage: By working so close to the spinal cord, there is always a possibility of  nerve damage, which could be as serious as a permanent spinal cord  injury with  paralysis.       9. Death:  Although rare, severe deadly allergic reactions known as "Anaphylactic  reaction" can occur to any of the medications used.      10. Worsening of the symptoms:  We can always make thing worse.  What are the chances of something like this happening? Chances of any of this occuring are extremely low.  By statistics, you have more of a chance of getting killed in a motor vehicle accident: while driving to the hospital than any of the above occurring .  Nevertheless, you should be aware that they are possibilities.  In general, it is similar to taking a shower.  Everybody knows that you can slip, hit your head and get killed.  Does that mean that you should not shower again?  Nevertheless always keep in mind that statistics do not mean anything if you happen to be on the wrong side of them.  Even if a procedure has a 1 (one) in a 1,000,000 (million) chance of going wrong, it you happen to be that one..Also, keep in mind that by statistics, you have more of a chance of having something go wrong when taking medications.  Who should not have this procedure? If you are on a blood thinning medication (e.g. Coumadin, Plavix, see list of "Blood Thinners"), or if you have an active  infection going on, you should not have the procedure.  If you are taking any blood thinners, please inform your physician.  How should I prepare for this procedure?  Do not eat or drink anything at least six hours prior to the procedure.  Bring a driver with you .  It cannot be a taxi.  Come accompanied by an adult that can drive you back, and that is strong enough to help you if your legs get weak or numb from the local anesthetic.  Take all of your medicines the morning of the procedure with just enough water to swallow them.  If you have diabetes, make sure that you are scheduled to have your procedure done first thing in the morning, whenever possible.  If you have diabetes, take only half of your insulin dose and notify our nurse that you have done so as soon as you arrive at the clinic.  If you are diabetic, but only take blood sugar pills (oral hypoglycemic), then do not take them on the morning of your procedure.  You may take them after you have had the procedure.  Do not take aspirin or any aspirin-containing medications, at least eleven (11) days prior to the procedure.  They may prolong bleeding.  Wear loose fitting clothing that may be easy to take off and that you would not mind if it got stained with Betadine or blood.  Do not wear any jewelry or perfume  Remove any nail coloring.  It will interfere with some of our monitoring equipment.  NOTE: Remember that this is not meant to be interpreted as a complete list of all possible complications.  Unforeseen problems may occur.  BLOOD THINNERS The following drugs contain aspirin or other products, which can cause increased bleeding during surgery and should not be taken for 2 weeks prior to and 1 week after surgery.  If you should need take something for relief of minor pain, you may take acetaminophen which is found in Tylenol,m Datril, Anacin-3 and Panadol. It is not blood thinner. The products listed below are.  Do not  take any of  the products listed below in addition to any listed on your instruction sheet.  A.P.C or A.P.C with Codeine Codeine Phosphate Capsules #3 Ibuprofen Ridaura  ABC compound Congesprin Imuran rimadil  Advil Cope Indocin Robaxisal  Alka-Seltzer Effervescent Pain Reliever and Antacid Coricidin or Coricidin-D  Indomethacin Rufen  Alka-Seltzer plus Cold Medicine Cosprin Ketoprofen S-A-C Tablets  Anacin Analgesic Tablets or Capsules Coumadin Korlgesic Salflex  Anacin Extra Strength Analgesic tablets or capsules CP-2 Tablets Lanoril Salicylate  Anaprox Cuprimine Capsules Levenox Salocol  Anexsia-D Dalteparin Magan Salsalate  Anodynos Darvon compound Magnesium Salicylate Sine-off  Ansaid Dasin Capsules Magsal Sodium Salicylate  Anturane Depen Capsules Marnal Soma  APF Arthritis pain formula Dewitt's Pills Measurin Stanback  Argesic Dia-Gesic Meclofenamic Sulfinpyrazone  Arthritis Bayer Timed Release Aspirin Diclofenac Meclomen Sulindac  Arthritis pain formula Anacin Dicumarol Medipren Supac  Analgesic (Safety coated) Arthralgen Diffunasal Mefanamic Suprofen  Arthritis Strength Bufferin Dihydrocodeine Mepro Compound Suprol  Arthropan liquid Dopirydamole Methcarbomol with Aspirin Synalgos  ASA tablets/Enseals Disalcid Micrainin Tagament  Ascriptin Doan's Midol Talwin  Ascriptin A/D Dolene Mobidin Tanderil  Ascriptin Extra Strength Dolobid Moblgesic Ticlid  Ascriptin with Codeine Doloprin or Doloprin with Codeine Momentum Tolectin  Asperbuf Duoprin Mono-gesic Trendar  Aspergum Duradyne Motrin or Motrin IB Triminicin  Aspirin plain, buffered or enteric coated Durasal Myochrisine Trigesic  Aspirin Suppositories Easprin Nalfon Trillsate  Aspirin with Codeine Ecotrin Regular or Extra Strength Naprosyn Uracel  Atromid-S Efficin Naproxen Ursinus  Auranofin Capsules Elmiron Neocylate Vanquish  Axotal Emagrin Norgesic Verin  Azathioprine Empirin or Empirin with Codeine Normiflo Vitamin E   Azolid Emprazil Nuprin Voltaren  Bayer Aspirin plain, buffered or children's or timed BC Tablets or powders Encaprin Orgaran Warfarin Sodium  Buff-a-Comp Enoxaparin Orudis Zorpin  Buff-a-Comp with Codeine Equegesic Os-Cal-Gesic   Buffaprin Excedrin plain, buffered or Extra Strength Oxalid   Bufferin Arthritis Strength Feldene Oxphenbutazone   Bufferin plain or Extra Strength Feldene Capsules Oxycodone with Aspirin   Bufferin with Codeine Fenoprofen Fenoprofen Pabalate or Pabalate-SF   Buffets II Flogesic Panagesic   Buffinol plain or Extra Strength Florinal or Florinal with Codeine Panwarfarin   Buf-Tabs Flurbiprofen Penicillamine   Butalbital Compound Four-way cold tablets Penicillin   Butazolidin Fragmin Pepto-Bismol   Carbenicillin Geminisyn Percodan   Carna Arthritis Reliever Geopen Persantine   Carprofen Gold's salt Persistin   Chloramphenicol Goody's Phenylbutazone   Chloromycetin Haltrain Piroxlcam   Clmetidine heparin Plaquenil   Cllnoril Hyco-pap Ponstel   Clofibrate Hydroxy chloroquine Propoxyphen         Before stopping any of these medications, be sure to consult the physician who ordered them.  Some, such as Coumadin (Warfarin) are ordered to prevent or treat serious conditions such as "deep thrombosis", "pumonary embolisms", and other heart problems.  The amount of time that you may need off of the medication may also vary with the medication and the reason for which you were taking it.  If you are taking any of these medications, please make sure you notify your pain physician before you undergo any procedures.

## 2017-07-19 NOTE — Progress Notes (Signed)
Safety precautions to be maintained throughout the outpatient stay will include: orient to surroundings, keep bed in low position, maintain call bell within reach at all times, provide assistance with transfer out of bed and ambulation.  

## 2017-07-19 NOTE — Progress Notes (Signed)
Patient's Name: Brandon Bright  MRN: [id number]  Referring Provider: Leonel Ramsay, MD  DOB: 01-05-1946  PCP: Leonel Ramsay, MD  DOS: 07/19/2017  Note by: Gaspar Cola, MD  Service setting: Ambulatory outpatient  Specialty: Interventional Pain Management  Location: [facility] (AMB) Pain Management Facility    Patient type: Established   Primary Reason(s) for Visit: Evaluation of chronic illnesses with exacerbation, or progression (Level of risk: moderate) CC: Back Pain (lower) and Leg Pain (both)  HPI  Brandon Bright is a 71 y.o. year old, male patient, who comes today for a follow-up evaluation. He has DDD (degenerative disc disease), cervical; DDD (degenerative disc disease), lumbar; Lumbar facet syndrome; Cervical facet syndrome; Cholelithiasis; Degenerative arthritis of spine; Hyperlipidemia, unspecified; Hypertension; Iron deficiency anemia; Right bundle branch block; Varicose veins of lower extremity; Chronic lower extremity pain (Primary Source of Pain) (Right); Chronic pain syndrome; Lumbar spondylosis; Chronic midline low back pain with right-sided sciatica (Secondary source of pain); Lumbar discogenic pain syndrome; Chronic neck pain (Tertiary source of pain) (Left); Lumbar radiculitis (Right) (L3 & L4); 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 Brandon Bright was last seen on Visit date not found. His primarily concern today is the Back Pain (lower) and Leg Pain (both)  Pain Assessment: Location: Lower Back Radiating: down  both legs, mainly the left leg Onset: More than a month ago Duration: Acute pain Quality: Aching, Constant, Radiating (constant pain when standing up) Severity: 0-No pain/10 (self-reported pain score)  Note: Reported level is compatible with observation.                         Effect on ADL: pace self Timing: Other (Comment)  (hasn't had pain since procedure in 2006-2007) Modifying factors: has not had pain   Further details on both, my assessment(s), as well as the proposed treatment plan, please see below. The patient returns to the clinics today doing rather well for a quick check up. He is currently having no pain and the gabapentin seems to be controlling his symptoms. For the time being, I have recommended that he continue on that since he describes no side effects. From now on, he will be coming back to see our nurse practitioner on a yearly basis, unless he needs a when necessary procedure at which time he will be scheduled with me for such treatment.  Laboratory Chemistry  Inflammation Markers (CRP: Acute Phase) (ESR: Chronic Phase) No results found for: CRP, ESRSEDRATE               Renal Function Markers Lab Results  Component Value Date   BUN 16 08/17/2014   CREATININE 1.16 08/17/2014   GFRAA >60 08/17/2014   GFRNONAA >60 08/17/2014                 Hepatic Function Markers Lab Results  Component Value Date   AST 15 08/17/2014   ALT 20 08/17/2014   ALBUMIN 2.7 (L) 08/17/2014   ALKPHOS 43 (L) 08/17/2014                 Electrolytes Lab Results  Component Value Date   NA 144 08/17/2014   K 3.3 (L) 08/17/2014   CL 111 (H) 08/17/2014   CALCIUM 7.5 (L) 08/17/2014   MG 1.7 (L) 08/16/2014  Neuropathy Markers No results found for: [id number]               Bone Pathology Markers Lab Results  Component Value Date   ALKPHOS 43 (L) 08/17/2014   CALCIUM 7.5 (L) 08/17/2014                 Coagulation Parameters Lab Results  Component Value Date   PLT 163 08/17/2014                 Cardiovascular Markers Lab Results  Component Value Date   HGB 13.0 08/17/2014   HCT 38.9 (L) 08/17/2014                 Note: Lab results reviewed.  Recent Diagnostic Imaging Review  Lumbosacral Imaging: Lumbar MR wo contrast:  Results for orders placed in visit on 04/14/09  MR L  Spine Ltd W/O Cm   Narrative * PRIOR REPORT IMPORTED FROM AN EXTERNAL SYSTEM *   PRIOR REPORT IMPORTED FROM THE [facility] [place] EXAM:    low back pain w right heel pain  COMMENTS:   PROCEDURE:     MR  - MR LUMBAR SPINE WO CONTRAST  - Apr 14 2009  2:08PM   RESULT:     MRI LUMBAR SPINE WITHOUT CONTRAST   HISTORY: Low back pain. Right-sided pain.   COMPARISON: 03/08/2006   TECHNIQUE: Multiplanar and multisequence MRI of the lumbar spine were  obtained, without administration of IV contrast.   FINDINGS:   The vertebral bodies of the lumbar spine are normal in size and alignment.  There is normal bone marrow signal demonstrated throughout the vertebra.  There is disc desiccation with disc height loss throughout the lumbar  spine.   The spinal cord is of normal volume and contour. The cord terminates  normally at L1 . The nerve roots of the cauda equina and the filum  terminale  have the usual appearance.   The visualized portions of the SI joints are unremarkable.   The imaged intra-abdominal contents are unremarkable.   T12-L1: No significant disc bulge. No evidence of neural foraminal or  central stenosis.   L1-L2: Mild broad-based disc bulge with a left paracentral predominance.  No evidence of neural foraminal or central stenosis.   L2-L3: There is a mild broad-based disc bulge with a superimposed mild  left paracentral disc protrusion impressing on the thecal sac and with mild  mass effect upon the left intrathecal nerve roots. There is moderate spinal  stenosis. There is no significant foraminal stenosis.   L3-L4: A moderate broad-based disc bulge with flattening of the ventral  thecal sac. Moderate bilateral facet arthropathy. Moderate central canal  stenosis. There is severe right foraminal narrowing. There is no left  foraminal narrowing.   L4-L5: Moderate broad-based disc bulge with a right paracentral disc  component abutting the  extraforaminal right L4 nerve root. There is severe right foraminal narrowing. There is no significant left foraminal  narrowing. There is mild bilateral facet arthropathy with ligamentum flavum infolding resulting in moderate spinal stenosis.   L5-S1: There is a moderate sized central disc protrusion with impressing  on the ventral thecal sac. No evidence of neural foraminal or central stenosis.   IMPRESSION:   1. Lumbar spine spondylosis as described above without significant  interval change compared with the prior exam.       Complexity Note: Imaging results reviewed. Results shared with Brandon Bright, using Layman's terms.  Meds   Current Outpatient Prescriptions:  .  aspirin 325 MG tablet, Take 325 mg by mouth daily. am, Disp: , Rfl:  .  docusate sodium (COLACE) 100 MG capsule, Take 100 mg by mouth 2 (two) times daily. Am and dinner, Disp: , Rfl:  .  gabapentin (NEURONTIN) 300 MG capsule, Limit 2-3 tablets by mouth per day or twice per day if tolerated, Disp: 540 capsule, Rfl: 0 .  Hypertonic Nasal Wash (SINUS RINSE KIT NA), Place into the nose as needed., Disp: , Rfl:  .  latanoprost (XALATAN) 0.005 % ophthalmic solution, Place 1 drop into both eyes at bedtime., Disp: , Rfl:  .  lisinopril-hydrochlorothiazide (PRINZIDE,ZESTORETIC) 20-25 MG tablet, TAKE ONE TABLET BY MOUTH DAILY., Disp: , Rfl:  .  metoprolol succinate (TOPROL-XL) 50 MG 24 hr tablet, Take 50 mg by mouth daily. , Disp: , Rfl:  .  Multiple Vitamins-Minerals (ICAPS) CAPS, Take 1 capsule by mouth daily. , Disp: , Rfl:  .  Multiple Vitamins-Minerals (PRESERVISION AREDS PO), Take 1 tablet by mouth 2 (two) times daily. Am and dinner, Disp: , Rfl:  .  mupirocin ointment (BACTROBAN) 2 %, Apply two times a day for 7 days., Disp: 22 g, Rfl: 0 .  simvastatin (ZOCOR) 20 MG tablet, Take 20 mg by mouth daily. bedtime, Disp: , Rfl:   ROS  Constitutional: Denies any fever or chills Gastrointestinal: No  reported hemesis, hematochezia, vomiting, or acute GI distress Musculoskeletal: Denies any acute onset joint swelling, redness, loss of ROM, or weakness Neurological: No reported episodes of acute onset apraxia, aphasia, dysarthria, agnosia, amnesia, paralysis, loss of coordination, or loss of consciousness  Allergies  Brandon Bright is allergic to codeine; vicodin [hydrocodone-acetaminophen]; and celebrex [celecoxib].  PFSH  Drug: Brandon Bright  reports that he does not use drugs. Alcohol:  reports that he does not drink alcohol. Tobacco:  reports that he has never smoked. He has never used smokeless tobacco. 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 Surgical: Brandon Bright  has a past surgical history that includes ganglion cyst removal (Right); Cataract extraction w/PHACO (Left, 02/04/2016); Colonoscopy; Pilonidal cyst excision; Cardiac catheterization (1999?); Cataract extraction w/PHACO (Right, 03/03/2016); and Eye surgery (May and June). Family: family history includes Dementia in his mother; Diabetes in his brother; Heart disease in his brother, brother, and father.  Constitutional Exam  General appearance: Well nourished, well developed, and well hydrated. In no apparent acute distress Vitals:   07/19/17 0758  BP: (!) 150/70  Pulse: (!) 53  Resp: 16  Temp: 98 F (36.7 C)  SpO2: 96%  Weight: 220 lb (99.8 kg)  Height: _0  (1.778 m)   BMI Assessment: Estimated body mass index is 31.57 kg/m as calculated from the following:   Height as of this encounter: _1  (1.778 m).   Weight as of this encounter: 220 lb (99.8 kg).  BMI interpretation table: BMI level Category Range  association with higher incidence of chronic pain  <18 kg/m2 Underweight   18.5-24.9 kg/m2 Ideal body weight   25-29.9 kg/m2 Overweight Increased incidence by 20%  30-34.9 kg/m2 Obese (Class I) Increased incidence by 68%  35-39.9 kg/m2 Severe obesity (Class II) Increased incidence by 136%  >40 kg/m2 Extreme obesity (Class III) Increased incidence by 254%   BMI Readings from Last 4 Encounters:  07/19/17 31.57 kg/m  05/16/17 31.57 kg/m  12/28/16 31.57 kg/m  07/29/16 32.28 kg/m   Wt Readings from Last 4 Encounters:  07/19/17 220 lb (99.8 kg)  05/16/17 220 lb (99.8 kg)  12/28/16 220 lb (99.8 kg)  07/29/16 225 lb (102.1 kg)  Psych/Mental status: Alert, oriented x 3 (person, place, & time)       Eyes: PERLA Respiratory: No evidence of acute respiratory distress  Cervical Spine Area Exam  Skin & Axial Inspection: No masses, redness, edema, swelling, or associated skin lesions Alignment: Symmetrical Functional ROM: Unrestricted ROM      Stability: No instability detected Muscle Tone/Strength: Functionally intact. No obvious neuro-muscular anomalies detected. Sensory (Neurological): Unimpaired Palpation: No palpable anomalies              Upper Extremity (UE) Exam    Side: Right upper extremity  Side: Left upper extremity  Skin & Extremity Inspection: Skin color, temperature, and hair growth are WNL. No peripheral edema or cyanosis. No masses, redness, swelling, asymmetry, or associated skin lesions. No contractures.  Skin & Extremity Inspection: Skin color, temperature, and hair growth are WNL. No peripheral edema or cyanosis. No masses, redness, swelling, asymmetry, or associated skin lesions. No contractures.  Functional ROM: Unrestricted ROM          Functional ROM: Unrestricted ROM          Muscle Tone/Strength: Functionally intact. No obvious neuro-muscular anomalies detected.  Muscle Tone/Strength: Functionally intact. No obvious neuro-muscular anomalies detected.  Sensory  (Neurological): Unimpaired          Sensory (Neurological): Unimpaired          Palpation: No palpable anomalies              Palpation: No palpable anomalies              Specialized Test(s): Deferred         Specialized Test(s): Deferred          Thoracic Spine Area Exam  Skin & Axial Inspection: No masses, redness, or swelling Alignment: Symmetrical Functional ROM: Unrestricted ROM Stability: No instability detected Muscle Tone/Strength: Functionally intact. No obvious neuro-muscular anomalies detected. Sensory (Neurological): Unimpaired Muscle strength & Tone: No palpable anomalies  Lumbar Spine Area Exam  Skin & Axial Inspection: No masses, redness, or swelling Alignment: Symmetrical Functional ROM: Unrestricted ROM      Stability: No instability detected Muscle Tone/Strength: Functionally intact. No obvious neuro-muscular anomalies detected. Sensory (Neurological): Unimpaired Palpation: No palpable anomalies       Provocative Tests: Lumbar Hyperextension and rotation test: evaluation deferred today       Lumbar Lateral bending test: evaluation deferred today       Patrick's Maneuver: evaluation deferred today                    Gait & Posture Assessment  Ambulation: Unassisted Gait: Relatively normal for age and body habitus Posture: WNL   Lower Extremity Exam    Side: Right lower extremity  Side: Left lower extremity  Skin & Extremity Inspection: Skin color, temperature, and hair growth are WNL. No peripheral edema or cyanosis. No masses, redness, swelling, asymmetry, or associated skin lesions. No contractures.  Skin & Extremity Inspection: Skin color, temperature, and hair growth are WNL. No peripheral edema or cyanosis. No masses, redness, swelling, asymmetry, or associated skin lesions. No contractures.  Functional ROM: Unrestricted ROM          Functional ROM: Unrestricted ROM  Muscle Tone/Strength: Functionally intact. No obvious neuro-muscular anomalies  detected.  Muscle Tone/Strength: Functionally intact. No obvious neuro-muscular anomalies detected.  Sensory (Neurological): Unimpaired  Sensory (Neurological): Unimpaired  Palpation: No palpable anomalies  Palpation: No palpable anomalies   Assessment  Primary Diagnosis & Pertinent Problem List: The primary encounter diagnosis was Lumbar radiculitis (Right) (L3 & L4). 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  Status Diagnosis  Controlled Controlled Controlled 1. Lumbar radiculitis (Right) (L3 & L4)   2. 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 Chronic lower extremity pain (Primary Source of Pain) (Right)   4. 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  New Prescriptions   No medications on file   Medications administered today: Brandon Bright had no medications administered during this visit.   Procedure Orders     Lumbar Epidural Injection     Lumbar Transforaminal Epidural Lab Orders  No laboratory test(s) ordered today   Imaging Orders  No imaging studies ordered today   Referral Orders  No referral(s) requested today    Interventional management options: Planned, scheduled, and/or  pending:   None at this time    Considering:   Palliative right-sided L4-5 interlaminar lumbar epidural steroid injection  Diagnostic right-sided L3 transforaminal epidural steroid injection  Diagnostic right-sided L4 transforaminal epidural steroid injection  Diagnostic bilateral lumbar facet block    Palliative PRN treatment(s):   Palliative right-sided L4-5 interlaminar lumbar epidural steroid injection  Diagnostic right-sided L3 transforaminal epidural steroid injection  Diagnostic right-sided L4 transforaminal epidural steroid injection    Provider-requested follow-up: Return in about 1 year (around 07/19/2018) for Med-Mgmt (w/ Dionisio David, NP).  Future Appointments Date Time Provider [place]  07/12/2018 9:00 AM Vevelyn Francois, NP [facility] None   Primary Care Physician: Leonel Ramsay, MD Location: [facility] Note by: Gaspar Cola, MD Date: 07/19/2017; Time: 8:31 AM

## 2017-08-28 ENCOUNTER — Emergency Department
Admission: EM | Admit: 2017-08-28 | Discharge: 2017-08-28 | Disposition: A | Discharge status: Home / Self Care | Payer: Medicare Other | Attending: Emergency Medicine | Admitting: Emergency Medicine

## 2017-08-28 ENCOUNTER — Encounter: Payer: Self-pay | Admitting: Emergency Medicine

## 2017-08-28 ENCOUNTER — Emergency Department: Payer: Medicare Other

## 2017-08-28 ENCOUNTER — Other Ambulatory Visit: Payer: Self-pay

## 2017-08-28 DIAGNOSIS — I129 Hypertensive chronic kidney disease with stage 1 through stage 4 chronic kidney disease, or unspecified chronic kidney disease: Secondary | ICD-10-CM | POA: Insufficient documentation

## 2017-08-28 DIAGNOSIS — M436 Torticollis: Secondary | ICD-10-CM

## 2017-08-28 DIAGNOSIS — N189 Chronic kidney disease, unspecified: Secondary | ICD-10-CM | POA: Diagnosis not present

## 2017-08-28 DIAGNOSIS — Z7982 Long term (current) use of aspirin: Secondary | ICD-10-CM | POA: Insufficient documentation

## 2017-08-28 DIAGNOSIS — Z79899 Other long term (current) drug therapy: Secondary | ICD-10-CM | POA: Diagnosis not present

## 2017-08-28 DIAGNOSIS — M542 Cervicalgia: Secondary | ICD-10-CM | POA: Diagnosis present

## 2017-08-28 MED ORDER — ORPHENADRINE CITRATE 30 MG/ML IJ SOLN
60.0000 mg | Freq: Once | INTRAMUSCULAR | Status: AC
  Administered 2017-08-28: 60 mg via INTRAMUSCULAR
  Filled 2017-08-28: qty 2

## 2017-08-28 MED ORDER — KETOROLAC TROMETHAMINE 30 MG/ML IJ SOLN
30.0000 mg | Freq: Once | INTRAMUSCULAR | Status: AC
  Administered 2017-08-28: 30 mg via INTRAMUSCULAR
  Filled 2017-08-28: qty 1

## 2017-08-28 MED ORDER — DIAZEPAM 2 MG PO TABS: 2.0000 mg | ORAL_TABLET | Freq: Three times a day (TID) | ORAL | 0 refills | Status: DC | PRN

## 2017-08-28 MED ORDER — DIAZEPAM 2 MG PO TABS
2.0000 mg | ORAL_TABLET | Freq: Once | ORAL | Status: AC
  Administered 2017-08-28: 2 mg via ORAL
  Filled 2017-08-28: qty 1

## 2017-08-28 MED ORDER — KETOROLAC TROMETHAMINE 10 MG PO TABS: 10.0000 mg | ORAL_TABLET | Freq: Four times a day (QID) | ORAL | 0 refills | Status: DC | PRN

## 2017-08-28 NOTE — ED Notes (Signed)
Pt has hx of having neck pain in which he had to get a block from pain mgmt. Spoke with dr. Alphonzo LemmingsMcshane, no bloodwork or xr needed at this time.

## 2017-08-28 NOTE — Discharge Instructions (Signed)
Follow up with your primary care provider for symptoms that are not improving over the next few days.  Return to the ER for symptoms that change or worsen if unable to schedule an appointment. 

## 2017-08-28 NOTE — ED Provider Notes (Signed)
[facility] Emergency Department Provider Note ____________________________________________  Time seen: Approximately 11:56 AM  I have reviewed the triage vital signs and the nursing notes.   HISTORY  Chief Complaint Torticollis    HPI Brandon Bright is a 71 y.o. male who presents to the emergency department for evaluation of neck pain after working overhead 5 days ago. He has a history of the same, but has not had any pain in several years. He worked with his son-in-law last week prior to onset of pain, but did not fall or have any specific injury/point of onset. He has taken tylenol without relief.  Past Medical History:  Diagnosis Date  . AGE (acute gastroenteritis) 08/19/2014  . Anemia    hx of  . ARF (acute renal failure) ([place]) 08/19/2014  . 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 Chronic kidney disease    acute renal failue?  . Colon polyps   . Diverticulitis   . Gallstones   . Hypercholesterolemia   . Hypertension    controlled on meds  . Hypokalemia 01/02/2016  . Peripheral vascular disease (HCC)    varicose veins  . 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 Lumbar radiculitis (Right) (L3 & L4) 07/19/2017  . Lumbar foraminal stenosis (Right: Severe L3-4 & L4-5) 07/19/2017  . Lumbar disc protrusion (Left: L2-3) (Central: L5-S1) 07/19/2017  . Lumbar facet arthropathy (Bilateral) 07/19/2017  . Lumbar spondylosis 08/09/2016  . Chronic midline low back pain with right-sided sciatica (Secondary source of pain) 08/09/2016  . 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 Chronic neck pain [facility] source of pain) (Left) 08/09/2016  . Cholelithiasis 07/29/2016  . Degenerative arthritis of spine 07/29/2016  . Hyperlipidemia, unspecified 07/29/2016  . Hypertension 07/29/2016  . Iron deficiency anemia 07/29/2016  . Right bundle branch block 07/29/2016  . Varicose veins of lower extremity  07/29/2016  . Chronic lower extremity pain (Primary Source of Pain) (Right) 07/29/2016  . Chronic pain syndrome 07/29/2016  . Lumbar facet syndrome 09/02/2015  . Cervical facet syndrome 09/02/2015  . DDD (degenerative disc disease), cervical 05/01/2015  . DDD (degenerative disc disease), lumbar 05/01/2015    Past Surgical History:  Procedure Laterality Date  . CARDIAC CATHETERIZATION  1999?   no issue/ DR Nehemiah Massed  . CATARACT EXTRACTION W/PHACO Left 02/04/2016   Procedure: CATARACT EXTRACTION PHACO AND INTRAOCULAR LENS PLACEMENT (IOC) LEFT EYE;  Surgeon: Leandrew Koyanagi, MD;  Location: [place];  Service: Ophthalmology;  Laterality: Left;  . CATARACT EXTRACTION W/PHACO Right 03/03/2016   Procedure: CATARACT EXTRACTION PHACO AND INTRAOCULAR LENS PLACEMENT (IOC);  Surgeon: Leandrew Koyanagi, MD;  Location: [place];  Service: Ophthalmology;  Laterality: Right;  . COLONOSCOPY    . EYE SURGERY  May and June   bilateral cataract  . ganglion cyst removal Right   . PILONIDAL CYST EXCISION      Prior to Admission medications   Medication Sig Start Date End Date Taking? Authorizing Provider  aspirin 325 MG tablet Take 325 mg by mouth daily. am    [provider]  diazepam (VALIUM) 2 MG tablet Take 1 tablet (2 mg total) by mouth every 8 (eight) hours as needed. 08/28/17   Cassadee Vanzandt B, FNP  docusate sodium (COLACE) 100 MG capsule Take 100 mg by mouth 2 (two) times daily. Am and dinner    [provider]  gabapentin (NEURONTIN) 300 MG capsule Limit 2-3  tablets by mouth per day or twice per day if tolerated 03/25/16   Mohammed Kindle, MD  Hypertonic Nasal Wash (SINUS RINSE KIT NA) Place into the nose as needed.    [provider]  ketorolac (TORADOL) 10 MG tablet Take 1 tablet (10 mg total) by mouth every 6 (six) hours as needed. 08/28/17   Daxx Tiggs B, FNP  latanoprost (XALATAN) 0.005 % ophthalmic solution Place 1 drop into both eyes at  bedtime.    [provider]  lisinopril-hydrochlorothiazide (PRINZIDE,ZESTORETIC) 20-25 MG tablet TAKE ONE TABLET BY MOUTH DAILY. 07/19/16   [provider]  metoprolol succinate (TOPROL-XL) 50 MG 24 hr tablet Take 50 mg by mouth daily.  05/20/17 05/20/18  [provider]  Multiple Vitamins-Minerals (ICAPS) CAPS Take 1 capsule by mouth daily.     [provider]  Multiple Vitamins-Minerals (PRESERVISION AREDS PO) Take 1 tablet by mouth 2 (two) times daily. Am and dinner    [provider]  mupirocin ointment (BACTROBAN) 2 % Apply two times a day for 7 days. 05/16/17   Marylene Land, NP  simvastatin (ZOCOR) 20 MG tablet Take 20 mg by mouth daily. bedtime    [provider]    Allergies Codeine; Vicodin [hydrocodone-acetaminophen]; and Celebrex [celecoxib]  Family History  Problem Relation Age of Onset  . Heart disease Father   . Dementia Mother   . Heart disease Brother   . Heart disease Brother   . Diabetes Brother     Social History Social History   Tobacco Use  . Smoking status: Never Smoker  . Smokeless tobacco: Never Used  Substance Use Topics  . Alcohol use: No    Alcohol/week: 0.0 oz  . Drug use: No    Review of Systems Constitutional: Negative for recent illness or injury Cardiovascular: Negative for chest pain Respiratory: Negative for shortness of breath Musculoskeletal: Positive for pain on both sides of neck. Skin: Negative for rash, lesion, or wound.  Neurological: Negative for paresthesias.  ____________________________________________   PHYSICAL EXAM:  VITAL SIGNS: ED Triage Vitals  Enc Vitals Group     BP 08/28/17 1136 (!) 153/83     Pulse Rate 08/28/17 1136 65     Resp 08/28/17 1136 16     Temp 08/28/17 1136 98.2 F (36.8 C)     Temp Source 08/28/17 1136 Oral     SpO2 08/28/17 1136 94 %     Weight 08/28/17 1137 225 lb (102.1 kg)     Height 08/28/17 1137 '5\' 10"'  (1.778 m)     Head  Circumference --      Peak Flow --      Pain Score 08/28/17 1135 6     Pain Loc --      Pain Edu? --      Excl. in [place]? --     Constitutional: Alert and oriented. Well appearing and in no acute distress. Eyes: Conjunctivae are clear without discharge or drainage Head: Atraumatic Neck: Limited movement in any direction secondary to pain. Tenderness on the right paracervical muscles with palpation. Tenderness on the left less than on the right paracervical spinal muscles. No focal midline tenderness. Respiratory: Respirations even and unlabored. Musculoskeletal: Full, active ROM of all extremities, specifically no restriction of the shoulders/arms. Neurologic: Motor and sensory function of all extremities is intact.  Skin: Intact without rash, lesion, or wound  Psychiatric: Behavior and affect are appropriate.  ____________________________________________   LABS (all labs ordered are listed, but only abnormal results  are displayed)  Labs Reviewed - No data to display ____________________________________________  RADIOLOGY  Cervical spine image does not reveal any acute bony abnormality per radiology. ____________________________________________   PROCEDURES  Procedures  ____________________________________________   INITIAL IMPRESSION / ASSESSMENT AND PLAN / ED COURSE  KAIEL WEIDE is a 71 y.o. male who presents to the emergency department for evaluation of neck pain. Symptoms most consistent with torticollis. Norflex ordered.  ----------------------------------------- 1:32 PM on 08/28/2017 -----------------------------------------  No improvement of pain after Norflex or Tramadol. IM Toradol and Valium 18m po ordered.   ----------------------------------------- 2:41 PM on 08/28/2017 -----------------------------------------  Significant improvement of ROM of neck, but continues to have pain on the right side more than the left. He will be discharged home with a  short course of toradol and valium. He is to follow up with his PCP for symptoms that are not improving over the next few days. He is to return to the ER for symptoms that change or worsen if unable to schedule an appointment.  Medications  orphenadrine (NORFLEX) injection 60 mg (60 mg Intramuscular Given 08/28/17 1219)  ketorolac (TORADOL) 30 MG/ML injection 30 mg (30 mg Intramuscular Given 08/28/17 1341)  diazepam (VALIUM) tablet 2 mg (2 mg Oral Given 08/28/17 1341)    Pertinent labs & imaging results that were available during my care of the patient were reviewed by me and considered in my medical decision making (see chart for details).  _________________________________________   FINAL CLINICAL IMPRESSION(S) / ED DIAGNOSES  Final diagnoses:  Torticollis, acute    ED Discharge Orders        Ordered    ketorolac (TORADOL) 10 MG tablet  Every 6 hours PRN     08/28/17 1420    diazepam (VALIUM) 2 MG tablet  Every 8 hours PRN     08/28/17 1420       If controlled substance prescribed during this visit, 12 month [facility] the [facility] to issuing an initial prescription for Schedule II or III opiod.    TVictorino Dike FNP 08/28/17 1453    QDelman Kitten MD 08/28/17 [phone]

## 2017-08-28 NOTE — ED Notes (Signed)
First Nurse Note: Pt states that he has been having neck and head pain since Wednesday. Pt states that the pain radiates down into both arms. Pt states that he is unable to move his head left or right due to the pain. Pt in NAD at this time.

## 2017-08-28 NOTE — ED Triage Notes (Signed)
Torticollis since weds morning after working with his arms raised. States started as a "crick" in his neck, now can't turn neck and both shoulders sore as well.

## 2017-10-24 ENCOUNTER — Other Ambulatory Visit: Payer: Self-pay

## 2017-10-24 ENCOUNTER — Ambulatory Visit
Admission: EM | Admit: 2017-10-24 | Discharge: 2017-10-24 | Disposition: A | Discharge status: Home / Self Care | Payer: Medicare Other | Attending: Family Medicine | Admitting: Family Medicine

## 2017-10-24 DIAGNOSIS — R69 Illness, unspecified: Secondary | ICD-10-CM | POA: Diagnosis not present

## 2017-10-24 DIAGNOSIS — R0981 Nasal congestion: Secondary | ICD-10-CM

## 2017-10-24 DIAGNOSIS — R05 Cough: Secondary | ICD-10-CM | POA: Diagnosis not present

## 2017-10-24 DIAGNOSIS — M791 Myalgia, unspecified site: Secondary | ICD-10-CM | POA: Diagnosis not present

## 2017-10-24 DIAGNOSIS — R6883 Chills (without fever): Secondary | ICD-10-CM | POA: Diagnosis not present

## 2017-10-24 DIAGNOSIS — J111 Influenza due to unidentified influenza virus with other respiratory manifestations: Secondary | ICD-10-CM

## 2017-10-24 MED ORDER — BENZONATATE 100 MG PO CAPS: 100.0000 mg | ORAL_CAPSULE | Freq: Three times a day (TID) | ORAL | 0 refills | Status: DC | PRN

## 2017-10-24 MED ORDER — FLUTICASONE PROPIONATE 50 MCG/ACT NA SUSP: 1.0000 | Freq: Every day | NASAL | 0 refills | Status: DC

## 2017-10-24 MED ORDER — OSELTAMIVIR PHOSPHATE 75 MG PO CAPS: 75.0000 mg | ORAL_CAPSULE | Freq: Two times a day (BID) | ORAL | 0 refills | Status: DC

## 2017-10-24 NOTE — Discharge Instructions (Signed)
Take medication as prescribed. Rest. Drink plenty of fluids.  ° °Follow up with your primary care physician this week as needed. Return to Urgent care for new or worsening concerns.  ° °

## 2017-10-24 NOTE — ED Triage Notes (Signed)
Patient complains of headaches, runny nose, eye drainage, body aches, cold chills, cough x sudden this morning.

## 2017-10-24 NOTE — ED Provider Notes (Signed)
[facility] ____________________________________________  Time seen: Approximately 951 PM  I have reviewed the triage vital signs and the nursing notes.   HISTORY  Chief Complaint URI   HPI Brandon Bright is a 72 y.o. male presenting for evaluation of runny nose, nasal congestion, some cough, some chills, body aches and slight weakness present all today.  States initially this morning he had a slight scratchy throat, but no sore throat and no throat discomfort at this time.  Reports yesterday he felt fine.  States his forehead feels congested along his sinuses.  States his wife recently had some complaints similar, but reports no fever or body ache.  No over-the-counter medications taken today for the same complaints.  Did receive his flu shot.  Reports has continued to eat and drink as normal.  No chest pain, shortness of breath, hemoptysis, abdominal pain, dysuria, extremity pain, extremity swelling, rash.Denies recent sickness. Denies recent antibiotic use.  Denies cardiac history.  Denies renal insufficiency  .Leonel Ramsay, MD: PCP   Past Medical History:  Diagnosis Date  . AGE (acute gastroenteritis) 08/19/2014  . Anemia    hx of  . ARF (acute renal failure) ([place]) 08/19/2014  . 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 Chronic kidney disease    acute renal failue?  . Colon polyps   . Diverticulitis   . Gallstones   . Hypercholesterolemia   . Hypertension    controlled on meds  . Hypokalemia 01/02/2016  . Peripheral vascular disease (HCC)    varicose veins  . 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 Lumbar radiculitis (Right) (L3 & L4) 07/19/2017  . Lumbar foraminal stenosis (Right: Severe L3-4 & L4-5) 07/19/2017  . Lumbar disc protrusion (Left: L2-3) (Central: L5-S1) 07/19/2017  . Lumbar facet arthropathy (Bilateral) 07/19/2017  . Lumbar spondylosis 08/09/2016  . Chronic midline low back pain  with right-sided sciatica (Secondary source of pain) 08/09/2016  . 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 Chronic neck pain [facility] source of pain) (Left) 08/09/2016  . Cholelithiasis 07/29/2016  . Degenerative arthritis of spine 07/29/2016  . Hyperlipidemia, unspecified 07/29/2016  . Hypertension 07/29/2016  . Iron deficiency anemia 07/29/2016  . Right bundle branch block 07/29/2016  . Varicose veins of lower extremity 07/29/2016  . Chronic lower extremity pain (Primary Source of Pain) (Right) 07/29/2016  . Chronic pain syndrome 07/29/2016  . Lumbar facet syndrome 09/02/2015  . Cervical facet syndrome 09/02/2015  . DDD (degenerative disc disease), cervical 05/01/2015  . DDD (degenerative disc disease), lumbar 05/01/2015    Past Surgical History:  Procedure Laterality Date  . CARDIAC CATHETERIZATION  1999?   no issue/ DR Nehemiah Massed  . CATARACT EXTRACTION W/PHACO Left 02/04/2016   Procedure: CATARACT EXTRACTION PHACO AND INTRAOCULAR LENS PLACEMENT (IOC) LEFT EYE;  Surgeon: Leandrew Koyanagi, MD;  Location: [place];  Service: Ophthalmology;  Laterality: Left;  . CATARACT EXTRACTION W/PHACO Right 03/03/2016   Procedure: CATARACT EXTRACTION PHACO AND INTRAOCULAR LENS PLACEMENT (IOC);  Surgeon: Leandrew Koyanagi, MD;  Location: [place];  Service: Ophthalmology;  Laterality: Right;  . COLONOSCOPY    . EYE SURGERY  May and June   bilateral cataract  . ganglion cyst removal Right   . PILONIDAL CYST EXCISION       No current facility-administered medications for this encounter.   Current Outpatient Medications:  .  aspirin 325 MG tablet, Take 325 mg by mouth  daily. am, Disp: , Rfl:  .  docusate sodium (COLACE) 100 MG capsule, Take 100 mg by mouth 2 (two) times daily. Am and dinner, Disp: , Rfl:  .  gabapentin (NEURONTIN) 300 MG capsule, Limit 2-3 tablets by mouth per day or twice per day if tolerated, Disp: 540 capsule, Rfl: 0 .  Hypertonic Nasal Wash  (SINUS RINSE KIT NA), Place into the nose as needed., Disp: , Rfl:  .  latanoprost (XALATAN) 0.005 % ophthalmic solution, Place 1 drop into both eyes at bedtime., Disp: , Rfl:  .  lisinopril-hydrochlorothiazide (PRINZIDE,ZESTORETIC) 20-25 MG tablet, TAKE ONE TABLET BY MOUTH DAILY., Disp: , Rfl:  .  metoprolol succinate (TOPROL-XL) 50 MG 24 hr tablet, Take 50 mg by mouth daily. , Disp: , Rfl:  .  Multiple Vitamins-Minerals (ICAPS) CAPS, Take 1 capsule by mouth daily. , Disp: , Rfl:  .  Multiple Vitamins-Minerals (PRESERVISION AREDS PO), Take 1 tablet by mouth 2 (two) times daily. Am and dinner, Disp: , Rfl:  .  simvastatin (ZOCOR) 20 MG tablet, Take 20 mg by mouth daily. bedtime, Disp: , Rfl:  .  benzonatate (TESSALON PERLES) 100 MG capsule, Take 1 capsule (100 mg total) by mouth 3 (three) times daily as needed for cough., Disp: 15 capsule, Rfl: 0 .  fluticasone (FLONASE) 50 MCG/ACT nasal spray, Place 1 spray into both nostrils daily., Disp: 1 g, Rfl: 0 .  oseltamivir (TAMIFLU) 75 MG capsule, Take 1 capsule (75 mg total) by mouth every 12 (twelve) hours., Disp: 10 capsule, Rfl: 0  Allergies Codeine; Vicodin [hydrocodone-acetaminophen]; and Celebrex [celecoxib]  Family History  Problem Relation Age of Onset  . Heart disease Father   . Dementia Mother   . Heart disease Brother   . Heart disease Brother   . Diabetes Brother     Social History Social History   Tobacco Use  . Smoking status: Never Smoker  . Smokeless tobacco: Never Used  Substance Use Topics  . Alcohol use: No    Alcohol/week: 0.0 oz  . Drug use: No    Review of Systems Constitutional: AS above. Denies known fever. Eyes: No visual changes. ENT: As above.  Cardiovascular: Denies chest pain. Respiratory: Denies shortness of breath. Gastrointestinal: No abdominal pain.  No nausea, no vomiting.  No diarrhea.   Genitourinary: Negative for dysuria. Musculoskeletal: Negative for back pain. Skin: Negative for  rash.  ____________________________________________   PHYSICAL EXAM:  VITAL SIGNS: ED Triage Vitals  Enc Vitals Group     BP 10/24/17 1613 (!) 160/84     Pulse Rate 10/24/17 1613 76     Resp 10/24/17 1613 17     Temp 10/24/17 1613 98.5 F (36.9 C)     Temp Source 10/24/17 1613 Oral     SpO2 10/24/17 1613 97 %     Weight 10/24/17 1610 210 lb (95.3 kg)     Height 10/24/17 1610 '5\' 10"'  (1.778 m)     Head Circumference --      Peak Flow --      Pain Score 10/24/17 1610 8     Pain Loc --      Pain Edu? --      Excl. in [place]? --     Constitutional: Alert and oriented. Well appearing and in no acute distress. Eyes: Conjunctivae are normal. Head: Atraumatic. No sinus tenderness to palpation. No swelling. No erythema.  Ears: no erythema, normal TMs bilaterally.   Nose:Nasal congestion with clear rhinorrhea  Mouth/Throat: Mucous membranes are  moist. No pharyngeal erythema. No tonsillar swelling or exudate.  Neck: No stridor.  No cervical spine tenderness to palpation. Hematological/Lymphatic/Immunilogical: No cervical lymphadenopathy. Cardiovascular: Normal rate, regular rhythm. Grossly normal heart sounds.  Good peripheral circulation. Respiratory: Normal respiratory effort.  No retractions. No wheezes, rales or rhonchi. Good air movement.  Musculoskeletal: Ambulatory with steady gait.  Neurologic:  Normal speech and language. No gait instability. Skin:  Skin appears warm, dry and intact. No rash noted. Psychiatric: Mood and affect are normal. Speech and behavior are normal.  ___________________________________________   LABS (all labs ordered are listed, but only abnormal results are displayed)  Labs Reviewed - No data to display ____________________________________________    PROCEDURES Procedures    INITIAL IMPRESSION / ASSESSMENT AND PLAN / ED COURSE  Pertinent labs & imaging results that were available during my care of the patient were reviewed by me and considered  in my medical decision making (see chart for details).  Very well-appearing patient.  No acute distress.  Lungs clear throughout.  Denies sore throat.  Suspect viral illness, concern for influenza-like.  Discussed in detail with patient.  Discussed empiric use of Tamiflu.  PRN Tessalon Perles and Flonase.  Encourage rest, fluids, supportive care.  Discussed very strict follow-up and return parameters.Discussed indication, risks and benefits of medications with patient.  Discussed follow up with Primary care physician this week. Discussed follow up and return parameters including no resolution or any worsening concerns. Patient verbalized understanding and agreed to plan.   ____________________________________________   FINAL CLINICAL IMPRESSION(S) / ED DIAGNOSES  Final diagnoses:  Influenza-like illness     ED Discharge Orders        Ordered    fluticasone (FLONASE) 50 MCG/ACT nasal spray  Daily     10/24/17 1712    oseltamivir (TAMIFLU) 75 MG capsule  Every 12 hours     10/24/17 1712    benzonatate (TESSALON PERLES) 100 MG capsule  3 times daily PRN     10/24/17 1712       Note: This dictation was prepared with Dragon dictation along with smaller phrase technology. Any transcriptional errors that result from this process are unintentional.         Marylene Land, NP 10/24/17 1943

## 2018-04-04 ENCOUNTER — Ambulatory Visit: Payer: Medicare Other | Admitting: Urology

## 2018-04-04 ENCOUNTER — Encounter: Payer: Self-pay | Admitting: Urology

## 2018-04-04 VITALS — BP 128/73 | HR 69 | Ht 70.0 in | Wt 234.8 lb

## 2018-04-04 DIAGNOSIS — R3912 Poor urinary stream: Secondary | ICD-10-CM | POA: Diagnosis not present

## 2018-04-04 DIAGNOSIS — R339 Retention of urine, unspecified: Secondary | ICD-10-CM | POA: Diagnosis not present

## 2018-04-04 DIAGNOSIS — N401 Enlarged prostate with lower urinary tract symptoms: Secondary | ICD-10-CM

## 2018-04-04 LAB — BLADDER SCAN AMB NON-IMAGING

## 2018-04-04 NOTE — Progress Notes (Signed)
04/04/2018 1:48 PM   Brandon Bright 11-09-1945 [id number]  Referring provider: Donnamarie Rossetti, PA-C [address] [place], [place] [address]  Chief Complaint  Patient presents with  . Other    HPI: Brandon Bright is a 72 year old male seen at the request of Grayland Ormond for evaluation of urinary retention.  Approximately 2 months ago he noted a distinct change in his voiding pattern.  He has not noted decreased pattern in the number of times he voids per day and does note a weaker urinary stream.  He typically gets up 0-1 time per night however has had 2 episodes where he got up 5-6 times and voided small amounts.  He denies flank, abdominal or pelvic pain.  His creatinine in May 2019 was 1.1.  He denies dysuria or gross hematuria.  His symptoms are not bothersome however he wanted to be evaluated to make sure there were no significant problems.  He was given a trial of Cipro by his PCP in mid May which did not change his symptoms.   PMH: Past Medical History:  Diagnosis Date  . AGE (acute gastroenteritis) 08/19/2014  . Anemia    hx of  . ARF (acute renal failure) ([facility]) 08/19/2014  . 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 Chronic kidney disease    acute renal failue?  . Colon polyps   . Diverticulitis   . Gallstones   . Hypercholesterolemia   . Hypertension    controlled on meds  . Hypokalemia 01/02/2016  . Peripheral vascular disease (HCC)    varicose veins  . 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 CARDIAC CATHETERIZATION  1999?   no issue/ DR Nehemiah Massed  . CATARACT EXTRACTION W/PHACO Left 02/04/2016   Procedure: CATARACT EXTRACTION PHACO AND INTRAOCULAR LENS PLACEMENT (IOC) LEFT EYE;  Surgeon: Leandrew Koyanagi, MD;  Location: [facility];  Service: Ophthalmology;  Laterality: Left;  . CATARACT EXTRACTION W/PHACO Right 03/03/2016   Procedure: CATARACT EXTRACTION PHACO AND  INTRAOCULAR LENS PLACEMENT (IOC);  Surgeon: Leandrew Koyanagi, MD;  Location: [facility];  Service: Ophthalmology;  Laterality: Right;  . COLONOSCOPY    . EYE SURGERY  May and June   bilateral cataract  . ganglion cyst removal Right   . PILONIDAL CYST EXCISION      Home Medications:  Allergies as of 04/04/2018      Reactions   Codeine Swelling, Rash   throat   Vicodin [hydrocodone-acetaminophen] Swelling   throat   Celebrex [celecoxib]    Abdominal pain      Medication List        Accurate as of 04/04/18  1:48 PM. Always use your most recent med list.          aspirin 325 MG tablet Take 325 mg by mouth daily. am   benzonatate 100 MG capsule Commonly known as:  TESSALON PERLES Take 1 capsule (100 mg total) by mouth 3 (three) times daily as needed for cough.   docusate sodium 100 MG capsule Commonly known as:  COLACE Take 100 mg by mouth 2 (two) times daily. Am and dinner   fluticasone 50 MCG/ACT nasal spray Commonly known as:  FLONASE Place 1 spray into both nostrils daily.   gabapentin 300 MG capsule Commonly known as:  NEURONTIN Limit 2-3 tablets by mouth per day or twice per day if tolerated   latanoprost 0.005 % ophthalmic solution Commonly known  as:  XALATAN Place 1 drop into both eyes at bedtime.   lisinopril-hydrochlorothiazide 20-25 MG tablet Commonly known as:  PRINZIDE,ZESTORETIC TAKE ONE TABLET BY MOUTH DAILY.   metoprolol succinate 50 MG 24 hr tablet Commonly known as:  TOPROL-XL Take 50 mg by mouth daily.   oseltamivir 75 MG capsule Commonly known as:  TAMIFLU Take 1 capsule (75 mg total) by mouth every 12 (twelve) hours.   PRESERVISION AREDS PO Take 1 tablet by mouth 2 (two) times daily. Am and dinner   ICAPS Caps Take 1 capsule by mouth daily.   simvastatin 20 MG tablet Commonly known as:  ZOCOR Take 20 mg by mouth daily. bedtime   SINUS RINSE KIT NA Place into the nose as needed.       Allergies:  Allergies    Allergen Reactions  . Codeine Swelling and Rash    throat  . Vicodin [Hydrocodone-Acetaminophen] Swelling    throat  . Celebrex [Celecoxib]     Abdominal pain    Family History: Family History  Problem Relation Age of Onset  . Heart disease Father   . Dementia Mother   . Heart disease Brother   . Heart disease Brother   . Diabetes Brother     Social History:  reports that he has never smoked. He has never used smokeless tobacco. He reports that he does not drink alcohol or use drugs.  ROS: UROLOGY Frequent Urination?: No Hard to postpone urination?: No Burning/pain with urination?: No Get up at night to urinate?: Yes Leakage of urine?: No Urine stream starts and stops?: No Trouble starting stream?: No Do you have to strain to urinate?: No Blood in urine?: No Urinary tract infection?: No Sexually transmitted disease?: No Injury to kidneys or bladder?: No Painful intercourse?: No Weak stream?: Yes Erection problems?: Yes Penile pain?: No  Gastrointestinal Nausea?: No Vomiting?: No Indigestion/heartburn?: No Diarrhea?: No Constipation?: No  Constitutional Fever: No Night sweats?: No Weight loss?: No Fatigue?: No  Skin Skin rash/lesions?: No Itching?: No  Eyes Blurred vision?: No Double vision?: No  Ears/Nose/Throat Sore throat?: No Sinus problems?: No  Hematologic/Lymphatic Swollen glands?: No Easy bruising?: No  Cardiovascular Leg swelling?: No Chest pain?: No  Respiratory Cough?: No Shortness of breath?: No  Endocrine Excessive thirst?: No  Musculoskeletal Back pain?: No Joint pain?: No  Neurological Headaches?: No Dizziness?: No  Psychologic Depression?: No Anxiety?: No  Physical Exam: BP 128/73 (BP Location: Left Arm, Patient Position: Sitting, Cuff Size: Large)   Pulse 69   Ht 5' 10" (1.778 m)   Wt 234 lb 12.8 oz (106.5 kg)   BMI 33.69 kg/m   Constitutional:  Alert and oriented, No acute distress. HEENT: [place] AT,  moist mucus membranes.  Trachea midline, no masses. Cardiovascular: No clubbing, cyanosis, or edema. Respiratory: Normal respiratory effort, no increased work of breathing. GI: Abdomen is soft, nontender, nondistended, no abdominal masses GU: No CVA tenderness.  Prostate 35 g, smooth without nodules Lymph: No cervical or inguinal lymphadenopathy. Skin: No rashes, bruises or suspicious lesions. Neurologic: Grossly intact, no focal deficits, moving all 4 extremities. Psychiatric: Normal mood and affect.  Laboratory Data: Lab Results  Component Value Date   WBC 4.6 08/17/2014   HGB 13.0 08/17/2014   HCT 38.9 (L) 08/17/2014   MCV 95 08/17/2014   PLT 163 08/17/2014    Lab Results  Component Value Date   CREATININE 1.16 08/17/2014     Assessment & Plan:   72 year old male who has noted a  change in his voiding pattern however his symptoms are not bothersome.  His creatinine is normal, exam unremarkable and PVR by bladder scan today was 0 mL.  He was informed his symptoms are most likely secondary to benign prostate enlargement.  We discussed options of observation versus medical management.  He does not desire medical management as his symptoms are not bothersome.  We will see him annually and he was instructed to call earlier for any significant change in his voiding pattern.   Abbie Sons, [place] [address] [place], [place] [address] [phone]

## 2018-04-05 ENCOUNTER — Encounter: Payer: Self-pay | Admitting: Urology

## 2018-04-05 DIAGNOSIS — R3912 Poor urinary stream: Principal | ICD-10-CM

## 2018-04-05 DIAGNOSIS — N401 Enlarged prostate with lower urinary tract symptoms: Secondary | ICD-10-CM | POA: Insufficient documentation

## 2018-04-05 DIAGNOSIS — R351 Nocturia: Secondary | ICD-10-CM | POA: Insufficient documentation

## 2018-05-11 ENCOUNTER — Ambulatory Visit: Payer: Self-pay | Admitting: Urology

## 2018-05-23 ENCOUNTER — Other Ambulatory Visit
Admission: RE | Admit: 2018-05-23 | Discharge: 2018-05-23 | Disposition: A | Discharge status: Home / Self Care | Payer: Medicare Other | Source: Ambulatory Visit | Attending: Student | Admitting: Student

## 2018-05-23 DIAGNOSIS — R197 Diarrhea, unspecified: Secondary | ICD-10-CM | POA: Diagnosis present

## 2018-05-23 LAB — GASTROINTESTINAL PANEL BY PCR, STOOL (REPLACES STOOL CULTURE)
ASTROVIRUS: NOT DETECTED
Adenovirus F40/41: NOT DETECTED
CAMPYLOBACTER SPECIES: NOT DETECTED
Cryptosporidium: NOT DETECTED
Cyclospora cayetanensis: NOT DETECTED
ENTAMOEBA HISTOLYTICA: NOT DETECTED
ENTEROTOXIGENIC E COLI (ETEC): NOT DETECTED
Enteroaggregative E coli (EAEC): NOT DETECTED
Enteropathogenic E coli (EPEC): NOT DETECTED
Giardia lamblia: NOT DETECTED
NOROVIRUS GI/GII: NOT DETECTED
PLESIMONAS SHIGELLOIDES: NOT DETECTED
ROTAVIRUS A: NOT DETECTED
SAPOVIRUS (I, II, IV, AND V): NOT DETECTED
SHIGA LIKE TOXIN PRODUCING E COLI (STEC): NOT DETECTED
SHIGELLA/ENTEROINVASIVE E COLI (EIEC): NOT DETECTED
Salmonella species: NOT DETECTED
Vibrio cholerae: NOT DETECTED
Vibrio species: NOT DETECTED
Yersinia enterocolitica: NOT DETECTED

## 2018-05-23 LAB — C DIFFICILE QUICK SCREEN W PCR REFLEX
C DIFFICILE (CDIFF) INTERP: NOT DETECTED
C DIFFICILE (CDIFF) TOXIN: NEGATIVE
C DIFFICLE (CDIFF) ANTIGEN: NEGATIVE

## 2018-05-25 ENCOUNTER — Ambulatory Visit
Admission: EM | Admit: 2018-05-25 | Discharge: 2018-05-25 | Disposition: A | Discharge status: Home / Self Care | Payer: Medicare Other | Attending: Family Medicine | Admitting: Family Medicine

## 2018-05-25 ENCOUNTER — Encounter: Payer: Self-pay | Admitting: Emergency Medicine

## 2018-05-25 ENCOUNTER — Other Ambulatory Visit: Payer: Self-pay

## 2018-05-25 DIAGNOSIS — J069 Acute upper respiratory infection, unspecified: Secondary | ICD-10-CM | POA: Diagnosis not present

## 2018-05-25 LAB — CALPROTECTIN, FECAL: Calprotectin, Fecal: 25 ug/g (ref 0–120)

## 2018-05-25 MED ORDER — AZITHROMYCIN 250 MG PO TABS: 250.0000 mg | ORAL_TABLET | Freq: Every day | ORAL | 0 refills | Status: DC

## 2018-05-25 MED ORDER — FLUTICASONE PROPIONATE 50 MCG/ACT NA SUSP: 2.0000 | Freq: Every day | NASAL | 0 refills | Status: DC

## 2018-05-25 MED ORDER — CHLORPHENIRAMINE MALEATE 4 MG PO TABS: 4.0000 mg | ORAL_TABLET | Freq: Two times a day (BID) | ORAL | 0 refills | Status: DC | PRN

## 2018-05-25 NOTE — ED Triage Notes (Addendum)
Patient in today c/o not feeling well. He states he has a cold and now runny nose and sinus/head pressure since yesterday evening. Patient denies fever. Patient has not tried any OTC medications.

## 2018-05-25 NOTE — ED Provider Notes (Addendum)
[facility]    CSN: [id number] Arrival date & time: 05/25/18  0920     History   Chief Complaint Chief Complaint  Patient presents with  . Nasal Congestion    HPI Brandon Bright is a 72 y.o. male.   HPI   72 year old male presents with fatigue and symptoms of a head cold with runny nose, sinus head pressure over the frontal sinuses, that he started having yesterday.  He said no fever or chills.  He denies cough. He has not tried any medications so far.  [place] which [place] contributing to his symptoms        Past Medical History:  Diagnosis Date  . AGE (acute gastroenteritis) 08/19/2014  . Anemia    hx of  . ARF (acute renal failure) ([place]) 08/19/2014  . 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 Chronic kidney disease    acute renal failue?  . Colon polyps   . Diverticulitis   . Gallstones   . Hypercholesterolemia   . Hypertension    controlled on meds  . Hypokalemia 01/02/2016  . Peripheral vascular disease (HCC)    varicose veins  . Sclerosing mesenteritis ([facility] [facility]) 12/15/2015    Patient Active Problem List   Diagnosis Date Noted  . Benign prostatic hyperplasia with weak urinary stream 04/05/2018  . Lumbar radiculitis (Right) (L3 & L4) 07/19/2017  . Lumbar foraminal stenosis (Right: Severe L3-4 & L4-5) 07/19/2017  . Lumbar disc protrusion (Left: L2-3) (Central: L5-S1) 07/19/2017  . Lumbar facet arthropathy (Bilateral) 07/19/2017  . Lumbar spondylosis 08/09/2016  . Chronic midline low back pain with right-sided sciatica (Secondary source of pain) 08/09/2016  . 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 Chronic neck pain [facility] source of pain) (Left) 08/09/2016  . Cholelithiasis 07/29/2016  . Degenerative arthritis of spine 07/29/2016  . Hyperlipidemia, unspecified 07/29/2016  . Hypertension 07/29/2016  . Iron deficiency anemia 07/29/2016  . Right bundle branch block 07/29/2016  .  Varicose veins of lower extremity 07/29/2016  . Chronic lower extremity pain (Primary Source of Pain) (Right) 07/29/2016  . Chronic pain syndrome 07/29/2016  . Lumbar facet syndrome 09/02/2015  . Cervical facet syndrome 09/02/2015  . DDD (degenerative disc disease), cervical 05/01/2015  . DDD (degenerative disc disease), lumbar 05/01/2015    Past Surgical History:  Procedure Laterality Date  . CARDIAC CATHETERIZATION  1999?   no issue/ DR Nehemiah Massed  . CATARACT EXTRACTION W/PHACO Left 02/04/2016   Procedure: CATARACT EXTRACTION PHACO AND INTRAOCULAR LENS PLACEMENT (IOC) LEFT EYE;  Surgeon: Leandrew Koyanagi, MD;  Location: [facility];  Service: Ophthalmology;  Laterality: Left;  . CATARACT EXTRACTION W/PHACO Right 03/03/2016   Procedure: CATARACT EXTRACTION PHACO AND INTRAOCULAR LENS PLACEMENT (IOC);  Surgeon: Leandrew Koyanagi, MD;  Location: [facility];  Service: Ophthalmology;  Laterality: Right;  . COLONOSCOPY    . EYE SURGERY  May and June   bilateral cataract  . ganglion cyst removal Right   . PILONIDAL CYST EXCISION         Home Medications    Prior to Admission medications   Medication Sig Start Date End Date Taking? Authorizing Provider  aspirin 325 MG tablet Take 325 mg by mouth daily. am   Yes [provider]  docusate sodium (COLACE) 100 MG capsule Take 100 mg by mouth 2 (two) times daily. Am and dinner   Yes [provider]  gabapentin (NEURONTIN) 300  MG capsule Limit 2-3 tablets by mouth per day or twice per day if tolerated 03/25/16  Yes Mohammed Kindle, MD  Hypertonic Nasal Wash (SINUS RINSE KIT NA) Place into the nose as needed.   Yes [provider]  latanoprost (XALATAN) 0.005 % ophthalmic solution Place 1 drop into both eyes at bedtime.   Yes [provider]  lisinopril-hydrochlorothiazide (PRINZIDE,ZESTORETIC) 20-25 MG tablet TAKE ONE TABLET BY MOUTH DAILY. 07/19/16  Yes [provider]  metoprolol  succinate (TOPROL-XL) 50 MG 24 hr tablet Take 50 mg by mouth daily.  05/20/17 05/25/18 Yes [provider]  Multiple Vitamins-Minerals (ICAPS) CAPS Take 1 capsule by mouth daily.    Yes [provider]  Multiple Vitamins-Minerals (PRESERVISION AREDS PO) Take 1 tablet by mouth 2 (two) times daily. Am and dinner   Yes [provider]  simvastatin (ZOCOR) 20 MG tablet Take 20 mg by mouth daily. bedtime   Yes [provider]  azithromycin (ZITHROMAX) 250 MG tablet Take 1 tablet (250 mg total) by mouth daily. Take first 2 tablets together, then 1 every day until finished. 06/01/18   Lorin Picket, PA-C  chlorpheniramine (CHLOR-TRIMETON) 4 MG tablet Take 1 tablet (4 mg total) by mouth 2 (two) times daily as needed for allergies. 05/25/18   Lorin Picket, PA-C  fluticasone (FLONASE) 50 MCG/ACT nasal spray Place 2 sprays into both nostrils daily. 05/25/18   Lorin Picket, PA-C    Family History Family History  Problem Relation Age of Onset  . Heart disease Father   . Dementia Mother   . Heart disease Brother   . Heart disease Brother   . Diabetes Brother     Social History Social History   Tobacco Use  . Smoking status: Never Smoker  . Smokeless tobacco: Never Used  Substance Use Topics  . Alcohol use: No    Alcohol/week: 0.0 standard drinks  . Drug use: No     Allergies   Codeine; Vicodin [hydrocodone-acetaminophen]; and Celebrex [celecoxib]   Review of Systems Review of Systems  Constitutional: Positive for activity change. Negative for chills, fatigue and fever.  HENT: Positive for congestion, postnasal drip, rhinorrhea, sinus pressure and sinus pain.   Respiratory: Negative for cough, shortness of breath, wheezing and stridor.   All other systems reviewed and are negative.    Physical Exam Triage Vital Signs ED Triage Vitals  Enc Vitals Group     BP 05/25/18 0930 (!) 158/75     Pulse Rate 05/25/18 0930 88     Resp 05/25/18 0930  16     Temp 05/25/18 0930 97.8 F (36.6 C)     Temp Source 05/25/18 0930 Oral     SpO2 05/25/18 0930 96 %     Weight 05/25/18 0931 225 lb (102.1 kg)     Height 05/25/18 0931 _0  (1.778 m)     Head Circumference --      Peak Flow --      Pain Score 05/25/18 0931 5     Pain Loc --      Pain Edu? --      Excl. in [place]? --    No data found.  Updated Vital Signs BP (!) 158/75 (BP Location: Left Arm)   Pulse 88   Temp 97.8 F (36.6 C) (Oral)   Resp 16   Ht _1  (1.778 m)   Wt 225 lb (102.1 kg)   SpO2 96%   BMI 32.28 kg/m   Visual  Acuity Right Eye Distance:   Left Eye Distance:   Bilateral Distance:    Right Eye Near:   Left Eye Near:    Bilateral Near:     Physical Exam  Constitutional: He is oriented to person, place, and time. He appears well-developed and well-nourished. No distress.  HENT:  Head: Normocephalic.  Right Ear: External ear normal.  Left Ear: External ear normal.  Nose: Nose normal.  Mouth/Throat: Oropharynx is clear and moist. No oropharyngeal exudate.  Eyes: Pupils are equal, round, and reactive to light. Right eye exhibits no discharge. Left eye exhibits no discharge.  Neck: Normal range of motion.  Pulmonary/Chest: Effort normal and breath sounds normal.  Musculoskeletal: Normal range of motion.  Lymphadenopathy:    He has no cervical adenopathy.  Neurological: He is alert and oriented to person, place, and time.  Skin: Skin is warm and dry. He is not diaphoretic.  Psychiatric: He has a normal mood and affect. His behavior is normal. Thought content normal.  Nursing note and vitals reviewed.    UC Treatments / Results  Labs (all labs ordered are listed, but only abnormal results are displayed) Labs Reviewed - No data to display  EKG None  Radiology No results found.  Procedures Procedures (including critical care time)  Medications Ordered in UC Medications - No data to display  Initial Impression / Assessment and Plan / UC  Course  I have reviewed the triage vital signs and the nursing notes.  Pertinent labs & imaging results that were available during my care of the patient were reviewed by me and considered in my medical decision making (see chart for details).     Plan: 1. Test/x-ray results and diagnosis reviewed with patient 2. rx as per orders; risks, benefits, potential side effects reviewed with patient 3. Recommend supportive treatment with Flonase nasal spray on a daily basis for 2 to 3 weeks.  Use Afrin nasal spray for 2 days only at the same time of using the Flonase.  Because his blood pressure, I have recommended Chlor-Trimeton HBP to help with drying up of his sinus area.  Told him this is likely a viral illness and does not require antibiotics.  Provide him a prescription for azithromycin as a wait and see drug.  If he is still having his symptoms in 10 days he may take that.  Had a discussion of CDC recommendations for antibiotics early in the illness.  He understands and agrees. 4. F/u prn if symptoms worsen or don't improve  Final Clinical Impressions(s) / UC Diagnoses   Final diagnoses:  Upper respiratory tract infection, unspecified type   Discharge Instructions   None    ED Prescriptions    Medication Sig Dispense Auth. Provider   azithromycin (ZITHROMAX) 250 MG tablet Take 1 tablet (250 mg total) by mouth daily. Take first 2 tablets together, then 1 every day until finished. 6 tablet Lorin Picket, PA-C   fluticasone (FLONASE) 50 MCG/ACT nasal spray Place 2 sprays into both nostrils daily. 16 g Crecencio Mc P, PA-C   chlorpheniramine (CHLOR-TRIMETON) 4 MG tablet Take 1 tablet (4 mg total) by mouth 2 (two) times daily as needed for allergies. 14 tablet Lorin Picket, PA-C     Controlled Substance Prescriptions [place] Controlled Substance Registry consulted? Not Applicable   Lorin Picket, PA-C 05/25/18 1042    Lorin Picket, PA-C 05/25/18 1044

## 2018-05-27 ENCOUNTER — Emergency Department
Admission: EM | Admit: 2018-05-27 | Discharge: 2018-05-27 | Disposition: A | Discharge status: Home / Self Care | Payer: Medicare Other | Attending: Emergency Medicine | Admitting: Emergency Medicine

## 2018-05-27 ENCOUNTER — Emergency Department: Payer: Medicare Other

## 2018-05-27 ENCOUNTER — Encounter: Payer: Self-pay | Admitting: Emergency Medicine

## 2018-05-27 DIAGNOSIS — N189 Chronic kidney disease, unspecified: Secondary | ICD-10-CM | POA: Diagnosis not present

## 2018-05-27 DIAGNOSIS — M436 Torticollis: Secondary | ICD-10-CM | POA: Diagnosis not present

## 2018-05-27 DIAGNOSIS — Z7982 Long term (current) use of aspirin: Secondary | ICD-10-CM | POA: Insufficient documentation

## 2018-05-27 DIAGNOSIS — M542 Cervicalgia: Secondary | ICD-10-CM | POA: Diagnosis present

## 2018-05-27 DIAGNOSIS — Z79899 Other long term (current) drug therapy: Secondary | ICD-10-CM | POA: Insufficient documentation

## 2018-05-27 MED ORDER — KETOROLAC TROMETHAMINE 30 MG/ML IJ SOLN
30.0000 mg | Freq: Once | INTRAMUSCULAR | Status: AC
  Administered 2018-05-27: 30 mg via INTRAMUSCULAR
  Filled 2018-05-27: qty 1

## 2018-05-27 MED ORDER — DIAZEPAM 2 MG PO TABS
2.0000 mg | ORAL_TABLET | Freq: Once | ORAL | Status: AC
  Administered 2018-05-27: 2 mg via ORAL
  Filled 2018-05-27: qty 1

## 2018-05-27 MED ORDER — DIAZEPAM 2 MG PO TABS: 2.0000 mg | ORAL_TABLET | Freq: Two times a day (BID) | ORAL | 0 refills | Status: AC | PRN

## 2018-05-27 NOTE — ED Provider Notes (Signed)
[facility] Emergency Department Provider Note  ____________________________________________  Time seen: Approximately 6:43 PM  I have reviewed the triage vital signs and the nursing notes.   HISTORY  Chief Complaint No chief complaint on file.    HPI Brandon Bright is a 72 y.o. male presents to the emergency department with 10 out of 10 aching and stiff neck pain that started 1 day ago.  Patient reports that neck pain originally started on the left side but now occurs on the right.  Patient reports that he experienced similar symptoms approximately 1 year ago and was treated with a muscle relaxer.  Patient has had some tingling in his right hand but no other numbness or changes in sensation.  Patient denies falls or mechanisms of trauma.  Patient denies prior neck injuries in the past.  No alleviating measures have been attempted.    Past Medical History:  Diagnosis Date  . AGE (acute gastroenteritis) 08/19/2014  . Anemia    hx of  . ARF (acute renal failure) ([place]) 08/19/2014  . 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 Chronic kidney disease    acute renal failue?  . Colon polyps   . Diverticulitis   . Gallstones   . Hypercholesterolemia   . Hypertension    controlled on meds  . Hypokalemia 01/02/2016  . Peripheral vascular disease (HCC)    varicose veins  . 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 Benign prostatic hyperplasia with weak urinary stream 04/05/2018  . Lumbar radiculitis (Right) (L3 & L4) 07/19/2017  . Lumbar foraminal stenosis (Right: Severe L3-4 & L4-5) 07/19/2017  . Lumbar disc protrusion (Left: L2-3) (Central: L5-S1) 07/19/2017  . Lumbar facet arthropathy (Bilateral) 07/19/2017  . Lumbar spondylosis 08/09/2016  . Chronic midline low back pain with right-sided sciatica (Secondary source of pain) 08/09/2016  . 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 Chronic neck pain  [facility] source of pain) (Left) 08/09/2016  . Cholelithiasis 07/29/2016  . Degenerative arthritis of spine 07/29/2016  . Hyperlipidemia, unspecified 07/29/2016  . Hypertension 07/29/2016  . Iron deficiency anemia 07/29/2016  . Right bundle branch block 07/29/2016  . Varicose veins of lower extremity 07/29/2016  . Chronic lower extremity pain (Primary Source of Pain) (Right) 07/29/2016  . Chronic pain syndrome 07/29/2016  . Lumbar facet syndrome 09/02/2015  . Cervical facet syndrome 09/02/2015  . DDD (degenerative disc disease), cervical 05/01/2015  . DDD (degenerative disc disease), lumbar 05/01/2015    Past Surgical History:  Procedure Laterality Date  . CARDIAC CATHETERIZATION  1999?   no issue/ DR Nehemiah Massed  . CATARACT EXTRACTION W/PHACO Left 02/04/2016   Procedure: CATARACT EXTRACTION PHACO AND INTRAOCULAR LENS PLACEMENT (IOC) LEFT EYE;  Surgeon: Leandrew Koyanagi, MD;  Location: [place];  Service: Ophthalmology;  Laterality: Left;  . CATARACT EXTRACTION W/PHACO Right 03/03/2016   Procedure: CATARACT EXTRACTION PHACO AND INTRAOCULAR LENS PLACEMENT (IOC);  Surgeon: Leandrew Koyanagi, MD;  Location: [place];  Service: Ophthalmology;  Laterality: Right;  . COLONOSCOPY    . EYE SURGERY  May and June   bilateral cataract  . ganglion cyst removal Right   . PILONIDAL CYST EXCISION      Prior to Admission medications   Medication Sig Start Date End Date Taking? Authorizing Provider  aspirin 325 MG tablet Take 325 mg by mouth daily. am    [provider]  azithromycin (ZITHROMAX) 250 MG tablet  Take 1 tablet (250 mg total) by mouth daily. Take first 2 tablets together, then 1 every day until finished. 06/01/18   Lorin Picket, PA-C  chlorpheniramine (CHLOR-TRIMETON) 4 MG tablet Take 1 tablet (4 mg total) by mouth 2 (two) times daily as needed for allergies. 05/25/18   Lorin Picket, PA-C  diazepam (VALIUM) 2 MG tablet Take 1 tablet (2 mg total) by  mouth every 12 (twelve) hours as needed for up to 5 days for anxiety. 05/27/18 06/01/18  Lannie Fields, PA-C  docusate sodium (COLACE) 100 MG capsule Take 100 mg by mouth 2 (two) times daily. Am and dinner    [provider]  fluticasone (FLONASE) 50 MCG/ACT nasal spray Place 2 sprays into both nostrils daily. 05/25/18   Lorin Picket, PA-C  gabapentin (NEURONTIN) 300 MG capsule Limit 2-3 tablets by mouth per day or twice per day if tolerated 03/25/16   Mohammed Kindle, MD  Hypertonic Nasal Wash (SINUS RINSE KIT NA) Place into the nose as needed.    [provider]  latanoprost (XALATAN) 0.005 % ophthalmic solution Place 1 drop into both eyes at bedtime.    [provider]  lisinopril-hydrochlorothiazide (PRINZIDE,ZESTORETIC) 20-25 MG tablet TAKE ONE TABLET BY MOUTH DAILY. 07/19/16   [provider]  metoprolol succinate (TOPROL-XL) 50 MG 24 hr tablet Take 50 mg by mouth daily.  05/20/17 05/25/18  [provider]  Multiple Vitamins-Minerals (ICAPS) CAPS Take 1 capsule by mouth daily.     [provider]  Multiple Vitamins-Minerals (PRESERVISION AREDS PO) Take 1 tablet by mouth 2 (two) times daily. Am and dinner    [provider]  simvastatin (ZOCOR) 20 MG tablet Take 20 mg by mouth daily. bedtime    [provider]    Allergies Codeine; Vicodin [hydrocodone-acetaminophen]; and Celebrex [celecoxib]  Family History  Problem Relation Age of Onset  . Heart disease Father   . Dementia Mother   . Heart disease Brother   . Heart disease Brother   . Diabetes Brother     Social History Social History   Tobacco Use  . Smoking status: Never Smoker  . Smokeless tobacco: Never Used  Substance Use Topics  . Alcohol use: No    Alcohol/week: 0.0 standard drinks  . Drug use: No     Review of Systems  Constitutional: No fever/chills Eyes: No visual changes. No discharge ENT: No upper respiratory complaints. Cardiovascular:  no chest pain. Respiratory: no cough. No SOB. Gastrointestinal: No abdominal pain.  No nausea, no vomiting.  No diarrhea.  No constipation. Musculoskeletal: Patient has neck pain.  Skin: Negative for rash, abrasions, lacerations, ecchymosis. Neurological: Negative for headaches, focal weakness or numbness.   ____________________________________________   PHYSICAL EXAM:  VITAL SIGNS: ED Triage Vitals  Enc Vitals Group     BP 05/27/18 1527 129/72     Pulse Rate 05/27/18 1527 92     Resp 05/27/18 1527 16     Temp 05/27/18 1527 99.3 F (37.4 C)     Temp Source 05/27/18 1527 Oral     SpO2 05/27/18 1527 94 %     Weight 05/27/18 1526 224 lb 13.9 oz (102 kg)     Height 05/27/18 1526 _0  (1.778 m)     Head Circumference --      Peak Flow --      Pain Score 05/27/18 1525 10     Pain Loc --      Pain Edu? --  Excl. in [place]? --      Constitutional: Alert and oriented. Well appearing and in no acute distress. Eyes: Conjunctivae are normal. PERRL. EOMI. Head: Atraumatic. ENT:      Ears: TMs are pearly.       Nose: No congestion/rhinnorhea.      Mouth/Throat: Mucous membranes are moist.  Neck: Patient performs limited range of motion at the neck.  No radiculopathy is elicited with range of motion testing. Cardiovascular: Normal rate, regular rhythm. Normal S1 and S2.  Good peripheral circulation. Respiratory: Normal respiratory effort without tachypnea or retractions. Lungs CTAB. Good air entry to the bases with no decreased or absent breath sounds. Musculoskeletal: Full range of motion to all extremities. No gross deformities appreciated. Neurologic:  Normal speech and language. No gross focal neurologic deficits are appreciated.  Skin:  Skin is warm, dry and intact. No rash noted.  ___________________________________   LABS (all labs ordered are listed, but only abnormal results are displayed)  Labs Reviewed - No data to  display ____________________________________________  EKG   ____________________________________________  RADIOLOGY I personally viewed and evaluated these images as part of my medical decision making, as well as reviewing the written report by the radiologist.  Ct Cervical Spine Wo Contrast  Result Date: 05/27/2018 CLINICAL DATA:  Stiff neck. States initially pain and stiffness started yesterday on right side. This morning pain worsened-- c/o pain bilaterally from behind ears radiating down toward neck. Pain worse with movement. EXAM: CT CERVICAL SPINE WITHOUT CONTRAST TECHNIQUE: Multidetector CT imaging of the cervical spine was performed without intravenous contrast. Multiplanar CT image reconstructions were also generated. COMPARISON:  Plain films 08/28/2017 FINDINGS: Alignment: There is loss of cervical lordosis. This may be secondary to splinting, soft tissue injury, or positioning. Otherwise, alignment is normal. Skull base and vertebrae: No acute fracture. No primary bone lesion or focal pathologic process. Soft tissues and spinal canal: No prevertebral fluid or swelling. No visible canal hematoma. Disc levels: There are degenerative changes in the mid and LOWER cervical spine. Bilateral foraminal narrowing seen at C3-4, C4-5, C5-6. Upper chest: Negative. Other: None IMPRESSION: 1. Mid and LOWER cervical spine degenerative changes. 2. Loss of lordosis. 3. No acute fracture. Electronically Signed   By: Nolon Nations M.D.   On: 05/27/2018 17:48    ____________________________________________    PROCEDURES  Procedure(s) performed:    Procedures    Medications  diazepam (VALIUM) tablet 2 mg (2 mg Oral Given 05/27/18 1710)  ketorolac (TORADOL) 30 MG/ML injection 30 mg (30 mg Intramuscular Given 05/27/18 1710)     ____________________________________________   INITIAL IMPRESSION / ASSESSMENT AND PLAN / ED COURSE  Pertinent labs & imaging results that were available during  my care of the patient were reviewed by me and considered in my medical decision making (see chart for details).  Review of the [place] CSRS was performed in accordance of the [place] prior to dispensing any controlled drugs.    Assessment and plan Torticollis Patient presents to the emergency department with new onset neck pain and stiffness for the past day.  CT cervical spine reveals mild degenerative changes but no other acute abnormalities.  Patient was given Valium in the emergency department and Toradol patient reports that his range of motion improved significantly.  Medications were selected as patient has received these meds in the past without complication.  Patient was discharged with a short course of Valium.  He was advised to follow-up with primary care as needed.  All patient questions were  answered.     ____________________________________________  FINAL CLINICAL IMPRESSION(S) / ED DIAGNOSES  Final diagnoses:  Torticollis      NEW MEDICATIONS STARTED DURING THIS VISIT:  ED Discharge Orders         Ordered    diazepam (VALIUM) 2 MG tablet  Every 12 hours PRN     05/27/18 1811              This chart was dictated using voice recognition software/Dragon. Despite best efforts to proofread, errors can occur which can change the meaning. Any change was purely unintentional.    Lannie Fields, PA-C 05/27/18 1849    Orbie Pyo, MD 05/27/18 [phone]

## 2018-05-27 NOTE — ED Triage Notes (Signed)
C/O stiff neck. States initially pain and stiffness started yesterday on right side.  This morning pain worsened-- c/o pain bilaterally from behind ears radiating down toward neck. Pain worse with movement.  Had similar symptoms last December, seen through ED.

## 2018-05-28 ENCOUNTER — Other Ambulatory Visit: Payer: Self-pay

## 2018-05-28 ENCOUNTER — Emergency Department
Admission: EM | Admit: 2018-05-28 | Discharge: 2018-05-28 | Disposition: A | Discharge status: Home / Self Care | Payer: Medicare Other | Attending: Emergency Medicine | Admitting: Emergency Medicine

## 2018-05-28 DIAGNOSIS — Z79899 Other long term (current) drug therapy: Secondary | ICD-10-CM | POA: Insufficient documentation

## 2018-05-28 DIAGNOSIS — M436 Torticollis: Secondary | ICD-10-CM | POA: Insufficient documentation

## 2018-05-28 DIAGNOSIS — M542 Cervicalgia: Secondary | ICD-10-CM | POA: Diagnosis present

## 2018-05-28 MED ORDER — ORPHENADRINE CITRATE 30 MG/ML IJ SOLN
60.0000 mg | Freq: Once | INTRAMUSCULAR | Status: AC
  Administered 2018-05-28: 60 mg via INTRAMUSCULAR
  Filled 2018-05-28: qty 2

## 2018-05-28 MED ORDER — DICLOFENAC SODIUM 1 % TD GEL: 2.0000 g | Freq: Four times a day (QID) | TRANSDERMAL | 0 refills | Status: DC

## 2018-05-28 NOTE — ED Provider Notes (Signed)
[facility] Emergency Department Provider Note  ____________________________________________  Time seen: Approximately 1:06 PM  I have reviewed the triage vital signs and the nursing notes.   HISTORY  Chief Complaint Neck Pain   HPI Brandon Bright is a 72 y.o. male with history as listed below who presents for evaluation of neck pain.  Patient reports progressively worsening pain in his neck for the last 3 days.  He woke up with pain on the left side and now feels that his entire neck is very stiff.  The pain is sharp, he has very limited range of motion due to the severity of the pain which gets worse with movement.  Patient reports a prior episode in December 2018.  No headache,  Past Medical History:  Diagnosis Date  . AGE (acute gastroenteritis) 08/19/2014  . Anemia    hx of  . ARF (acute renal failure) ([place]) 08/19/2014  . 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 Chronic kidney disease    acute renal failue?  . Colon polyps   . Diverticulitis   . Gallstones   . Hypercholesterolemia   . Hypertension    controlled on meds  . Hypokalemia 01/02/2016  . Peripheral vascular disease (HCC)    varicose veins  . 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 Benign prostatic hyperplasia with weak urinary stream 04/05/2018  . Lumbar radiculitis (Right) (L3 & L4) 07/19/2017  . Lumbar foraminal stenosis (Right: Severe L3-4 & L4-5) 07/19/2017  . Lumbar disc protrusion (Left: L2-3) (Central: L5-S1) 07/19/2017  . Lumbar facet arthropathy (Bilateral) 07/19/2017  . Lumbar spondylosis 08/09/2016  . Chronic midline low back pain with right-sided sciatica (Secondary source of pain) 08/09/2016  . 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 Chronic neck pain [facility] source of pain) (Left) 08/09/2016  . Cholelithiasis 07/29/2016  . Degenerative arthritis of spine 07/29/2016  . Hyperlipidemia, unspecified  07/29/2016  . Hypertension 07/29/2016  . Iron deficiency anemia 07/29/2016  . Right bundle branch block 07/29/2016  . Varicose veins of lower extremity 07/29/2016  . Chronic lower extremity pain (Primary Source of Pain) (Right) 07/29/2016  . Chronic pain syndrome 07/29/2016  . Lumbar facet syndrome 09/02/2015  . Cervical facet syndrome 09/02/2015  . DDD (degenerative disc disease), cervical 05/01/2015  . DDD (degenerative disc disease), lumbar 05/01/2015    Past Surgical History:  Procedure Laterality Date  . CARDIAC CATHETERIZATION  1999?   no issue/ DR Nehemiah Massed  . CATARACT EXTRACTION W/PHACO Left 02/04/2016   Procedure: CATARACT EXTRACTION PHACO AND INTRAOCULAR LENS PLACEMENT (IOC) LEFT EYE;  Surgeon: Leandrew Koyanagi, MD;  Location: [place];  Service: Ophthalmology;  Laterality: Left;  . CATARACT EXTRACTION W/PHACO Right 03/03/2016   Procedure: CATARACT EXTRACTION PHACO AND INTRAOCULAR LENS PLACEMENT (IOC);  Surgeon: Leandrew Koyanagi, MD;  Location: [place];  Service: Ophthalmology;  Laterality: Right;  . COLONOSCOPY    . EYE SURGERY  May and June   bilateral cataract  . ganglion cyst removal Right   . PILONIDAL CYST EXCISION      Prior to Admission medications   Medication Sig Start Date End Date Taking? Authorizing Provider  aspirin 325 MG tablet Take 325 mg by mouth daily. am    [provider]  azithromycin (ZITHROMAX) 250 MG tablet Take 1 tablet (250 mg total) by mouth daily. Take first 2 tablets together, then 1 every day until finished. 06/01/18  Lorin Picket, PA-C  chlorpheniramine (CHLOR-TRIMETON) 4 MG tablet Take 1 tablet (4 mg total) by mouth 2 (two) times daily as needed for allergies. 05/25/18   Lorin Picket, PA-C  diazepam (VALIUM) 2 MG tablet Take 1 tablet (2 mg total) by mouth every 12 (twelve) hours as needed for up to 5 days for anxiety. 05/27/18 06/01/18  Lannie Fields, PA-C  diclofenac sodium (VOLTAREN) 1 % GEL Apply  2 g topically 4 (four) times daily. 05/28/18   Rudene Re, MD  docusate sodium (COLACE) 100 MG capsule Take 100 mg by mouth 2 (two) times daily. Am and dinner    [provider]  fluticasone (FLONASE) 50 MCG/ACT nasal spray Place 2 sprays into both nostrils daily. 05/25/18   Lorin Picket, PA-C  gabapentin (NEURONTIN) 300 MG capsule Limit 2-3 tablets by mouth per day or twice per day if tolerated 03/25/16   Mohammed Kindle, MD  Hypertonic Nasal Wash (SINUS RINSE KIT NA) Place into the nose as needed.    [provider]  latanoprost (XALATAN) 0.005 % ophthalmic solution Place 1 drop into both eyes at bedtime.    [provider]  lisinopril-hydrochlorothiazide (PRINZIDE,ZESTORETIC) 20-25 MG tablet TAKE ONE TABLET BY MOUTH DAILY. 07/19/16   [provider]  metoprolol succinate (TOPROL-XL) 50 MG 24 hr tablet Take 50 mg by mouth daily.  05/20/17 05/25/18  [provider]  Multiple Vitamins-Minerals (ICAPS) CAPS Take 1 capsule by mouth daily.     [provider]  Multiple Vitamins-Minerals (PRESERVISION AREDS PO) Take 1 tablet by mouth 2 (two) times daily. Am and dinner    [provider]  simvastatin (ZOCOR) 20 MG tablet Take 20 mg by mouth daily. bedtime    [provider]    Allergies Codeine; Vicodin [hydrocodone-acetaminophen]; and Celebrex [celecoxib]  Family History  Problem Relation Age of Onset  . Heart disease Father   . Dementia Mother   . Heart disease Brother   . Heart disease Brother   . Diabetes Brother     Social History Social History   Tobacco Use  . Smoking status: Never Smoker  . Smokeless tobacco: Never Used  Substance Use Topics  . Alcohol use: No    Alcohol/week: 0.0 standard drinks  . Drug use: No    Review of Systems  Constitutional: Negative for fever. Eyes: Negative for visual changes. ENT: Negative for sore throat. Neck: + neck pain  Cardiovascular: Negative for chest  pain. Respiratory: Negative for shortness of breath. Gastrointestinal: Negative for abdominal pain, vomiting or diarrhea. Genitourinary: Negative for dysuria. Musculoskeletal: Negative for back pain. Skin: Negative for rash. Neurological: Negative for headaches, weakness or numbness. Psych: No SI or HI  ____________________________________________   PHYSICAL EXAM:  VITAL SIGNS: ED Triage Vitals  Enc Vitals Group     BP 05/28/18 1018 (!) 142/68     Pulse Rate 05/28/18 1018 77     Resp 05/28/18 1018 16     Temp 05/28/18 1018 98.5 F (36.9 C)     Temp Source 05/28/18 1018 Oral     SpO2 05/28/18 1018 94 %     Weight 05/28/18 0956 224 lb 13.9 oz (102 kg)     Height 05/28/18 0956 _0  (1.778 m)     Head Circumference --      Peak Flow --      Pain Score 05/28/18 0955 10     Pain Loc --      Pain Edu? --  Excl. in [place]? --     Constitutional: Alert and oriented. Well appearing and in no apparent distress. HEENT:      Head: Normocephalic and atraumatic.         Eyes: Conjunctivae are normal. Sclera is non-icteric.       Mouth/Throat: Mucous membranes are moist.       Neck: Extremely limited ROM of the neck with bilateral muscle spasms Cardiovascular: Regular rate and rhythm. No murmurs, gallops, or rubs. 2+ symmetrical distal pulses are present in all extremities. No JVD. Respiratory: Normal respiratory effort. Lungs are clear to auscultation bilaterally. No wheezes, crackles, or rhonchi.  Musculoskeletal: Nontender with normal range of motion in all extremities. No edema, cyanosis, or erythema of extremities. Neurologic: Normal speech and language. Face is symmetric. Moving all extremities. No gross focal neurologic deficits are appreciated. Skin: Skin is warm, dry and intact. No rash noted. Psychiatric: Mood and affect are normal. Speech and behavior are normal.  ____________________________________________   LABS (all labs ordered are listed, but only abnormal results  are displayed)  Labs Reviewed - No data to display ____________________________________________  EKG  none  ____________________________________________  RADIOLOGY  none  ____________________________________________   PROCEDURES  Procedure(s) performed: None Procedures Critical Care performed:  None ____________________________________________   INITIAL IMPRESSION / ASSESSMENT AND PLAN / ED COURSE  72 y.o. male with history as listed below who presents for evaluation of neck pain.  Patient was seen here yesterday for the same and had a CT of his neck which was negative.  His presentation is concerning for torticollis.  Will provide patient with an IM injection of Norflex.  Will discharge home on heat pads, range of motion exercises, ibuprofen, topical diclofenac. Patient given Rx for valium yesterday, recommended continue to take it.       As part of my medical decision making, I reviewed the following data within the [place] notes reviewed and incorporated, Old chart reviewed, Notes from prior ED visits and [place] Controlled Substance Database    Pertinent labs & imaging results that were available during my care of the patient were reviewed by me and considered in my medical decision making (see chart for details).    ____________________________________________   FINAL CLINICAL IMPRESSION(S) / ED DIAGNOSES  Final diagnoses:  Torticollis      NEW MEDICATIONS STARTED DURING THIS VISIT:  ED Discharge Orders         Ordered    diclofenac sodium (VOLTAREN) 1 % GEL  4 times daily     05/28/18 1310           Note:  This document was prepared using Dragon voice recognition software and may include unintentional dictation errors.    Alfred Levins, [place], MD 05/28/18 1310

## 2018-05-28 NOTE — ED Triage Notes (Signed)
Pt c/o neck pain that started on Thursday and was seen here for the same sx yesterday. Denies injury. States it started no the left, then was on the right and since yesterday having the pain down both sides of the neck.Marland Kitchen

## 2018-05-28 NOTE — ED Notes (Signed)
Upon assessment this RN didn't noticed any swelling/bruising/redeness.  Family at bedside.

## 2018-05-28 NOTE — ED Notes (Addendum)
Pt states his pain is a 10/10 when he moves his neck. Pt c/o of pain bilaterally and back neck. Pt denies any injuries/ falls. Pt states he had the same issue last year and was prescribed toradol and valium, per pt  "didn't help as much" Pt states yesterday he was sent home with a prescription of toradol, per pt  "didn't help and I was in pain all night"  NAD noted. VSS.

## 2018-05-30 ENCOUNTER — Other Ambulatory Visit: Payer: Self-pay | Admitting: Student

## 2018-05-30 DIAGNOSIS — R1032 Left lower quadrant pain: Principal | ICD-10-CM

## 2018-05-30 DIAGNOSIS — K654 Sclerosing mesenteritis: Secondary | ICD-10-CM

## 2018-05-30 DIAGNOSIS — R1031 Right lower quadrant pain: Secondary | ICD-10-CM

## 2018-06-01 ENCOUNTER — Ambulatory Visit
Admission: RE | Admit: 2018-06-01 | Discharge: 2018-06-01 | Disposition: A | Discharge status: Home / Self Care | Payer: Medicare Other | Source: Ambulatory Visit | Attending: Student | Admitting: Student

## 2018-06-01 DIAGNOSIS — R1032 Left lower quadrant pain: Secondary | ICD-10-CM

## 2018-06-01 DIAGNOSIS — I700 Atherosclerosis of aorta: Secondary | ICD-10-CM | POA: Insufficient documentation

## 2018-06-01 DIAGNOSIS — I2510 Atherosclerotic heart disease of native coronary artery without angina pectoris: Secondary | ICD-10-CM | POA: Insufficient documentation

## 2018-06-01 DIAGNOSIS — K654 Sclerosing mesenteritis: Secondary | ICD-10-CM | POA: Insufficient documentation

## 2018-06-01 DIAGNOSIS — K8020 Calculus of gallbladder without cholecystitis without obstruction: Secondary | ICD-10-CM | POA: Diagnosis not present

## 2018-06-01 DIAGNOSIS — R1031 Right lower quadrant pain: Secondary | ICD-10-CM | POA: Insufficient documentation

## 2018-06-01 MED ORDER — IOHEXOL 300 MG/ML  SOLN
100.0000 mL | Freq: Once | INTRAMUSCULAR | Status: AC | PRN
  Administered 2018-06-01: 100 mL via INTRAVENOUS

## 2018-06-05 ENCOUNTER — Ambulatory Visit: Payer: Medicare Other | Admitting: Anesthesiology

## 2018-06-05 ENCOUNTER — Encounter: Payer: Self-pay | Admitting: *Deleted

## 2018-06-05 ENCOUNTER — Encounter
Admission: RE | Disposition: A | Discharge status: Home / Self Care | Payer: Self-pay | Source: Ambulatory Visit | Attending: Unknown Physician Specialty

## 2018-06-05 ENCOUNTER — Ambulatory Visit
Admission: RE | Admit: 2018-06-05 | Discharge: 2018-06-05 | Disposition: A | Discharge status: Home / Self Care | Payer: Medicare Other | Source: Ambulatory Visit | Attending: Unknown Physician Specialty | Admitting: Unknown Physician Specialty

## 2018-06-05 DIAGNOSIS — K640 First degree hemorrhoids: Secondary | ICD-10-CM | POA: Diagnosis not present

## 2018-06-05 DIAGNOSIS — K529 Noninfective gastroenteritis and colitis, unspecified: Secondary | ICD-10-CM | POA: Diagnosis present

## 2018-06-05 DIAGNOSIS — Z79899 Other long term (current) drug therapy: Secondary | ICD-10-CM | POA: Diagnosis not present

## 2018-06-05 DIAGNOSIS — Z7982 Long term (current) use of aspirin: Secondary | ICD-10-CM | POA: Insufficient documentation

## 2018-06-05 DIAGNOSIS — Z86010 Personal history of colonic polyps: Secondary | ICD-10-CM | POA: Diagnosis not present

## 2018-06-05 DIAGNOSIS — E7800 Pure hypercholesterolemia, unspecified: Secondary | ICD-10-CM | POA: Diagnosis not present

## 2018-06-05 DIAGNOSIS — I739 Peripheral vascular disease, unspecified: Secondary | ICD-10-CM | POA: Diagnosis not present

## 2018-06-05 DIAGNOSIS — M1990 Unspecified osteoarthritis, unspecified site: Secondary | ICD-10-CM | POA: Insufficient documentation

## 2018-06-05 DIAGNOSIS — N189 Chronic kidney disease, unspecified: Secondary | ICD-10-CM | POA: Diagnosis not present

## 2018-06-05 DIAGNOSIS — K52832 Lymphocytic colitis: Secondary | ICD-10-CM | POA: Diagnosis not present

## 2018-06-05 DIAGNOSIS — I129 Hypertensive chronic kidney disease with stage 1 through stage 4 chronic kidney disease, or unspecified chronic kidney disease: Secondary | ICD-10-CM | POA: Diagnosis not present

## 2018-06-05 HISTORY — PX: COLONOSCOPY WITH PROPOFOL: SHX5780

## 2018-06-05 SURGERY — COLONOSCOPY WITH PROPOFOL
Anesthesia: General

## 2018-06-05 MED ORDER — PROPOFOL 500 MG/50ML IV EMUL
INTRAVENOUS | Status: DC | PRN
  Administered 2018-06-05: 130 ug/kg/min via INTRAVENOUS

## 2018-06-05 MED ORDER — SODIUM CHLORIDE 0.9 % IV SOLN: INTRAVENOUS | Status: DC

## 2018-06-05 MED ORDER — PROPOFOL 500 MG/50ML IV EMUL
INTRAVENOUS | Status: AC
  Filled 2018-06-05: qty 50

## 2018-06-05 MED ORDER — PROPOFOL 10 MG/ML IV BOLUS
INTRAVENOUS | Status: DC | PRN
  Administered 2018-06-05: 80 mg via INTRAVENOUS
  Administered 2018-06-05: 20 mg via INTRAVENOUS

## 2018-06-05 MED ORDER — SODIUM CHLORIDE 0.9 % IV SOLN
INTRAVENOUS | Status: DC
  Administered 2018-06-05: 15:00:00 via INTRAVENOUS

## 2018-06-05 NOTE — Anesthesia Procedure Notes (Signed)
Performed by: Mikaelah Trostle, CRNA Pre-anesthesia Checklist: Patient identified, Emergency Drugs available, Suction available, Patient being monitored and Timeout performed Patient Re-evaluated:Patient Re-evaluated prior to induction Oxygen Delivery Method: Nasal cannula Induction Type: IV induction       

## 2018-06-05 NOTE — Transfer of Care (Signed)
Immediate Anesthesia Transfer of Care Note  Patient: Brandon Bright  Procedure(s) Performed: COLONOSCOPY WITH PROPOFOL (N/A )  Patient Location: PACU  Anesthesia Type:General  Level of Consciousness: sedated and responds to stimulation  Airway & Oxygen Therapy: Patient Spontanous Breathing and Patient connected to nasal cannula oxygen  Post-op Assessment: Report given to RN and Post -op Vital signs reviewed and stable  Post vital signs: Reviewed and stable  Last Vitals:  Vitals Value Taken Time  BP 97/63 06/05/2018  5:07 PM  Temp    Pulse 91 06/05/2018  5:08 PM  Resp 7 06/05/2018  5:07 PM  SpO2 96 % 06/05/2018  5:08 PM  Vitals shown include unvalidated device data.  Last Pain:  Vitals:   06/05/18 1707  TempSrc: (P) Tympanic  PainSc:          Complications: No apparent anesthesia complications

## 2018-06-05 NOTE — Anesthesia Post-op Follow-up Note (Signed)
Anesthesia QCDR form completed.        

## 2018-06-05 NOTE — H&P (Signed)
Primary Care Physician:  Donnamarie Rossetti, PA-C Primary Gastroenterologist:  Dr. Vira Agar  Pre-Procedure History & Physical: HPI:  Brandon Bright is a 72 y.o. male is here for an colonoscopy.  This is done for history of colon polyps   Past Medical History:  Diagnosis Date  . AGE (acute gastroenteritis) 08/19/2014  . Anemia    hx of  . ARF (acute renal failure) ([place]) 08/19/2014  . 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 Chronic kidney disease    acute renal failue?  . Colon polyps   . Diverticulitis   . Gallstones   . Hypercholesterolemia   . Hypertension    controlled on meds  . Hypokalemia 01/02/2016  . Peripheral vascular disease ([facility]CC)    varicose veins  . 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 CARDIAC CATHETERIZATION  1999?   no issue/ DR Nehemiah Massed  . CATARACT EXTRACTION W/PHACO Left 02/04/2016   Procedure: CATARACT EXTRACTION PHACO AND INTRAOCULAR LENS PLACEMENT (IOC) LEFT EYE;  Surgeon: Leandrew Koyanagi, MD;  Location: [facility];  Service: Ophthalmology;  Laterality: Left;  . CATARACT EXTRACTION W/PHACO Right 03/03/2016   Procedure: CATARACT EXTRACTION PHACO AND INTRAOCULAR LENS PLACEMENT (IOC);  Surgeon: Leandrew Koyanagi, MD;  Location: [facility];  Service: Ophthalmology;  Laterality: Right;  . COLONOSCOPY    . EYE SURGERY  May and June   bilateral cataract  . ganglion cyst removal Right   . PILONIDAL CYST EXCISION      Prior to Admission medications   Medication Sig Start Date End Date Taking? Authorizing Provider  aspirin 325 MG tablet Take 325 mg by mouth daily. am   Yes [provider]  chlorpheniramine (CHLOR-TRIMETON) 4 MG tablet Take 1 tablet (4 mg total) by mouth 2 (two) times daily as needed for allergies. 05/25/18  Yes Lorin Picket, PA-C  diclofenac sodium (VOLTAREN) 1 % GEL Apply 2 g topically 4 (four) times daily. 05/28/18  Yes [place], [place], MD   docusate sodium (COLACE) 100 MG capsule Take 100 mg by mouth 2 (two) times daily. Am and dinner   Yes [provider]  fluticasone (FLONASE) 50 MCG/ACT nasal spray Place 2 sprays into both nostrils daily. 05/25/18  Yes Lorin Picket, PA-C  gabapentin (NEURONTIN) 300 MG capsule Limit 2-3 tablets by mouth per day or twice per day if tolerated 03/25/16  Yes Mohammed Kindle, MD  Hypertonic Nasal Wash (SINUS RINSE KIT NA) Place into the nose as needed.   Yes [provider]  latanoprost (XALATAN) 0.005 % ophthalmic solution Place 1 drop into both eyes at bedtime.   Yes [provider]  lisinopril-hydrochlorothiazide (PRINZIDE,ZESTORETIC) 20-25 MG tablet TAKE ONE TABLET BY MOUTH DAILY. 07/19/16  Yes [provider]  Multiple Vitamins-Minerals (ICAPS) CAPS Take 1 capsule by mouth daily.    Yes [provider]  Multiple Vitamins-Minerals (PRESERVISION AREDS PO) Take 1 tablet by mouth 2 (two) times daily. Am and dinner   Yes [provider]  simvastatin (ZOCOR) 20 MG tablet Take 20 mg by mouth daily. bedtime   Yes [provider]  azithromycin (ZITHROMAX) 250 MG tablet Take 1 tablet (250 mg total) by mouth daily. Take first 2 tablets together, then 1 every day until finished. Patient not taking: Reported on 06/05/2018 06/01/18   Lorin Picket, PA-C  metoprolol succinate (TOPROL-XL) 50 MG 24 hr tablet Take 50 mg by mouth daily.  05/20/17 05/25/18  [provider]    Allergies as of 03/24/2018 - Review Complete 10/24/2017  Allergen Reaction Noted  . Codeine Swelling and Rash 05/01/2015  . Vicodin [hydrocodone-acetaminophen] Swelling 05/01/2015  . Celebrex [celecoxib]  07/29/2016    Family History  Problem Relation Age of Onset  . Heart disease Father   . Dementia Mother   . Heart disease Brother   . Heart disease Brother   . Diabetes Brother     Social History   Socioeconomic History  . Marital status: Married    Spouse  name: Not on file  . Number of children: Not on file  . Years of education: Not on file  . Highest education level: Not on file  Occupational History  . Not on file  Social Needs  . Financial resource strain: Not on file  . Food insecurity:    Worry: Not on file    Inability: Not on file  . Transportation needs:    Medical: Not on file    Non-medical: Not on file  Tobacco Use  . Smoking status: Never Smoker  . Smokeless tobacco: Never Used  Substance and Sexual Activity  . Alcohol use: No    Alcohol/week: 0.0 standard drinks  . Drug use: No  . Sexual activity: Yes    Birth control/protection: None  Lifestyle  . Physical activity:    Days per week: Not on file    Minutes per session: Not on file  . Stress: Not on file  Relationships  . Social connections:    Talks on phone: Not on file    Gets together: Not on file    Attends religious service: Not on file    Active member of club or organization: Not on file    Attends meetings of clubs or organizations: Not on file    Relationship status: Not on file  . Intimate partner violence:    Fear of current or ex partner: Not on file    Emotionally abused: Not on file    Physically abused: Not on file    Forced sexual activity: Not on file  Other Topics Concern  . Not on file  Social History Narrative  . Not on file    Review of Systems: See HPI, otherwise negative ROS  Physical Exam: BP 116/78   Pulse 99   Temp (!) 96.9 F (36.1 C) (Tympanic)   Resp 17   Ht '5\' 10"'  (1.778 m)   Wt 99.8 kg   SpO2 95%   BMI 31.57 kg/m  General:   Alert,  pleasant and cooperative in NAD Head:  Normocephalic and atraumatic. Neck:  Supple; no masses or thyromegaly. Lungs:  Clear throughout to auscultation.    Heart:  Regular rate and rhythm. Abdomen:  Soft, nontender and nondistended. Normal bowel sounds, without guarding, and without rebound.   Neurologic:  Alert and  oriented x4;  grossly normal  neurologically.  Impression/Plan: Brandon Bright is here for an colonoscopy to be performed for [facility] colon polyps  Risks, benefits, limitations, and alternatives regarding  colonoscopy have been reviewed with the patient.  Questions have been answered.  All parties agreeable.   Gaylyn Cheers, MD  06/05/2018, 4:38 PM

## 2018-06-05 NOTE — Anesthesia Postprocedure Evaluation (Signed)
Anesthesia Post Note  Patient: Brandon Bright  Procedure(s) Performed: COLONOSCOPY WITH PROPOFOL (N/A )  Patient location during evaluation: Endoscopy Anesthesia Type: General Level of consciousness: awake and alert Pain management: pain level controlled Vital Signs Assessment: post-procedure vital signs reviewed and stable Respiratory status: spontaneous breathing, nonlabored ventilation, respiratory function stable and patient connected to nasal cannula oxygen Cardiovascular status: blood pressure returned to baseline and stable Postop Assessment: no apparent nausea or vomiting Anesthetic complications: no     Last Vitals:  Vitals:   06/05/18 1707 06/05/18 1717  BP: 97/63 106/70  Pulse: 91   Resp: (!) 7   Temp: (!) 36.1 C   SpO2: 96%     Last Pain:  Vitals:   06/05/18 1717  TempSrc: Tympanic  PainSc: 0-No pain                 Cleda Mccreedy Ahmaya Ostermiller

## 2018-06-05 NOTE — Op Note (Signed)
[facility] Gastroenterology Patient Name: Brandon Bright Procedure Date: 06/05/2018 4:32 PM MRN: [id number] Account #: [id number] Date of Birth: 03-09-1946 Admit Type: Outpatient Age: 72 Room: [facility] ENDO ROOM 1 Gender: Male Note Status: Finalized Procedure:            Colonoscopy Indications:          Clinically significant diarrhea of unexplained origin Providers:            Scot Jun, MD Referring MD:         No Local Md, MD (Referring MD) Medicines:            Propofol per Anesthesia Complications:        No immediate complications. Procedure:            Pre-Anesthesia Assessment:                       - After reviewing the risks and benefits, the patient                        was deemed in satisfactory condition to undergo the                        procedure.                       After obtaining informed consent, the colonoscope was                        passed under direct vision. Throughout the procedure,                        the patient's blood pressure, pulse, and oxygen                        saturations were monitored continuously. The                        Colonoscope was introduced through the anus and                        advanced to the the cecum, identified by appendiceal                        orifice and ileocecal valve. The colonoscopy was                        performed without difficulty. The patient tolerated the                        procedure well. The quality of the bowel preparation                        was good. Findings:      Internal hemorrhoids were found during endoscopy. The hemorrhoids were       small and Grade I (internal hemorrhoids that do not prolapse).      The entire examined colon appeared normal. Due to diarrhea I biopsied       the colon in ascending, transverse, descending and sigmoid colon.      No polyps or other abnormality seen. Impression:           -  Internal hemorrhoids.                       -  The entire examined colon is normal.                       - No specimens collected. Recommendation:       - The findings and recommendations were discussed with                        the patient's family. Scot Jun, MD 06/05/2018 5:05:35 PM This report has been signed electronically. Number of Addenda: 0 Note Initiated On: 06/05/2018 4:32 PM Scope Withdrawal Time: 0 hours 9 minutes 27 seconds  Total Procedure Duration: 0 hours 15 minutes 27 seconds       [facility]

## 2018-06-05 NOTE — Anesthesia Preprocedure Evaluation (Signed)
Anesthesia Evaluation  Patient identified by MRN, date of birth, ID band Patient awake    Reviewed: Allergy & Precautions, H&P , NPO status , reviewed documented beta blocker date and time   Airway Mallampati: II  TM Distance: >3 FB Neck ROM: full    Dental  (+) Caps, Chipped   Pulmonary    Pulmonary exam normal        Cardiovascular hypertension, + Peripheral Vascular Disease  Normal cardiovascular exam+ dysrhythmias      Neuro/Psych  Neuromuscular disease    GI/Hepatic   Endo/Other    Renal/GU Renal disease     Musculoskeletal  (+) Arthritis ,   Abdominal   Peds  Hematology  (+) anemia ,   Anesthesia Other Findings Past Medical History: 08/19/2014: AGE (acute gastroenteritis) No date: Anemia     Comment:  hx of 08/19/2014: ARF (acute renal failure) (HCC) No date: Arthritis     Comment:  osteo in head and neck No date: Chronic kidney disease     Comment:  acute renal failue? 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)  Past Surgical History: 1999?: CARDIAC CATHETERIZATION     Comment:  no issue/ DR Gwen Pounds 02/04/2016: CATARACT EXTRACTION W/PHACO; Left     Comment:  Procedure: CATARACT EXTRACTION PHACO AND INTRAOCULAR               LENS PLACEMENT (IOC) LEFT EYE;  Surgeon: Lockie Mola, MD;  Location: [facility];  Service:              Ophthalmology;  Laterality: Left; 03/03/2016: CATARACT EXTRACTION W/PHACO; Right     Comment:  Procedure: CATARACT EXTRACTION PHACO AND INTRAOCULAR               LENS PLACEMENT (IOC);  Surgeon: Lockie Mola, MD;               Location: [facility];  Service: Ophthalmology;     Laterality: Right; No date: COLONOSCOPY May and June: EYE SURGERY     Comment:  bilateral cataract No date: ganglion cyst removal; Right No date: PILONIDAL CYST EXCISION  BMI    Body Mass Index:  31.57 kg/m      Reproductive/Obstetrics                             Anesthesia Physical Anesthesia Plan  ASA: III  Anesthesia Plan: General   Post-op Pain Management:    Induction: Intravenous  PONV Risk Score and Plan: Treatment may vary due to age or medical condition and TIVA  Airway Management Planned: Nasal Cannula and Natural Airway  Additional Equipment:   Intra-op Plan:   Post-operative Plan:   Informed Consent: I have reviewed the patients History and Physical, chart, labs and discussed the procedure including the risks, benefits and alternatives for the proposed anesthesia with the patient or authorized representative who has indicated his/her understanding and acceptance.   Dental Advisory Given  Plan Discussed with: CRNA  Anesthesia Plan Comments:         Anesthesia Quick Evaluation

## 2018-06-06 ENCOUNTER — Encounter: Payer: Self-pay | Admitting: Unknown Physician Specialty

## 2018-06-08 LAB — SURGICAL PATHOLOGY

## 2018-07-04 DIAGNOSIS — Z0000 Encounter for general adult medical examination without abnormal findings: Secondary | ICD-10-CM | POA: Insufficient documentation

## 2018-07-04 DIAGNOSIS — K52832 Lymphocytic colitis: Secondary | ICD-10-CM | POA: Insufficient documentation

## 2018-07-12 ENCOUNTER — Encounter: Payer: Self-pay | Admitting: Nurse Practitioner

## 2018-07-12 ENCOUNTER — Ambulatory Visit: Payer: Medicare Other | Attending: Nurse Practitioner | Admitting: Nurse Practitioner

## 2018-07-12 VITALS — BP 132/61 | HR 65 | Temp 98.1°F | Resp 16 | Ht 70.0 in | Wt 220.0 lb

## 2018-07-12 DIAGNOSIS — M5116 Intervertebral disc disorders with radiculopathy, lumbar region: Secondary | ICD-10-CM | POA: Insufficient documentation

## 2018-07-12 DIAGNOSIS — M5136 Other intervertebral disc degeneration, lumbar region: Secondary | ICD-10-CM | POA: Insufficient documentation

## 2018-07-12 DIAGNOSIS — M47896 Other spondylosis, lumbar region: Secondary | ICD-10-CM | POA: Diagnosis not present

## 2018-07-12 DIAGNOSIS — K8020 Calculus of gallbladder without cholecystitis without obstruction: Secondary | ICD-10-CM | POA: Diagnosis not present

## 2018-07-12 DIAGNOSIS — Z885 Allergy status to narcotic agent status: Secondary | ICD-10-CM | POA: Diagnosis not present

## 2018-07-12 DIAGNOSIS — I4510 Unspecified right bundle-branch block: Secondary | ICD-10-CM | POA: Insufficient documentation

## 2018-07-12 DIAGNOSIS — M5030 Other cervical disc degeneration, unspecified cervical region: Secondary | ICD-10-CM | POA: Insufficient documentation

## 2018-07-12 DIAGNOSIS — M779 Enthesopathy, unspecified: Secondary | ICD-10-CM | POA: Diagnosis not present

## 2018-07-12 DIAGNOSIS — Z7982 Long term (current) use of aspirin: Secondary | ICD-10-CM | POA: Diagnosis not present

## 2018-07-12 DIAGNOSIS — Z886 Allergy status to analgesic agent status: Secondary | ICD-10-CM | POA: Diagnosis not present

## 2018-07-12 DIAGNOSIS — K52832 Lymphocytic colitis: Secondary | ICD-10-CM | POA: Insufficient documentation

## 2018-07-12 DIAGNOSIS — Z79899 Other long term (current) drug therapy: Secondary | ICD-10-CM | POA: Insufficient documentation

## 2018-07-12 DIAGNOSIS — N189 Chronic kidney disease, unspecified: Secondary | ICD-10-CM | POA: Insufficient documentation

## 2018-07-12 DIAGNOSIS — M542 Cervicalgia: Secondary | ICD-10-CM

## 2018-07-12 DIAGNOSIS — G894 Chronic pain syndrome: Secondary | ICD-10-CM | POA: Diagnosis not present

## 2018-07-12 DIAGNOSIS — M79604 Pain in right leg: Secondary | ICD-10-CM | POA: Diagnosis not present

## 2018-07-12 DIAGNOSIS — N400 Enlarged prostate without lower urinary tract symptoms: Secondary | ICD-10-CM | POA: Insufficient documentation

## 2018-07-12 DIAGNOSIS — M48061 Spinal stenosis, lumbar region without neurogenic claudication: Secondary | ICD-10-CM | POA: Diagnosis not present

## 2018-07-12 DIAGNOSIS — D509 Iron deficiency anemia, unspecified: Secondary | ICD-10-CM | POA: Insufficient documentation

## 2018-07-12 DIAGNOSIS — M1288 Other specific arthropathies, not elsewhere classified, other specified site: Secondary | ICD-10-CM | POA: Diagnosis not present

## 2018-07-12 DIAGNOSIS — I129 Hypertensive chronic kidney disease with stage 1 through stage 4 chronic kidney disease, or unspecified chronic kidney disease: Secondary | ICD-10-CM | POA: Diagnosis not present

## 2018-07-12 DIAGNOSIS — M47816 Spondylosis without myelopathy or radiculopathy, lumbar region: Secondary | ICD-10-CM | POA: Diagnosis not present

## 2018-07-12 DIAGNOSIS — G8929 Other chronic pain: Secondary | ICD-10-CM

## 2018-07-12 DIAGNOSIS — M51369 Other intervertebral disc degeneration, lumbar region without mention of lumbar back pain or lower extremity pain: Secondary | ICD-10-CM

## 2018-07-12 DIAGNOSIS — Z5181 Encounter for therapeutic drug level monitoring: Secondary | ICD-10-CM | POA: Insufficient documentation

## 2018-07-12 DIAGNOSIS — E785 Hyperlipidemia, unspecified: Secondary | ICD-10-CM | POA: Diagnosis not present

## 2018-07-12 DIAGNOSIS — Z8249 Family history of ischemic heart disease and other diseases of the circulatory system: Secondary | ICD-10-CM | POA: Diagnosis not present

## 2018-07-12 NOTE — Progress Notes (Signed)
Safety precautions to be maintained throughout the outpatient stay will include: orient to surroundings, keep bed in low position, maintain call bell within reach at all times, provide assistance with transfer out of bed and ambulation.  

## 2018-07-12 NOTE — Progress Notes (Addendum)
Patient's Name: Brandon Bright  MRN: [id number]  Referring Provider: Leonel Ramsay, MD  DOB: Sep 30, 1945  PCP: Donnamarie Rossetti, PA-C  DOS: 07/12/2018  Note by: Vevelyn Francois NP  Service setting: Ambulatory outpatient  Specialty: Interventional Pain Management  Location: [facility] (AMB) Pain Management Facility    Patient type: Established    Primary Reason(s) for Visit: Encounter for prescription drug management. (Level of risk: moderate)  CC: Back Pain (nerve pain) and Leg Pain (nerve pain)  HPI  Brandon Bright is a 72 y.o. year old, male patient, who comes today for a medication management evaluation. He has DDD (degenerative disc disease), cervical; DDD (degenerative disc disease), lumbar; Lumbar facet syndrome; Cervical facet syndrome; Cholelithiasis; Degenerative arthritis of spine; Hyperlipidemia; Hypertension; Iron deficiency anemia; Right bundle branch block; Tendinitis of wrist; Varicose veins of lower extremity; Chronic lower extremity pain (Primary Source of Pain) (Right); Chronic pain syndrome; Lumbar spondylosis; Chronic midline low back pain with right-sided sciatica (Secondary source of pain); Lumbar discogenic pain syndrome; Chronic neck pain (Tertiary source of pain) (Left); Lumbar radiculitis (Right) (L3 & L4); 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 His primarily concern today is the Back Pain (nerve pain) and Leg Pain (nerve pain)  Pain Assessment: Location: Lower, Left, Right Back Radiating: when having the pain it will radiate into right leg  Onset: More than a month ago Duration: Chronic pain Quality: Discomfort Severity: 0-No pain/10 (subjective, self-reported pain score)  Note: Reported level is compatible with  observation.                          Effect on ADL: no affect currently  Timing: Occasional Modifying factors: gabapentin BP: 132/61  HR: 65  Brandon Bright was last scheduled for an appointment on Visit date not found for medication management. During today's appointment we reviewed Brandon Bright's chronic pain status, as well as his outpatient medication regimen. He admits that he had a flare up with his neck. He denied any injury. He denied any numbness or tingling.  He states that he was given an injection and oral medication. He uses occasional Diclofenac which is was effective.   The patient  reports that he does not use drugs. His body mass index is 31.57 kg/m.  Further details on both, my assessment(s), as well as the proposed treatment plan, please see below.  Laboratory Chemistry  Inflammation Markers (CRP: Acute Phase) (ESR: Chronic Phase) No results found for: CRP, ESRSEDRATE, LATICACIDVEN                       Rheumatology Markers No results found for: RF, ANA, LABURIC, URICUR, LYMEIGGIGMAB, LYMEABIGMQN, HLAB27                      Renal Function Markers Lab Results  Component Value Date   BUN 16 08/17/2014   CREATININE 1.16 08/17/2014   GFRAA >60 08/17/2014   GFRNONAA >60 08/17/2014                             Hepatic Function Markers Lab Results  Component Value Date   AST 15 08/17/2014   ALT 20 08/17/2014   ALBUMIN 2.7 (L) 08/17/2014  ALKPHOS 43 (L) 08/17/2014   LIPASE 75 08/16/2014                        Electrolytes Lab Results  Component Value Date   NA 144 08/17/2014   K 3.3 (L) 08/17/2014   CL 111 (H) 08/17/2014   CALCIUM 7.5 (L) 08/17/2014   MG 1.7 (L) 08/16/2014                        Neuropathy Markers No results found for: VITAMINB12, FOLATE, HGBA1C, HIV                      CNS Tests No results found for: COLORCSF, APPEARCSF, RBCCOUNTCSF, WBCCSF, POLYSCSF, LYMPHSCSF, EOSCSF, PROTEINCSF, GLUCCSF, JCVIRUS, CSFOLI, IGGCSF                       Bone Pathology Markers No results found for: VD25OH, XB147WG9FAO, ZH0865HQ4, ON6295MW4, 25OHVITD1, 25OHVITD2, 25OHVITD3, TESTOFREE, TESTOSTERONE                       Coagulation Parameters Lab Results  Component Value Date   PLT 163 08/17/2014                        Cardiovascular Markers Lab Results  Component Value Date   TROPONINI < 0.02 08/16/2014   HGB 13.0 08/17/2014   HCT 38.9 (L) 08/17/2014                         CA Markers No results found for: CEA, CA125, LABCA2                      Note: Lab results reviewed.  Recent Diagnostic Imaging Results  CT ABDOMEN PELVIS W CONTRAST CLINICAL DATA:  Bilateral pelvic pain prior to bowel movements for several weeks.  EXAM: CT ABDOMEN AND PELVIS WITH CONTRAST  TECHNIQUE: Multidetector CT imaging of the abdomen and pelvis was performed using the standard protocol following bolus administration of intravenous contrast.  CONTRAST:  100 mL OMNIPAQUE IOHEXOL 300 MG/ML  SOLN  COMPARISON:  CT abdomen and pelvis 10/27/2015 and 08/16/2014.  FINDINGS: Lower chest: Calcific aortic and coronary atherosclerosis is seen. Lung bases are clear. No pleural or pericardial effusion.  Hepatobiliary: As on the prior studies, several stones measuring up to approximately 1 cm in diameter seen in the gallbladder. No evidence of cholecystitis. The liver and biliary tree appear normal.  Pancreas: Unremarkable. No pancreatic ductal dilatation or surrounding inflammatory changes.  Spleen: Normal in size without focal abnormality.  Adrenals/Urinary Tract: The adrenal glands appear normal. Parapelvic left renal cysts are seen. 2 cm in diameter cyst lower pole right kidney also noted. The kidneys are otherwise unremarkable. Ureters and urinary bladder appear normal.  Stomach/Bowel: Stomach is within normal limits. Appendix appears normal. No evidence of bowel wall thickening, distention, or inflammatory changes.  Vascular/Lymphatic:  Aortic atherosclerosis. No enlarged abdominal or pelvic lymph nodes.  Reproductive: Prostate is unremarkable.  Other: Very mild haziness mesenteric fat is unchanged since the 2015 exam. No fluid collection is identified.  Musculoskeletal: Convex left lumbar scoliosis is seen. Scattered degenerative disc disease appears worst at L2-3 and L5-S1. No lytic or sclerotic lesion.  IMPRESSION: No acute abnormality or finding to explain the patient's symptoms.  Gallstones without cholecystitis.  Calcific aortic and coronary  atherosclerosis.  Very mild haziness of mesenteric fat is chronic and likely incidental.  Electronically Signed   By: Inge Rise M.D.   On: 06/01/2018 14:03  Complexity Note: Imaging results reviewed. Results shared with Brandon Bright, using Layman's terms.                         Meds   Current Outpatient Medications:  .  aspirin 325 MG tablet, Take 325 mg by mouth daily. am, Disp: , Rfl:  .  diclofenac sodium (VOLTAREN) 1 % GEL, Apply 2 g topically 4 (four) times daily., Disp: 1 Tube, Rfl: 0 .  docusate sodium (COLACE) 100 MG capsule, Take 100 mg by mouth 2 (two) times daily. Am and dinner, Disp: , Rfl:  .  fluticasone (FLONASE) 50 MCG/ACT nasal spray, Place 2 sprays into both nostrils daily., Disp: 16 g, Rfl: 0 .  gabapentin (NEURONTIN) 300 MG capsule, Limit 2-3 tablets by mouth per day or twice per day if tolerated, Disp: 540 capsule, Rfl: 0 .  Hypertonic Nasal Wash (SINUS RINSE KIT NA), Place into the nose as needed., Disp: , Rfl:  .  latanoprost (XALATAN) 0.005 % ophthalmic solution, Place 1 drop into both eyes at bedtime., Disp: , Rfl:  .  lisinopril-hydrochlorothiazide (PRINZIDE,ZESTORETIC) 20-25 MG tablet, TAKE ONE TABLET BY MOUTH DAILY., Disp: , Rfl:  .  Multiple Vitamins-Minerals (ICAPS) CAPS, Take 1 capsule by mouth daily. , Disp: , Rfl:  .  Multiple Vitamins-Minerals (PRESERVISION AREDS PO), Take 1 tablet by mouth 2 (two) times daily. Am and  dinner, Disp: , Rfl:  .  simvastatin (ZOCOR) 20 MG tablet, Take 20 mg by mouth daily. bedtime, Disp: , Rfl:  .  metoprolol succinate (TOPROL-XL) 50 MG 24 hr tablet, Take 50 mg by mouth daily. , Disp: , Rfl:   ROS  Constitutional: Denies any fever or chills Gastrointestinal: No reported hemesis, hematochezia, vomiting, or acute GI distress Musculoskeletal: Denies any acute onset joint swelling, redness, loss of ROM, or weakness Neurological: No reported episodes of acute onset apraxia, aphasia, dysarthria, agnosia, amnesia, paralysis, loss of coordination, or loss of consciousness  Allergies  Brandon Bright is allergic to codeine; vicodin [hydrocodone-acetaminophen]; and celebrex [celecoxib].  PFSH  Drug: Brandon Bright  reports that he does not use drugs. Alcohol:  reports that he does not drink alcohol. Tobacco:  reports that he has never smoked. He has never used smokeless tobacco. 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 Surgical: Brandon Bright  has a past surgical history that includes ganglion cyst removal (Right); Cataract extraction w/PHACO (Left, 02/04/2016); Colonoscopy; Pilonidal cyst excision; Cardiac catheterization (1999?); Cataract extraction w/PHACO (Right, 03/03/2016); Eye surgery (May and June); and Colonoscopy with propofol (N/A, 06/05/2018). Family: family history includes Dementia in his mother; Diabetes in his brother; Heart disease in his brother, brother, and father.  Constitutional Exam  General appearance: Well nourished, well developed, and well hydrated. In no apparent acute distress Vitals:   07/12/18 0904  BP: 132/61  Pulse: 65  Resp: 16  Temp: 98.1 F (36.7 C)   TempSrc: Oral  SpO2: 95%  Weight: 220 lb (99.8 kg)  Height: _0  (1.778 m)   BMI Assessment: Estimated body mass index is 31.57 kg/m as calculated from the following:   Height as of  this encounter: _0  (1.778 m).   Weight as of this encounter: 220 lb (99.8 kg). Psych/Mental status: Alert, oriented x 3 (person, place, & time)       Eyes: PERLA Respiratory: No evidence of acute respiratory distress   Cervical Spine Exam  Inspection: No masses, redness, or swelling Alignment: Symmetrical Functional ROM: Adequate ROM      Stability: No instability detected Muscle strength & Tone: Functionally intact Sensory: Unimpaired Palpation: Complains of area being tender to palpation              Lumbar Spine Area Exam  Skin & Axial Inspection: No masses, redness, or swelling Alignment: Symmetrical Functional ROM: Unrestricted ROM       Stability: No instability detected Muscle Tone/Strength: Functionally intact. No obvious neuro-muscular anomalies detected. Sensory (Neurological): Unimpaired Palpation: Non-tender        Gait & Posture Assessment  Ambulation: Unassisted Gait: Relatively normal for age and body habitus Posture: WNL   Lower Extremity Exam    Side: Right lower extremity  Side: Left lower extremity  Stability: No instability observed          Stability: No instability observed          Skin & Extremity Inspection: Skin color, temperature, and hair growth are WNL. No peripheral edema or cyanosis. No masses, redness, swelling, asymmetry, or associated skin lesions. No contractures.  Skin & Extremity Inspection: Skin color, temperature, and hair growth are WNL. No peripheral edema or cyanosis. No masses, redness, swelling, asymmetry, or associated skin lesions. No contractures.  Functional ROM: Unrestricted ROM                  Functional ROM: Unrestricted ROM                  Muscle Tone/Strength: Functionally intact. No obvious neuro-muscular anomalies detected.  Muscle  Tone/Strength: Functionally intact. No obvious neuro-muscular anomalies detected.  Sensory (Neurological): Unimpaired  Sensory (Neurological): Unimpaired  Palpation: No palpable anomalies  Palpation: No palpable anomalies   Assessment  Primary Diagnosis & Pertinent Problem List: The primary encounter diagnosis was Lumbar spondylosis. 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  Status Diagnosis  Controlled Controlled Controlled 1. Lumbar spondylosis   2. 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 Chronic neck pain [facility] source of pain) (Left)   4. DDD (degenerative disc disease), lumbar   5. Chronic pain syndrome     Problems updated and reviewed during this visit: Problem  Lymphocytic Colitis  Medicare Annual Wellness Visit, Subsequent  Hyperlipidemia  Tendinitis of Wrist   Plan of Care  Pharmacotherapy (Medications Ordered): No orders of the defined types were placed in this encounter.  New Prescriptions   No medications on file   Medications administered today: Brandon Bright. Bright had no medications administered during this visit. Lab-work, procedure(s), and/or referral(s): No orders of the defined types were placed in this encounter.  Imaging and/or referral(s): None  Interventional therapies: Planned, scheduled, and/or pending:   Not at this time.   Considering:   Palliative right-sided lumbar epidural steroid injection under fluoroscopic guidance  Diagnostic Trigger point injection Diagnostic Cervical facet nerve block   Palliative PRN treatment(s):   Palliative right-sided lumbar epidural steroid injection under fluoroscopic guidance    Provider-requested follow-up: Return in about 1 year (around 07/13/2019).  Future Appointments  Date Time Provider [place]  03/19/2019 10:15 AM Abbie Sons, MD [facility] None  07/03/2019  9:00 AM Vevelyn Francois, NP [facility] None   Primary Care Physician: Donnamarie Rossetti, PA-C Location: [facility] Out[facility] Management Facility Note by: Vevelyn Francois NP Date: 07/12/2018; Time: 1:07 PM  Pain Score Disclaimer: We use the NRS-11 scale. This is a self-reported, subjective measurement of pain severity with only modest accuracy. It is used primarily to identify changes within a particular patient. It must be understood that outpatient pain scales are significantly less accurate that those used for research, where they can be applied under ideal controlled circumstances with minimal exposure to variables. In reality, the score is likely to be a combination of pain intensity and pain affect, where pain affect describes the degree of emotional arousal or changes in action readiness caused by the sensory experience of pain. Factors such as social and work situation, setting, emotional state, anxiety levels, expectation, and prior pain experience may influence pain perception and show large inter-individual differences that may also be affected by time variables.  Patient instructions provided during this appointment: There are no Patient Instructions on file for this visit.

## 2018-07-21 ENCOUNTER — Other Ambulatory Visit: Payer: Self-pay | Admitting: Family Medicine

## 2018-07-21 DIAGNOSIS — M25552 Pain in left hip: Secondary | ICD-10-CM

## 2018-08-09 ENCOUNTER — Ambulatory Visit
Admission: RE | Admit: 2018-08-09 | Discharge: 2018-08-09 | Disposition: A | Discharge status: Home / Self Care | Payer: Medicare Other | Source: Ambulatory Visit | Attending: Family Medicine | Admitting: Family Medicine

## 2018-08-09 DIAGNOSIS — M533 Sacrococcygeal disorders, not elsewhere classified: Secondary | ICD-10-CM | POA: Insufficient documentation

## 2018-08-09 DIAGNOSIS — M47896 Other spondylosis, lumbar region: Secondary | ICD-10-CM | POA: Insufficient documentation

## 2018-08-09 DIAGNOSIS — M25552 Pain in left hip: Secondary | ICD-10-CM | POA: Diagnosis present

## 2018-10-17 ENCOUNTER — Other Ambulatory Visit: Payer: Self-pay | Admitting: Unknown Physician Specialty

## 2018-10-17 DIAGNOSIS — G9601 Cranial cerebrospinal fluid leak, spontaneous: Secondary | ICD-10-CM

## 2018-10-17 DIAGNOSIS — G960 Cerebrospinal fluid leak: Principal | ICD-10-CM

## 2018-10-25 ENCOUNTER — Ambulatory Visit
Admission: RE | Admit: 2018-10-25 | Discharge: 2018-10-25 | Disposition: A | Discharge status: Home / Self Care | Payer: Medicare Other | Source: Ambulatory Visit | Attending: Unknown Physician Specialty | Admitting: Unknown Physician Specialty

## 2018-10-25 DIAGNOSIS — G960 Cerebrospinal fluid leak: Secondary | ICD-10-CM | POA: Diagnosis not present

## 2018-10-25 DIAGNOSIS — G9601 Cranial cerebrospinal fluid leak, spontaneous: Secondary | ICD-10-CM

## 2018-10-25 LAB — POCT I-STAT CREATININE: Creatinine, Ser: 1.1 mg/dL (ref 0.61–1.24)

## 2018-10-25 MED ORDER — GADOBUTROL 1 MMOL/ML IV SOLN
10.0000 mL | Freq: Once | INTRAVENOUS | Status: AC | PRN
  Administered 2018-10-25: 10 mL via INTRAVENOUS

## 2019-03-19 ENCOUNTER — Telehealth (INDEPENDENT_AMBULATORY_CARE_PROVIDER_SITE_OTHER): Payer: Medicare Other | Admitting: Urology

## 2019-03-19 ENCOUNTER — Other Ambulatory Visit: Payer: Self-pay

## 2019-03-19 DIAGNOSIS — N401 Enlarged prostate with lower urinary tract symptoms: Secondary | ICD-10-CM

## 2019-03-19 DIAGNOSIS — R351 Nocturia: Secondary | ICD-10-CM

## 2019-03-19 NOTE — Progress Notes (Signed)
Virtual Visit via Telephone Note  I connected with Brandon Bright on 03/19/19 at  9:00 AM EDT by telephone and verified that I am speaking with the correct person using two identifiers.  Location: Patient: Home Provider: Office   I discussed the limitations, risks, security and privacy concerns of performing an evaluation and management service by telephone and the availability of in person appointments. I also discussed with the patient that there may be a patient responsible charge related to this service. The patient expressed understanding and agreed to proceed.   History of Present Illness: 73 year old male BPH presents for annual follow-up via telephone secondary to COVID-19 pandemic.  He was initially seen in July 2019 for complaints of a weak urinary stream.  His PVR was 0 mL.  He had variable nocturia from 0-6 times per night.  He states overall he has been doing well.  His urinary stream has improved.  He typically has nocturia ranging from 1-3 times per night which he states is not bothersome.  Denies dysuria, gross hematuria or flank/abdominal/pelvic/scrotal pain.    Observations/Objective: N/A  Assessment and Plan: 73 year old male with BPH and nocturia.  We discussed potential etiologies of nocturia.  He states his symptoms are not bothersome and he does not desire any medical management at this time.  Follow Up Instructions: Will continue annual follow-up and he was directed to call earlier should his voiding pattern change significantly.   I discussed the assessment and treatment plan with the patient. The patient was provided an opportunity to ask questions and all were answered. The patient agreed with the plan and demonstrated an understanding of the instructions.   The patient was advised to call back or seek an in-person evaluation if the symptoms worsen or if the condition fails to improve as anticipated.  I provided 5 minutes of non-face-to-face time during  this encounter.   Abbie Sons, MD

## 2019-05-21 DIAGNOSIS — E559 Vitamin D deficiency, unspecified: Secondary | ICD-10-CM | POA: Insufficient documentation

## 2019-06-28 ENCOUNTER — Encounter: Payer: Self-pay | Admitting: Pain Medicine

## 2019-06-30 NOTE — Progress Notes (Signed)
Pain Management Virtual Encounter Note - Virtual Visit via Telephone Telehealth (real-time audio visits between healthcare provider and patient).   Patient's Phone No. & Preferred Pharmacy:  [phone] (home); [phone] (mobile); (Preferred) [phone] [email or web address] .[place], [place] - [address] [place] [address] Phone: [phone] Fax: [phone]    Pre-screening note:  Our staff contacted Brandon Bright and offered him an "in person", "face-to-face" appointment versus a telephone encounter. He indicated preferring the telephone encounter, at this time.   Reason for Virtual Visit: COVID-19*  Social distancing based on CDC and AMA recommendations.   I contacted Brandon Bright on 07/02/2019 via telephone.      I clearly identified myself as Gaspar Cola, MD. I verified that I was speaking with the correct person using two identifiers (Name: Brandon Bright, and date of birth: 08/11/1946).  Advanced Informed Consent I sought verbal advanced consent from Brandon Bright for virtual visit interactions. I informed Brandon Bright of possible security and privacy concerns, risks, and limitations associated with providing "not-in-person" medical evaluation and management services. I also informed Brandon Bright of the availability of "in-person" appointments. Finally, I informed him that there would be a charge for the virtual visit and that he could be  personally, fully or partially, financially responsible for it. [name] expressed understanding and agreed to proceed.   Historic Elements   Brandon Bright is a 73 y.o. year old, male patient evaluated today after his last encounter by our practice on Visit date not found. 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 He also  has a past surgical history that includes ganglion cyst removal (Right); Cataract extraction w/PHACO (Left, 02/04/2016); Colonoscopy; Pilonidal cyst excision; Cardiac catheterization (1999?); Cataract extraction w/PHACO (Right, 03/03/2016); Eye surgery (May and June); and Colonoscopy with propofol (N/A, 06/05/2018). Brandon Bright has a current medication list which includes the following prescription(s): amlodipine, aspirin, cholecalciferol, fluticasone, gabapentin, hypertonic nasal wash, latanoprost, melatonin, centrum silver 50+men, multiple vitamins-minerals, olmesartan-hydrochlorothiazide, simvastatin, and metoprolol succinate. He  reports that he has never smoked. He has never used smokeless tobacco. He reports that he does not drink alcohol or use drugs. Brandon Bright is allergic to codeine; vicodin [hydrocodone-acetaminophen]; and celebrex [celecoxib].   HPI  Today, he is being contacted for follow-up evaluation.  Patient last seen on 07/12/2018 by Dionisio David, NP.  The patient indicates doing extremely well and in fact he thinks he is doing better than last year.  We normally see him every year on follow-up just to keep his record open in case he needs to have any epidural steroid injections done again.  At one point he had to have some of those done and they did work very well for him.  I have personally not done any injections for him, but he used to be a patient of Dr. Mohammed Kindle.  Pharmacotherapy Assessment  Analgesic: No opioid analgesics prescribed by our practice.   Monitoring: Pharmacotherapy: No side-effects or adverse reactions reported. [place] PMP: PDMP reviewed during this encounter.       Compliance: No problems identified. Effectiveness: Clinically acceptable. Plan: Refer to "POC".  UDS: No results found for: SUMMARY Laboratory  Chemistry Profile (12 mo)  Renal: 10/25/2018: Creatinine, Ser 1.10  Lab Results  Component  Value Date   GFRAA >60 08/17/2014   GFRNONAA >60 08/17/2014   Hepatic: No results found for requested labs within last 8760 hours. Lab Results  Component Value Date   AST 15 08/17/2014   ALT 20 08/17/2014   Other: No results found for requested labs within last 8760 hours. Note: Above Lab results reviewed.  Imaging  Last 90 days:  No results found.  Assessment  The primary encounter diagnosis was Chronic pain syndrome. 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  Plan of Care  I have discontinued Brandon Bright. Brandon "Jim"'s docusate sodium and ICaps. I am also having him maintain his simvastatin, aspirin, Multiple Vitamins-Minerals (PRESERVISION AREDS PO), latanoprost, Hypertonic Nasal Wash (SINUS RINSE KIT NA), gabapentin, metoprolol succinate, fluticasone, Centrum Silver 50+Men, amLODipine, Melatonin, olmesartan-hydrochlorothiazide, and cholecalciferol.  Pharmacotherapy (Medications Ordered): No orders of the defined types were placed in this encounter.  Orders:  Orders Placed This Encounter  Procedures  . Lumbar Epidural Injection    Standing Status:   Standing    Number of Occurrences:   9    Standing Expiration Date:   12/30/2020    Scheduling Instructions:     Purpose: Palliative     Indication: Lower extremity pain/Sciatica right (M54.31).     Side: Right-sided     Level: L4-5     Sedation: Patient's choice.     TIMEFRAME: PRN procedure. (Mr. Merlo will call when needed.)    Order Specific Question:   Where will this procedure be performed?    Answer:   [facility]   Follow-up plan:    Return in about 52 weeks (around 06/30/2020) for (VV), E/M, in addition, PRN Procedure: (R) L4-5 LESI #1.      Interventional management options: Planned, scheduled, and/or pending:   None at this time    Considering:   Diagnostic right L3 TFESI  Diagnostic right L4 TFESI  Diagnostic bilateral lumbar facet block  Possible bilateral lumbar facet RFA  Diagnostic Trigger point injection  Diagnostic  bilateral cervical facet nerve block  Possible bilateral cervical facet RFA    Palliative PRN treatment(s):   Palliative right L4-5 LESI (none done by me) The patient indicated having had LESI x 4, by Dr. Primus Bravo. (before 2000) (Procedure described as helpful).      Recent Visits No visits were found meeting these conditions.  Showing recent visits within past 90 days and meeting all other requirements   Today's Visits Date Type Provider Dept  07/02/19 Office Visit Milinda Pointer, MD [facility]  Showing today's visits and meeting all other requirements   Future Appointments No visits were found meeting these conditions.  Showing future appointments within next 90 days and meeting all other requirements   I discussed the assessment and treatment plan with the patient. The patient was provided an opportunity to ask questions and all were answered. The patient agreed with the plan and demonstrated an understanding of the instructions.  Patient advised to call back or seek an in-person evaluation if the symptoms or condition worsens.  Total duration of non-face-to-face encounter: 12 minutes.  Note by: Gaspar Cola, MD Date: 07/02/2019; Time: 9:35 AM  Note: This dictation was prepared with Dragon dictation. Any transcriptional errors that may result  from this process are unintentional.  Disclaimer:  * Given the special circumstances of the COVID-19 pandemic, the federal government has announced that the Office for Civil Rights (OCR) will exercise its enforcement  discretion and will not impose penalties on physicians using telehealth in the event of noncompliance with regulatory requirements under the [place] (HIPAA) in connection with the good faith provision of telehealth during the BBCWU-88 national public health emergency. ([place])

## 2019-07-02 ENCOUNTER — Other Ambulatory Visit: Payer: Self-pay

## 2019-07-02 ENCOUNTER — Ambulatory Visit: Payer: Medicare Other | Attending: Nurse Practitioner | Admitting: Pain Medicine

## 2019-07-02 DIAGNOSIS — M542 Cervicalgia: Secondary | ICD-10-CM

## 2019-07-02 DIAGNOSIS — G894 Chronic pain syndrome: Secondary | ICD-10-CM

## 2019-07-02 DIAGNOSIS — M5030 Other cervical disc degeneration, unspecified cervical region: Secondary | ICD-10-CM

## 2019-07-02 DIAGNOSIS — M5441 Lumbago with sciatica, right side: Secondary | ICD-10-CM

## 2019-07-02 DIAGNOSIS — M79604 Pain in right leg: Secondary | ICD-10-CM

## 2019-07-02 DIAGNOSIS — M5136 Other intervertebral disc degeneration, lumbar region: Secondary | ICD-10-CM

## 2019-07-02 DIAGNOSIS — G8929 Other chronic pain: Secondary | ICD-10-CM

## 2019-07-02 NOTE — Patient Instructions (Signed)

## 2019-07-03 ENCOUNTER — Ambulatory Visit: Payer: Medicare Other | Admitting: Nurse Practitioner

## 2019-07-10 ENCOUNTER — Ambulatory Visit: Payer: Medicare Other | Admitting: Nurse Practitioner

## 2020-03-18 ENCOUNTER — Ambulatory Visit: Payer: Medicare Other | Admitting: Urology

## 2020-03-18 DIAGNOSIS — E119 Type 2 diabetes mellitus without complications: Secondary | ICD-10-CM | POA: Insufficient documentation

## 2020-03-19 ENCOUNTER — Ambulatory Visit (INDEPENDENT_AMBULATORY_CARE_PROVIDER_SITE_OTHER): Payer: Medicare PPO | Admitting: Urology

## 2020-03-19 ENCOUNTER — Encounter: Payer: Self-pay | Admitting: Urology

## 2020-03-19 ENCOUNTER — Other Ambulatory Visit: Payer: Self-pay

## 2020-03-19 VITALS — BP 129/73 | HR 93 | Ht 70.0 in | Wt 226.7 lb

## 2020-03-19 DIAGNOSIS — N400 Enlarged prostate without lower urinary tract symptoms: Secondary | ICD-10-CM | POA: Diagnosis not present

## 2020-03-19 NOTE — Progress Notes (Signed)
03/19/2020 2:16 PM   Brandon Bright [birth year] [id number]  Referring provider: Donnamarie Rossetti, PA-C [place],  [place] [address] Chief Complaint  Patient presents with  . Benign Prostatic Hypertrophy    HPI: Brandon Bright is a 74 y.o. male who presents today for a follow up.  -Prior history significant nocturia up to 6 times per night -No complaints, he is doing well -Nocturia has resolved -Denies gross hematuria, dysuria -On no GU medications  PMH: Past Medical History:  Diagnosis Date  . AGE (acute gastroenteritis) 08/19/2014  . Anemia    hx of  . ARF (acute renal failure) ([place]) 08/19/2014  . 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 Chronic kidney disease    acute renal failue?  . Colon polyps   . Diverticulitis   . Gallstones   . Hypercholesterolemia   . Hypertension    controlled on meds  . Hypokalemia 01/02/2016  . Peripheral vascular disease ([facility])    varicose veins  . Sclerosing mesenteritis ([place]) 12/15/2015    Surgical History: Past Surgical History:  Procedure Laterality Date  . CARDIAC CATHETERIZATION  1999?   no issue/ DR Nehemiah Massed  . CATARACT EXTRACTION W/PHACO Left 02/04/2016   Procedure: CATARACT EXTRACTION PHACO AND INTRAOCULAR LENS PLACEMENT (IOC) LEFT EYE;  Surgeon: Leandrew Koyanagi, MD;  Location: [facility];  Service: Ophthalmology;  Laterality: Left;  . CATARACT EXTRACTION W/PHACO Right 03/03/2016   Procedure: CATARACT EXTRACTION PHACO AND INTRAOCULAR LENS PLACEMENT (IOC);  Surgeon: Leandrew Koyanagi, MD;  Location: [facility];  Service: Ophthalmology;  Laterality: Right;  . COLONOSCOPY    . COLONOSCOPY WITH PROPOFOL N/A 06/05/2018   Procedure: COLONOSCOPY WITH PROPOFOL;  Surgeon: Manya Silvas, MD;  Location: [facility];  Service: Endoscopy;  Laterality: N/A;  . EYE SURGERY  May and June   bilateral cataract  . ganglion cyst removal Right   . PILONIDAL CYST EXCISION      Home  Medications:  Allergies as of 03/19/2020      Reactions   Codeine Swelling, Rash   throat   Vicodin [hydrocodone-acetaminophen] Swelling   throat   Celebrex [celecoxib]    Abdominal pain      Medication List       Accurate as of March 19, 2020  2:16 PM. If you have any questions, ask your nurse or doctor.        amLODipine 2.5 MG tablet Commonly known as: NORVASC Take 5 mg by mouth daily.   aspirin 325 MG tablet Take 325 mg by mouth daily. am   cholecalciferol 25 MCG (1000 UNIT) tablet Commonly known as: VITAMIN D3 Take 1,000 Units by mouth daily.   fluticasone 50 MCG/ACT nasal spray Commonly known as: FLONASE Place 2 sprays into both nostrils daily.   gabapentin 300 MG capsule Commonly known as: NEURONTIN Limit 2-3 tablets by mouth per day or twice per day if tolerated   latanoprost 0.005 % ophthalmic solution Commonly known as: XALATAN Place 1 drop into both eyes at bedtime.   Melatonin 5 MG Chew Chew 5 mg by mouth at bedtime.   metoprolol succinate 50 MG 24 hr tablet Commonly known as: TOPROL-XL Take 50 mg by mouth daily.   Multi-Vitamin tablet Take 1 tablet by mouth daily.   olmesartan-hydrochlorothiazide 40-12.5 MG tablet Commonly known as: BENICAR HCT Take 1 tablet by mouth daily.   PRESERVISION AREDS PO Take 1 tablet by mouth 2 (two) times daily. Am and dinner  Centrum Silver 50+Men Tabs Take 1 tablet by mouth daily.   simvastatin 20 MG tablet Commonly known as: ZOCOR Take 20 mg by mouth daily. bedtime   SINUS RINSE KIT NA Place into the nose as needed.   valsartan-hydrochlorothiazide 160-12.5 MG tablet Commonly known as: DIOVAN-HCT       Allergies:  Allergies  Allergen Reactions  . Codeine Swelling and Rash    throat  . Vicodin [Hydrocodone-Acetaminophen] Swelling    throat  . Celebrex [Celecoxib]     Abdominal pain    Family History: Family History  Problem Relation Age of Onset  . Heart disease Father   . Dementia  Mother   . Heart disease Brother   . Heart disease Brother   . Diabetes Brother     Social History:  reports that he has never smoked. He has never used smokeless tobacco. He reports that he does not drink alcohol and does not use drugs.   Physical Exam: BP 129/73   Pulse 93   Ht _0  (1.778 m)   Wt 226 lb 11.2 oz (102.8 kg)   BMI 32.53 kg/m   Constitutional:  Alert and oriented, No acute distress. HEENT: [place] AT, moist mucus membranes.  Trachea midline, no masses. Cardiovascular: No clubbing, cyanosis, or edema. Respiratory: Normal respiratory effort, no increased work of breathing. Skin: No rashes, bruises or suspicious lesions. Neurologic: Grossly intact, no focal deficits, moving all 4 extremities. Psychiatric: Normal mood and affect.   Assessment & Plan:   1. BPH without LUTS -Voiding symptoms/nocturia have resolved -Follow up with PRN  [facility] [address], [place], [place] [address] [phone]  I, Joneen Boers Peace, am acting as a Education administrator for Dr. Nicki Reaper C. Hershal Eriksson.  I have reviewed the above documentation for accuracy and completeness, and I agree with the above.   Abbie Sons, MD

## 2020-06-29 NOTE — Progress Notes (Signed)
Patient: Brandon Bright  Service Category: E/M  Provider: Gaspar Cola, MD  DOB: [birth year]  DOS: 06/30/2020  Location: Office  MRN: [id number]  Setting: Ambulatory outpatient  Referring Provider: Donnamarie Rossetti,*  Type: Established Patient  Specialty: Interventional Pain Management  PCP: Donnamarie Rossetti, PA-C  Location: Remote location  Delivery: TeleHealth     Virtual Encounter - Pain Management PROVIDER NOTE: Information contained herein reflects review and annotations entered in association with encounter. Interpretation of such information and data should be left to medically-trained personnel. Information provided to patient can be located elsewhere in the medical record under "Patient Instructions". Document created using STT-dictation technology, any transcriptional errors that may result from process are unintentional.    Contact & Pharmacy Preferred: [phone] Home: [phone] (home) Mobile: [phone] (mobile) E-mail: [email or web address] .[place], [place] - [place] [address] Phone: [phone] Fax: [phone]   Pre-screening  Mr. Oleski offered "in-person" vs "virtual" encounter. He indicated preferring virtual for this encounter.   Reason COVID-19*  Social distancing based on CDC and AMA recommendations.   I contacted Maryagnes Amos on 06/30/2020 via telephone.      I clearly identified myself as Gaspar Cola, MD. I verified that I was speaking with the correct person using two identifiers (Name: SEWARD CORAN, and date of birth: 1945/12/28).  Consent I sought verbal advanced consent from Maryagnes Amos for virtual visit interactions. I informed Mr. Brayboy of possible security and privacy concerns, risks, and limitations associated with providing "not-in-person" medical evaluation and management services. I also informed Mr. Councilman of the availability of "in-person"  appointments. Finally, I informed him that there would be a charge for the virtual visit and that he could be  personally, fully or partially, financially responsible for it. Mr. Ellery expressed understanding and agreed to proceed.   Historic Elements   Mr. OZRO RUSSETT is a 74 y.o. year old, male patient evaluated today after our last contact on Visit date not found. Mr. 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 He also  has a past surgical history that includes ganglion cyst removal (Right); Cataract extraction w/PHACO (Left, 02/04/2016); Colonoscopy; Pilonidal cyst excision; Cardiac catheterization (1999?); Cataract extraction w/PHACO (Right, 03/03/2016); Eye surgery (May and June); and Colonoscopy with propofol (N/A, 06/05/2018). Mr. Poplaski has a current medication list which includes the following prescription(s): amlodipine, aspirin, cholecalciferol, fluticasone, gabapentin, hypertonic nasal wash, latanoprost, melatonin, metoprolol succinate, multi-vitamin, centrum silver 50+men, multiple vitamins-minerals, simvastatin, and valsartan-hydrochlorothiazide. He  reports that he has never smoked. He has never used smokeless tobacco. He reports that he does not drink alcohol and does not use drugs. Mr. Sawyers is allergic to codeine, vicodin [hydrocodone-acetaminophen], and celebrex [celecoxib].   HPI  Today, he is being contacted for follow-up evaluation.  The last encounter with this patient was on 07/02/2019 and before that on 07/12/2018 by Dionisio David, NP.  The patient indicates doing well and having no problems.  We normally contact him every year on follow-up just to keep his record open in case  he needs to have any epidural steroid injections done again.  At one point he had to have some of those done and they did work very well for him.  I have personally not  done any injections for him, but he used to be a patient of Dr. Mohammed Kindle.  Pharmacotherapy Assessment  Analgesic: No opioid analgesics prescribed by our practice.   Monitoring: [place] PMP: PDMP reviewed during this encounter.       Pharmacotherapy: No side-effects or adverse reactions reported. Compliance: No problems identified. Effectiveness: Clinically acceptable. Plan: Refer to "POC".  UDS: No results found for: SUMMARY  Laboratory Chemistry Profile   Renal Lab Results  Component Value Date   BUN 16 08/17/2014   CREATININE 1.10 10/25/2018   GFRAA >60 08/17/2014   GFRNONAA >60 08/17/2014     Hepatic Lab Results  Component Value Date   AST 15 08/17/2014   ALT 20 08/17/2014   ALBUMIN 2.7 (L) 08/17/2014   ALKPHOS 43 (L) 08/17/2014   LIPASE 75 08/16/2014     Electrolytes Lab Results  Component Value Date   NA 144 08/17/2014   K 3.3 (L) 08/17/2014   CL 111 (H) 08/17/2014   CALCIUM 7.5 (L) 08/17/2014   MG 1.7 (L) 08/16/2014     Bone No results found for: VD25OH, [id number], DV7616WV3, XT0626RS8, 25OHVITD1, 25OHVITD2, 25OHVITD3, TESTOFREE, TESTOSTERONE   Inflammation (CRP: Acute Phase) (ESR: Chronic Phase) No results found for: CRP, ESRSEDRATE, LATICACIDVEN     Note: Above Lab results reviewed.  Imaging  MR BRAIN W WO CONTRAST CLINICAL DATA:  CSF rhinorrhea.  Possible sinus drainage.  EXAM: MRI HEAD WITHOUT AND WITH CONTRAST  TECHNIQUE: Multiplanar, multiecho pulse sequences of the brain and surrounding structures were obtained without and with intravenous contrast.  CONTRAST:  10 mL Gadavist  COMPARISON:  None.  FINDINGS: Brain: No acute infarct, hemorrhage, or mass lesion is present. Moderate atrophy and white matter disease is present bilaterally.  The ventricles are of  proportionate to the degree of atrophy. No significant extraaxial fluid collection is present.  The internal auditory canals are within normal limits. 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 Brainstem and cerebellum are otherwise normal.  Postcontrast images demonstrate no pathologic enhancement.  The cribriform plate extends inferiorly right-greater-than-left slight asymmetry. Fovea ethmoidalis measures 6 mm on the right and 8 mm on the left. This increases possibility of a CSF leak.  Vascular: Flow is present in the major intracranial arteries.  Skull and upper cervical spine: Skull base is otherwise unremarkable. Craniocervical junction is normal.  Sinuses/Orbits: No fluid levels are present. Small polyps or mucous retention cysts are present within the right maxillary sinus. Mastoid air cells are clear.  IMPRESSION: 1. 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 2. No fluid in the sinuses to suggest active CSF leak. 3. Moderate atrophy and white matter disease bilaterally likely reflects the sequela of chronic microvascular ischemia 4. No acute intracranial abnormality.  Electronically Signed   By: San Morelle M.D.   On: 10/25/2018 11:58  Assessment  The primary encounter diagnosis was Chronic pain syndrome. 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  Plan of Care  Problem-specific:  No problem-specific Assessment & Plan notes found for this encounter.  Mr. ADRIELL POLANSKY has a current medication list which includes the following long-term medication(s): amlodipine, fluticasone, gabapentin, hypertonic nasal wash, metoprolol succinate,  simvastatin, and valsartan-hydrochlorothiazide.  Pharmacotherapy (Medications Ordered):  No orders of the defined types were placed in this encounter.  Orders:  No orders of the defined types were placed in this encounter.  Follow-up plan:   Return if symptoms worsen or fail to improve.      Interventional management options: Planned, scheduled, and/or pending:   None at this time    Considering:   Diagnostic right L3 TFESI  Diagnostic right L4 TFESI  Diagnostic bilateral lumbar facet block  Possible bilateral lumbar facet RFA  Diagnostic Trigger point injection  Diagnostic  bilateral cervical facet nerve block  Possible bilateral cervical facet RFA    Palliative PRN treatment(s):   Palliative right L4-5 LESI (none done by me) The patient indicated having had LESI x 4, by Dr. Primus Bravo. (before 2000) (Procedure described as helpful).       Recent Visits No visits were found meeting these conditions. Showing recent visits within past 90 days and meeting all other requirements Today's Visits Date Type Provider Dept  06/30/20 Telemedicine Milinda Pointer, MD [facility]  Showing today's visits and meeting all other requirements Future Appointments No visits were found meeting these conditions. Showing future appointments within next 90 days and meeting all other requirements  I discussed the assessment and treatment plan with the patient. The patient was provided an opportunity to ask questions and all were answered. The patient agreed with the plan and demonstrated an understanding of the instructions.  Patient advised to call back or seek an in-person evaluation if the symptoms or condition worsens.  Duration of encounter: 7 minutes.  Note by: Gaspar Cola, MD Date: 06/30/2020; Time: 8:16 AM

## 2020-06-30 ENCOUNTER — Encounter: Payer: Self-pay | Admitting: Pain Medicine

## 2020-06-30 ENCOUNTER — Ambulatory Visit: Payer: Medicare PPO | Attending: Pain Medicine | Admitting: Pain Medicine

## 2020-06-30 ENCOUNTER — Other Ambulatory Visit: Payer: Self-pay

## 2020-06-30 DIAGNOSIS — M5136 Other intervertebral disc degeneration, lumbar region: Secondary | ICD-10-CM

## 2020-06-30 DIAGNOSIS — M5441 Lumbago with sciatica, right side: Secondary | ICD-10-CM | POA: Diagnosis not present

## 2020-06-30 DIAGNOSIS — G894 Chronic pain syndrome: Secondary | ICD-10-CM | POA: Diagnosis not present

## 2020-06-30 DIAGNOSIS — M79604 Pain in right leg: Secondary | ICD-10-CM | POA: Diagnosis not present

## 2020-06-30 DIAGNOSIS — M48061 Spinal stenosis, lumbar region without neurogenic claudication: Secondary | ICD-10-CM

## 2020-06-30 DIAGNOSIS — G8929 Other chronic pain: Secondary | ICD-10-CM

## 2020-08-28 ENCOUNTER — Other Ambulatory Visit: Payer: Self-pay

## 2020-08-28 ENCOUNTER — Ambulatory Visit
Admission: RE | Admit: 2020-08-28 | Discharge: 2020-08-28 | Disposition: A | Discharge status: Home / Self Care | Payer: Medicare PPO | Source: Ambulatory Visit | Attending: Pain Medicine | Admitting: Pain Medicine

## 2020-08-28 ENCOUNTER — Encounter: Payer: Self-pay | Admitting: Pain Medicine

## 2020-08-28 ENCOUNTER — Ambulatory Visit (HOSPITAL_BASED_OUTPATIENT_CLINIC_OR_DEPARTMENT_OTHER): Payer: Medicare PPO | Admitting: Pain Medicine

## 2020-08-28 VITALS — BP 142/84 | HR 88 | Temp 97.5°F | Resp 16 | Ht 70.0 in | Wt 226.0 lb

## 2020-08-28 DIAGNOSIS — M1612 Unilateral primary osteoarthritis, left hip: Secondary | ICD-10-CM

## 2020-08-28 DIAGNOSIS — G8929 Other chronic pain: Secondary | ICD-10-CM

## 2020-08-28 DIAGNOSIS — M25552 Pain in left hip: Secondary | ICD-10-CM | POA: Insufficient documentation

## 2020-08-28 MED ORDER — PREDNISONE 20 MG PO TABS: ORAL_TABLET | ORAL | 0 refills | Status: AC

## 2020-08-28 NOTE — Patient Instructions (Addendum)
- Please remember to switch from the 325 mg aspirin to the 81 mg aspirin. ____________________________________________________________________________________________  Preparing for your procedure (without sedation)  Procedure appointments are limited to planned procedures: . No Prescription Refills. . No disability issues will be discussed. . No medication changes will be discussed.  Instructions: . Oral Intake: Do not eat or drink anything for at least 6 hours prior to your procedure. (Exception: Blood Pressure Medication. See below.) . Transportation: Unless otherwise stated by your physician, you may drive yourself after the procedure. . Blood Pressure Medicine: Do not forget to take your blood pressure medicine with a sip of water the morning of the procedure. If your Diastolic (lower reading)is above 100 mmHg, elective cases will be cancelled/rescheduled. . Blood thinners: These will need to be stopped for procedures. Notify our staff if you are taking any blood thinners. Depending on which one you take, there will be specific instructions on how and when to stop it. . Diabetics on insulin: Notify the staff so that you can be scheduled 1st case in the morning. If your diabetes requires high dose insulin, take only  of your normal insulin dose the morning of the procedure and notify the staff that you have done so. . Preventing infections: Shower with an antibacterial soap the morning of your procedure.  . Build-up your immune system: Take 1000 mg of Vitamin C with every meal (3 times a day) the day prior to your procedure. Marland Kitchen Antibiotics: Inform the staff if you have a condition or reason that requires you to take antibiotics before dental procedures. . Pregnancy: If you are pregnant, call and cancel the procedure. . Sickness: If you have a cold, fever, or any active infections, call and cancel the procedure. . Arrival: You must be in the facility at least 30 minutes prior to your  scheduled procedure. . Children: Do not bring any children with you. . Dress appropriately: Bring dark clothing that you would not mind if they get stained. . Valuables: Do not bring any jewelry or valuables.  Reasons to call and reschedule or cancel your procedure: (Following these recommendations will minimize the risk of a serious complication.) . Surgeries: Avoid having procedures within 2 weeks of any surgery. (Avoid for 2 weeks before or after any surgery). . Flu Shots: Avoid having procedures within 2 weeks of a flu shots or . (Avoid for 2 weeks before or after immunizations). . Barium: Avoid having a procedure within 7-10 days after having had a radiological study involving the use of radiological contrast. (Myelograms, Barium swallow or enema study). . Heart attacks: Avoid any elective procedures or surgeries for the initial 6 months after a "Myocardial Infarction" (Heart Attack). . Blood thinners: It is imperative that you stop these medications before procedures. Let us know if you if you take any blood thinner.  . Infection: Avoid procedures during or within two weeks of an infection (including chest colds or gastrointestinal problems). Symptoms associated with infections include: Localized redness, fever, chills, night sweats or profuse sweating, burning sensation when voiding, cough, congestion, stuffiness, runny nose, sore throat, diarrhea, nausea, vomiting, cold or Flu symptoms, recent or current infections. It is specially important if the infection is over the area that we intend to treat. Marland Kitchen Heart and lung problems: Symptoms that may suggest an active cardiopulmonary problem include: cough, chest pain, breathing difficulties or shortness of breath, dizziness, ankle swelling, uncontrolled high or unusually low blood pressure, and/or palpitations. If you are experiencing any of these symptoms, cancel  your procedure and contact your primary care physician for an evaluation.  Remember:   Regular Business hours are:  Monday to Thursday 8:00 AM to 4:00 PM  Provider's Schedule: Naziah Weckerly, MD:  Procedure days: Tuesday and Thursday 7:30 AM to 4:00 PM  Bilal Lateef, MD:  Procedure days: Monday and Wednesday 7:30 AM to 4:00 PM ____________________________________________________________________________________________   ____________________________________________________________________________________________  General Risks and Possible Complications  Patient Responsibilities: It is important that you read this as it is part of your informed consent. It is our duty to inform you of the risks and possible complications associated with treatments offered to you. It is your responsibility as a patient to read this and to ask questions about anything that is not clear or that you believe was not covered in this document.  Patient's Rights: You have the right to refuse treatment. You also have the right to change your mind, even after initially having agreed to have the treatment done. However, under this last option, if you wait until the last second to change your mind, you may be charged for the materials used up to that point.  Introduction: Medicine is not an exact science. Everything in Medicine, including the lack of treatment(s), carries the potential for danger, harm, or loss (which is by definition: Risk). In Medicine, a complication is a secondary problem, condition, or disease that can aggravate an already existing one. All treatments carry the risk of possible complications. The fact that a side effects or complications occurs, does not imply that the treatment was conducted incorrectly. It must be clearly understood that these can happen even when everything is done following the highest safety standards.  No treatment: You can choose not to proceed with the proposed treatment alternative. The "PRO(s)" would include: avoiding the risk of complications associated  with the therapy. The "CON(s)" would include: not getting any of the treatment benefits. These benefits fall under one of three categories: diagnostic; therapeutic; and/or palliative. Diagnostic benefits include: getting information which can ultimately lead to improvement of the disease or symptom(s). Therapeutic benefits are those associated with the successful treatment of the disease. Finally, palliative benefits are those related to the decrease of the primary symptoms, without necessarily curing the condition (example: decreasing the pain from a flare-up of a chronic condition, such as incurable terminal cancer).  General Risks and Complications: These are associated to most interventional treatments. They can occur alone, or in combination. They fall under one of the following six (6) categories: no benefit or worsening of symptoms; bleeding; infection; nerve damage; allergic reactions; and/or death. 1. No benefits or worsening of symptoms: In Medicine there are no guarantees, only probabilities. No healthcare provider can ever guarantee that a medical treatment will work, they can only state the probability that it may. Furthermore, there is always the possibility that the condition may worsen, either directly, or indirectly, as a consequence of the treatment. 2. Bleeding: This is more common if the patient is taking a blood thinner, either prescription or over the counter (example: Goody Powders, Fish oil, Aspirin, Garlic, etc.), or if suffering a condition associated with impaired coagulation (example: Hemophilia, cirrhosis of the liver, low platelet counts, etc.). However, even if you do not have one on these, it can still happen. If you have any of these conditions, or take one of these drugs, make sure to notify your treating physician. 3. Infection: This is more common in patients with a compromised immune system, either due to disease (example: diabetes, cancer,   human immunodeficiency virus  [HIV], etc.), or due to medications or treatments (example: therapies used to treat cancer and rheumatological diseases). However, even if you do not have one on these, it can still happen. If you have any of these conditions, or take one of these drugs, make sure to notify your treating physician. 4. Nerve Damage: This is more common when the treatment is an invasive one, but it can also happen with the use of medications, such as those used in the treatment of cancer. The damage can occur to small secondary nerves, or to large primary ones, such as those in the spinal cord and brain. This damage may be temporary or permanent and it may lead to impairments that can range from temporary numbness to permanent paralysis and/or brain death. 5. Allergic Reactions: Any time a substance or material comes in contact with our body, there is the possibility of an allergic reaction. These can range from a mild skin rash (contact dermatitis) to a severe systemic reaction (anaphylactic reaction), which can result in death. 6. Death: In general, any medical intervention can result in death, most of the time due to an unforeseen complication. ____________________________________________________________________________________________  ____________________________________________________________________________________________  Blood Thinners  IMPORTANT NOTICE:  If you take any of these, make sure to notify the nursing staff.  Failure to do so may result in injury.  Recommended time intervals to stop and restart blood-thinners, before & after invasive procedures  Generic Name Brand Name Stop Time. Must be stopped at least this long before procedures. After procedures, wait at least this long before re-starting.  Abciximab Reopro 15 days 2 hrs  Alteplase Activase 10 days 10 days  Anagrelide Agrylin    Apixaban Eliquis 3 days 6 hrs  Cilostazol Pletal 3 days 5 hrs  Clopidogrel Plavix 7-10 days 2 hrs  Dabigatran  Pradaxa 5 days 6 hrs  Dalteparin Fragmin 24 hours 4 hrs  Dipyridamole Aggrenox 11days 2 hrs  Edoxaban Lixiana; Savaysa 3 days 2 hrs  Enoxaparin  Lovenox 24 hours 4 hrs  Eptifibatide Integrillin 8 hours 2 hrs  Fondaparinux  Arixtra 72 hours 12 hrs  Prasugrel Effient 7-10 days 6 hrs  Reteplase Retavase 10 days 10 days  Rivaroxaban Xarelto 3 days 6 hrs  Ticagrelor Brilinta 5-7 days 6 hrs  Ticlopidine Ticlid 10-14 days 2 hrs  Tinzaparin Innohep 24 hours 4 hrs  Tirofiban Aggrastat 8 hours 2 hrs  Warfarin Coumadin 5 days 2 hrs   Other medications with blood-thinning effects  Product indications Generic (Brand) names Note  Cholesterol Lipitor Stop 4 days before procedure  Blood thinner (injectable) Heparin (LMW or LMWH Heparin) Stop 24 hours before procedure  Cancer Ibrutinib (Imbruvica) Stop 7 days before procedure  Malaria/Rheumatoid Hydroxychloroquine (Plaquenil) Stop 11 days before procedure  Thrombolytics  10 days before or after procedures   Over-the-counter (OTC) Products with blood-thinning effects  Product Common names Stop Time  Aspirin > 325 mg Goody Powders, Excedrin, etc. 11 days  Aspirin ? 81 mg  7 days  Fish oil  4 days  Garlic supplements  7 days  Ginkgo biloba  36 hours  Ginseng  24 hours  NSAIDs Ibuprofen, Naprosyn, etc. 3 days  Vitamin E  4 days   ____________________________________________________________________________________________

## 2020-08-28 NOTE — Progress Notes (Signed)
Safety precautions to be maintained throughout the outpatient stay will include: orient to surroundings, keep bed in low position, maintain call bell within reach at all times, provide assistance with transfer out of bed and ambulation.  

## 2020-08-28 NOTE — Progress Notes (Signed)
PROVIDER NOTE: Information contained herein reflects review and annotations entered in association with encounter. Interpretation of such information and data should be left to medically-trained personnel. Information provided to patient can be located elsewhere in the medical record under "Patient Instructions". Document created using STT-dictation technology, any transcriptional errors that may result from process are unintentional.    Patient: Brandon Bright  Service Category: E/M  Provider: Gaspar Cola, MD  DOB: 1946/02/15  DOS: 08/28/2020  Specialty: Interventional Pain Management  MRN: [id number]  Setting: Ambulatory outpatient  PCP: Donnamarie Rossetti, PA-C  Type: Established Patient    Referring Provider: Donnamarie Rossetti,*  Location: Office  Delivery: Face-to-face     HPI  Brandon Bright, a 74 y.o. year old male, is here today because of his Chronic hip pain, left [M25.552, G89.29]. Brandon Bright primary complain today is Hip Pain (left ) and Back Pain (lower left ) Last encounter: My last encounter with him was on Visit date not found. 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 Pain Assessment: Severity of Acute pain is reported as a  10-Worst pain ever (at its worse is a 10, not so bad when sitting and then standing but with walking any distance the pain starts.)/10. Location: Hip Left/denies. Onset: More than a month ago. Quality: Discomfort, Aching. Timing: Intermittent. Modifying factor(s): nothing currently, staying off feet can make the pain not so bad but is still there. Vitals:  height is _0  (1.778 m) and weight is 226 lb (102.5 kg). His temporal temperature is 97.5 F (36.4 C) (abnormal). His blood pressure is 142/84 (abnormal) and his pulse is 88. His respiration is 16 and oxygen saturation is 96%.   Reason for encounter: new problems.  The patient comes into the clinic today with left hip pain.  He indicates that he cannot put weight on it.  Since the patient's last visit to our clinic, he has been diagnosed with type 2 diabetes but he is currently on no medications.  In fact, he was not aware that he had been diagnosed.  I checked on the [facility] labs and he does have his hemoglobin A1c elevated x3 different occasions.  Physical exam today demonstrates the patient to have pain in the hip joint on straight leg raise on the left side.  Right side was completely negative.  No radicular symptoms.  Pain is located right over the trochanteric area and somewhat posterior.  Patient of the area did not trigger exact reproduction of the patient's pain upon putting pressure over the trochanteric bursa.  He did have some discomfort with pressure in the posterior aspect of the trochanteric bursa.  He also presents with decreased range of motion of the hip joint on abduction and abduction as well as internal and external rotation.  Patrick maneuver was negative for sacroiliac joint discomfort but positive for left hip joint arthralgia.  The  patient does have an older MRI of the left hip from 2019 that at that time demonstrated no acute findings.  They do mention moderate lower lumbar spondylosis and mild sacroiliac degenerative changes  bilaterally.  However thigh thrust was positive for pain in the hip area but not SI joint pain.  The rest of the SI joint tests were negative.  Today I will go ahead and give the patient a steroid taper and I will also send him to have some x-rays of the left hip looking for any possible acute pathology.  We will be ordering a sed rate and C-reactive protein as well.  I will have him complete the steroid taper, which I reminded him will increase his blood sugar, and I will schedule him to come back for a possible left intra-articular hip joint injection under fluoroscopic guidance should the taper not take care of his pain.  The plan was shared with the patient who understood and accepted.  Pharmacotherapy Assessment   Analgesic: No opioid analgesics prescribed by our practice.   Monitoring: [place] PMP: PDMP reviewed during this encounter.       Pharmacotherapy: No side-effects or adverse reactions reported. Compliance: No problems identified. Effectiveness: Clinically acceptable.  Janett Billow, RN  08/28/2020 11:07 AM  Sign when Signing Visit Safety precautions to be maintained throughout the outpatient stay will include: orient to surroundings, keep bed in low position, maintain call bell within reach at all times, provide assistance with transfer out of bed and ambulation.     UDS: No results found for: SUMMARY   ROS  Constitutional: Denies any fever or chills Gastrointestinal: No reported hemesis, hematochezia, vomiting, or acute GI distress Musculoskeletal: Denies any acute onset joint swelling, redness, loss of ROM, or weakness Neurological: No reported episodes of acute onset apraxia, aphasia, dysarthria, agnosia, amnesia, paralysis, loss of coordination, or loss of consciousness  Medication Review  Centrum Silver 50+Men, Hypertonic Nasal Wash, Melatonin, Multi-Vitamin, Multiple Vitamins-Minerals, amLODipine, aspirin, cholecalciferol, doxycycline, fluticasone, gabapentin,  latanoprost, meloxicam, metoprolol succinate, predniSONE, simvastatin, and valsartan-hydrochlorothiazide  History Review  Allergy: Brandon Bright is allergic to codeine, vicodin [hydrocodone-acetaminophen], and celebrex [celecoxib]. Drug: Brandon Bright  reports no history of drug use. Alcohol:  reports no history of alcohol use. Tobacco:  reports that he has never smoked. He has never used smokeless tobacco. Social: Brandon Bright  reports that he has never smoked. He has never used smokeless tobacco. He reports that he does not drink alcohol and does not use drugs. 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 Surgical: Brandon Bright  has a past surgical history that includes ganglion cyst removal (Right); Cataract extraction w/PHACO (Left, 02/04/2016); Colonoscopy; Pilonidal cyst excision; Cardiac catheterization (1999?); Cataract extraction w/PHACO (Right, 03/03/2016); Eye surgery (May and June); and Colonoscopy with propofol (N/A, 06/05/2018). Family: family history includes Dementia in his mother; Diabetes in his brother; Heart disease in his brother, brother, and father.  Laboratory Chemistry Profile   Renal Lab Results  Component Value Date   BUN 16 08/17/2014   CREATININE 1.10 10/25/2018   GFRAA >60 08/17/2014   GFRNONAA >60 08/17/2014     Hepatic Lab Results  Component Value Date   AST 15 08/17/2014   ALT 20 08/17/2014   ALBUMIN 2.7 (L) 08/17/2014   ALKPHOS 43 (L) 08/17/2014   LIPASE  75 08/16/2014     Electrolytes Lab Results  Component Value Date   NA 144 08/17/2014   K 3.3 (L) 08/17/2014   CL 111 (H) 08/17/2014   CALCIUM 7.5 (L) 08/17/2014   MG 1.7 (L) 08/16/2014     Bone No  results found for: VD25OH, [id number], QJ1941DE0, CX4481EH6, 25OHVITD1, 25OHVITD2, 25OHVITD3, TESTOFREE, TESTOSTERONE   Inflammation (CRP: Acute Phase) (ESR: Chronic Phase) No results found for: CRP, ESRSEDRATE, LATICACIDVEN     Note: Above Lab results reviewed.  Recent Imaging Review  MR BRAIN W WO CONTRAST CLINICAL DATA:  CSF rhinorrhea.  Possible sinus drainage.  EXAM: MRI HEAD WITHOUT AND WITH CONTRAST  TECHNIQUE: Multiplanar, multiecho pulse sequences of the brain and surrounding structures were obtained without and with intravenous contrast.  CONTRAST:  10 mL Gadavist  COMPARISON:  None.  FINDINGS: Brain: No acute infarct, hemorrhage, or mass lesion is present. Moderate atrophy and white matter disease is present bilaterally.  The ventricles are of proportionate to the degree of atrophy. No significant extraaxial fluid collection is present.  The internal auditory canals are within normal limits. 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 Brainstem and cerebellum are otherwise normal.  Postcontrast images demonstrate no pathologic enhancement.  The cribriform plate extends inferiorly right-greater-than-left slight asymmetry. Fovea ethmoidalis measures 6 mm on the right and 8 mm on the left. This increases possibility of a CSF leak.  Vascular: Flow is present in the major intracranial arteries.  Skull and upper cervical spine: Skull base is otherwise unremarkable. Craniocervical junction is normal.  Sinuses/Orbits: No fluid levels are present. Small polyps or mucous retention cysts are present within the right maxillary sinus. Mastoid air cells are clear.  IMPRESSION: 1. 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 2. No fluid in the sinuses to suggest active CSF leak. 3. Moderate atrophy and white matter disease bilaterally likely reflects the sequela of chronic  microvascular ischemia 4. No acute intracranial abnormality.  Electronically Signed   By: San Morelle M.D.   On: 10/25/2018 11:58 Note: Reviewed        Physical Exam  General appearance: Well nourished, well developed, and well hydrated. In no apparent acute distress Mental status: Alert, oriented x 3 (person, place, & time)       Respiratory: No evidence of acute respiratory distress Eyes: PERLA Vitals: BP (!) 142/84 (BP Location: Right Arm, Patient Position: Sitting, Cuff Size: Large)    Pulse 88    Temp (!) 97.5 F (36.4 C) (Temporal)    Resp 16    Ht $R'5\' 10"'WH$  (1.778 m)    Wt 226 lb (102.5 kg)    SpO2 96%    BMI 32.43 kg/m  BMI: Estimated body mass index is 32.43 kg/m as calculated from the following:   Height as of this encounter: $RemoveBeforeD'5\' 10"'HWiTeUIIVjUxxb$  (1.778 m).   Weight as of this encounter: 226 lb (102.5 kg). Ideal: Ideal body weight: 73 kg (160 lb 15 oz) Adjusted ideal body weight: 84.8 kg (186 lb 15.4 oz)  Assessment   Status Diagnosis  Controlled Controlled Controlled 1. Chronic hip pain (Left)   2. Arthralgia of hip (Left)   3. Osteoarthritis of hip (Left)      Updated Problems: Problem  Chronic hip pain (Left)  Arthralgia of hip (Left)  Osteoarthritis of hip (Left)  Cholelithiasis   Overview:  Noted on CT  Formatting of this note might be different from  the original. Noted on CT   Type 2 Diabetes Mellitus Without Complication, Without Long-Term Current Use of Insulin (Hcc)  Vitamin D Deficiency    Plan of Care  Problem-specific:  No problem-specific Assessment & Plan notes found for this encounter.  Brandon Bright has a current medication list which includes the following long-term medication(s): amlodipine, fluticasone, gabapentin, hypertonic nasal wash, simvastatin, valsartan-hydrochlorothiazide, and metoprolol succinate.  Pharmacotherapy (Medications Ordered): Meds ordered this encounter  Medications   predniSONE (DELTASONE) 20 MG tablet    Sig:  Take 3 tablets (60 mg total) by mouth daily with breakfast for 3 days, THEN 2 tablets (40 mg total) daily with breakfast for 3 days, THEN 1 tablet (20 mg total) daily with breakfast for 3 days.    Dispense:  18 tablet    Refill:  0   Orders:  Orders Placed This Encounter  Procedures   HIP INJECTION    Standing Status:   Future    Standing Expiration Date:   11/26/2020    Scheduling Instructions:     Side: Left-sided     Sedation: No Sedation.     Timeframe: 2 weeks   DG HIP UNILAT W OR W/O PELVIS 2-3 VIEWS LEFT    Please describe any evidence of DJD, such as joint narrowing, asymmetry, cysts, or any anomalies in bone density, production, or erosion.    Standing Status:   Future    Number of Occurrences:   1    Standing Expiration Date:   09/28/2020    Scheduling Instructions:     Imaging must be done as soon as possible. Inform patient that order will expire within 30 days and I will not renew it.    Order Specific Question:   Reason for Exam (SYMPTOM  OR DIAGNOSIS REQUIRED)    Answer:   Left hip pain/arthralgia    Order Specific Question:   Preferred imaging location?    Answer:   [facility]    Order Specific Question:   Call Results- Best Contact Number?    Answer:   [phone] ([facility])   Sedimentation rate    Indication: Disorder of skeletal system (M89.9)    Order Specific Question:   CC Results    Answer:   PCP-NURSE [[id number]]    Order Specific Question:   Release to patient    Answer:   Immediate   C-reactive protein    Indication: Problems influencing health status (Z78.9)    Order Specific Question:   CC Results    Answer:   PCP-NURSE [[id number]]    Order Specific Question:   Release to patient    Answer:   Immediate   Follow-up plan:   Return in about 2 weeks (around 09/11/2020) for Procedure (no sedation): (L) IA Hip inj. #1.      Interventional management options: Planned, scheduled, and/or pending:   None at this time    Considering:    Diagnostic right L3 TFESI  Diagnostic right L4 TFESI  Diagnostic bilateral lumbar facet block  Possible bilateral lumbar facet RFA  Diagnostic Trigger point injection  Diagnostic  bilateral cervical facet nerve block  Possible bilateral cervical facet RFA    Palliative PRN treatment(s):   Palliative right L4-5 LESI (none done by me) The patient indicated having had LESI x 4, by Dr. Primus Bravo. (before 2000) (Procedure described as helpful).        Recent Visits Date Type Provider Dept  06/30/20 Telemedicine Milinda Pointer, [facility]  Showing recent visits within past 90 days and meeting all other requirements Today's Visits Date Type Provider Dept  08/28/20 Office Visit Milinda Pointer, MD [facility]  Showing today's visits and meeting all other requirements Future Appointments No visits were found meeting these conditions. Showing future appointments within next 90 days and meeting all other requirements  I discussed the assessment and treatment plan with the patient. The patient was provided an opportunity to ask questions and all were answered. The patient agreed with the plan and demonstrated an understanding of the instructions.  Patient advised to call back or seek an in-person evaluation if the symptoms or condition worsens.  Duration of encounter: 30 minutes.  Note by: Gaspar Cola, MD Date: 08/28/2020; Time: 1:04 PM

## 2020-08-29 LAB — SEDIMENTATION RATE: Sed Rate: 26 mm/hr (ref 0–30)

## 2020-08-29 LAB — C-REACTIVE PROTEIN: CRP: 2 mg/L (ref 0–10)

## 2020-09-07 NOTE — Progress Notes (Signed)
PROVIDER NOTE: Information contained herein reflects review and annotations entered in association with encounter. Interpretation of such information and data should be left to medically-trained personnel. Information provided to patient can be located elsewhere in the medical record under "Patient Instructions". Document created using STT-dictation technology, any transcriptional errors that may result from process are unintentional.    Patient: Brandon Bright  Service Category: Procedure  Provider: Oswaldo Done, MD  DOB: 1946-05-05  DOS: 09/09/2020  Location: [facility]  MRN: [id number]  Setting: Ambulatory - outpatient  Referring Provider: Delano Metz, MD  Type: Established Patient  Specialty: Interventional Pain Management  PCP: Wilford Corner, PA-C   Primary Reason for Visit: Interventional Pain Management Treatment. CC: Hip Pain (left)  Procedure #1:  Anesthesia, Analgesia, Anxiolysis:  Type: Intra-Articular Hip Injection #1  Primary Purpose: Diagnostic Region: Posterolateral hip joint area. Level: Lower pelvic and hip joint level. Target Area: Superior aspect of the hip joint cavity, going thru the superior portion of the capsular ligament. Approach: Posterolateral approach. Laterality: Left  Type: Local Anesthesia Indication(s): Analgesia         Route: Infiltration ([place]/IM) IV Access: Declined Sedation: Declined  Local Anesthetic: Lidocaine 1-2%  Position: Lateral Decubitus with bad side up Area Prepped: Entire Posterolateral hip area. DuraPrep (Iodine Povacrylex [0.7% available iodine] and Isopropyl Alcohol, 74% w/w)   Indications: 1. Chronic hip pain (Left)   2. Osteoarthritis of hip (Left)   3. Enthesopathy of hip region (Left)   4. Other bursitis of hip (Left)   5. Arthralgia of hip (Left)    Pain Score: Pre-procedure: 8 /10 Post-procedure: 8 /10   Pre-op H&P Assessment:  Brandon Bright is a 74 y.o. (year old), male patient, seen  today for interventional treatment. He  has a past surgical history that includes ganglion cyst removal (Right); Cataract extraction w/PHACO (Left, 02/04/2016); Colonoscopy; Pilonidal cyst excision; Cardiac catheterization (1999?); Cataract extraction w/PHACO (Right, 03/03/2016); Eye surgery (May and June); and Colonoscopy with propofol (N/A, 06/05/2018). Brandon Bright has a current medication list which includes the following prescription(s): amlodipine, aspirin, cholecalciferol, fluticasone, gabapentin, hypertonic nasal wash, latanoprost, melatonin, centrum silver 50+men, multiple vitamins-minerals, simvastatin, valsartan-hydrochlorothiazide, meloxicam, and metoprolol succinate. His primarily concern today is the Hip Pain (left)  Initial Vital Signs:  Pulse/HCG Rate: 91  Temp: 97.8 F (36.6 C) Resp: 18 BP: 138/83 SpO2: 94 %  BMI: Estimated body mass index is 31.57 kg/m as calculated from the following:   Height as of this encounter: 5\' 10"  (1.778 m).   Weight as of this encounter: 220 lb (99.8 kg).  Risk Assessment: Allergies: Reviewed. He is allergic to codeine, vicodin [hydrocodone-acetaminophen], celebrex [celecoxib], and ultram [tramadol].  Allergy Precautions: None required Coagulopathies: Reviewed. None identified.  Blood-thinner therapy: None at this time Active Infection(s): Reviewed. None identified. Brandon Bright is afebrile  Site Confirmation: Brandon Bright was asked to confirm the procedure and laterality before marking the site Procedure checklist: Completed Consent: Before the procedure and under the influence of no sedative(s), amnesic(s), or anxiolytics, the patient was informed of the treatment options, risks and possible complications. To fulfill our ethical and legal obligations, as recommended by the American Medical Association's Code of Ethics, I have informed the patient of my clinical impression; the nature and purpose of the treatment or procedure; the risks, benefits, and  possible complications of the intervention; the alternatives, including doing nothing; the risk(s) and benefit(s) of the alternative treatment(s) or procedure(s); and the risk(s) and benefit(s) of doing nothing. The  patient was provided information about the general risks and possible complications associated with the procedure. These may include, but are not limited to: failure to achieve desired goals, infection, bleeding, organ or nerve damage, allergic reactions, paralysis, and death. In addition, the patient was informed of those risks and complications associated to the procedure, such as failure to decrease pain; infection; bleeding; organ or nerve damage with subsequent damage to sensory, motor, and/or autonomic systems, resulting in permanent pain, numbness, and/or weakness of one or several areas of the body; allergic reactions; (i.e.: anaphylactic reaction); and/or death. Furthermore, the patient was informed of those risks and complications associated with the medications. These include, but are not limited to: allergic reactions (i.e.: anaphylactic or anaphylactoid reaction(s)); adrenal axis suppression; blood sugar elevation that in diabetics may result in ketoacidosis or comma; water retention that in patients with history of congestive heart failure may result in shortness of breath, pulmonary edema, and decompensation with resultant heart failure; weight gain; swelling or edema; medication-induced neural toxicity; particulate matter embolism and blood vessel occlusion with resultant organ, and/or nervous system infarction; and/or aseptic necrosis of one or more joints. Finally, the patient was informed that Medicine is not an exact science; therefore, there is also the possibility of unforeseen or unpredictable risks and/or possible complications that may result in a catastrophic outcome. The patient indicated having understood very clearly. We have given the patient no guarantees and we have  made no promises. Enough time was given to the patient to ask questions, all of which were answered to the patient's satisfaction. Brandon Bright has indicated that he wanted to continue with the procedure. Attestation: I, the ordering provider, attest that I have discussed with the patient the benefits, risks, side-effects, alternatives, likelihood of achieving goals, and potential problems during recovery for the procedure that I have provided informed consent. Date  Time: 09/09/2020  8:03 AM  Pre-Procedure Preparation:  Monitoring: As per clinic protocol. Respiration, ETCO2, SpO2, BP, heart rate and rhythm monitor placed and checked for adequate function Safety Precautions: Patient was assessed for positional comfort and pressure points before starting the procedure. Time-out: I initiated and conducted the "Time-out" before starting the procedure, as per protocol. The patient was asked to participate by confirming the accuracy of the "Time Out" information. Verification of the correct person, site, and procedure were performed and confirmed by me, the nursing staff, and the patient. "Time-out" conducted as per Joint Commission's Universal Protocol (UP.01.01.01). Time: 0824  Description of Procedure #1:  Safety Precautions: Aspiration looking for blood return was conducted prior to all injections. At no point did we inject any substances, as a needle was being advanced. No attempts were made at seeking any paresthesias. Safe injection practices and needle disposal techniques used. Medications properly checked for expiration dates. SDV (single dose vial) medications used. Description of the Procedure: Protocol guidelines were followed. The patient was placed in position over the fluoroscopy table. The target area was identified and the area prepped in the usual manner. Skin & deeper tissues infiltrated with local anesthetic. Appropriate amount of time allowed to pass for local anesthetics to take effect.  The procedure needles were then advanced to the target area. Proper needle placement secured. Negative aspiration confirmed. Solution injected in intermittent fashion, asking for systemic symptoms every 0.5cc of injectate. The needles were then removed and the area cleansed, making sure to leave some of the prepping solution back to take advantage of its long term bactericidal properties.  Start Time: 0825 hrs. Materials:  Needle(s) Type: Spinal Needle Gauge: 22G Length: 7-in Medication(s): Please see orders for medications and dosing details.       Imaging Guidance (Non-Spinal) for procedure #1:  Type of Imaging Technique: Fluoroscopy Guidance (Non-Spinal) Indication(s): Assistance in needle guidance and placement for procedures requiring needle placement in or near specific anatomical locations not easily accessible without such assistance. Exposure Time: Please see nurses notes. Contrast: Before injecting any contrast, we confirmed that the patient did not have an allergy to iodine, shellfish, or radiological contrast. Once satisfactory needle placement was completed at the desired level, radiological contrast was injected. Contrast injected under live fluoroscopy. No contrast complications. See chart for type and volume of contrast used. Fluoroscopic Guidance: I was personally present during the use of fluoroscopy. "Tunnel Vision Technique" used to obtain the best possible view of the target area. Parallax error corrected before commencing the procedure. "Direction-depth-direction" technique used to introduce the needle under continuous pulsed fluoroscopy. Once target was reached, antero-posterior, oblique, and lateral fluoroscopic projection used confirm needle placement in all planes. Images permanently stored in EMR. Interpretation: I personally interpreted the imaging intraoperatively. Adequate needle placement confirmed in multiple planes. Appropriate spread of contrast into desired area  was observed. No evidence of afferent or efferent intravascular uptake. Permanent images saved into the patient's record.  Vitals:   09/09/20 0802 09/09/20 0820 09/09/20 0830 09/09/20 0835  BP: 138/83 138/82 139/76 139/79  Pulse: 91 97 89 88  Resp: 18 16 16 16   Temp: 97.8 F (36.6 C)     SpO2: 94% 97% 98% 99%  Weight: 220 lb (99.8 kg)     Height: 5\' 10"  (1.778 m)        End Time: 0830 hrs.Materials:  Needle(s) Type: Spinal Needle Gauge: 22G Length: 5.0-in Medication(s): Please see orders for medications and dosing details.  Antibiotic Prophylaxis:   Anti-infectives (From admission, onward)   None     Indication(s): None identified  Post-operative Assessment:  Post-procedure Vital Signs:  Pulse/HCG Rate: 88 (nsr)  Temp: 97.8 F (36.6 C) Resp: 16 BP: 139/79 SpO2: 99 %  EBL: None  Complications: No immediate post-treatment complications observed by team, or reported by patient.  Note: The patient tolerated the entire procedure well. A repeat set of vitals were taken after the procedure and the patient was kept under observation following institutional policy, for this type of procedure. Post-procedural neurological assessment was performed, showing return to baseline, prior to discharge. The patient was provided with post-procedure discharge instructions, including a section on how to identify potential problems. Should any problems arise concerning this procedure, the patient was given instructions to immediately contact , at any time, without hesitation. In any case, we plan to contact the patient by telephone for a follow-up status report regarding this interventional procedure.  Comments:  No additional relevant information.  Plan of Care  Orders:  Orders Placed This Encounter  Procedures  . HIP INJECTION    Scheduling Instructions:     Side: Left-sided     Sedation: Patient's choice.     Timeframe: Today  . DG PAIN CLINIC C-ARM 1-60 MIN NO REPORT     Intraoperative interpretation by procedural physician at [facility].    Standing Status:   Standing    Number of Occurrences:   1    Order Specific Question:   Reason for exam:    Answer:   Assistance in needle guidance and placement for procedures requiring needle placement in or near specific anatomical locations not easily  accessible without such assistance.  . Informed Consent Details: Physician/Practitioner Attestation; Transcribe to consent form and obtain patient signature    Nursing Order: Transcribe to consent form and obtain patient signature. Note: Always confirm laterality of pain with Mr. Maude LericheWhitlow, before procedure.    Order Specific Question:   Physician/Practitioner attestation of informed consent for procedure/surgical case    Answer:   I, the physician/practitioner, attest that I have discussed with the patient the benefits, risks, side effects, alternatives, likelihood of achieving goals and potential problems during recovery for the procedure that I have provided informed consent.    Order Specific Question:   Procedure    Answer:   Hip injection    Order Specific Question:   Physician/Practitioner performing the procedure    Answer:   Sefora Tietje A. Laban EmperorNaveira, MD    Order Specific Question:   Indication/Reason    Answer:   Hip Joint Pain (Arthralgia)  . Provide equipment / supplies at bedside    "Block Tray" (Disposable  single use) Needle type: SpinalSpinal Amount/quantity: 1 Size: Long (7-inch) Gauge: 22G    Standing Status:   Standing    Number of Occurrences:   1    Order Specific Question:   Specify    Answer:   Block Tray   Chronic Opioid Analgesic:  No opioid analgesics prescribed by our practice.   Medications ordered for procedure: Meds ordered this encounter  Medications  . iohexol (OMNIPAQUE) 180 MG/ML injection 10 mL    Must be Myelogram-compatible. If not available, you may substitute with a water-soluble, non-ionic, hypoallergenic,  myelogram-compatible radiological contrast medium.  . methylPREDNISolone acetate (DEPO-MEDROL) injection 80 mg  . ropivacaine (PF) 2 mg/mL (0.2%) (NAROPIN) injection 9 mL  . lidocaine (XYLOCAINE) 2 % (with pres) injection 400 mg   Medications administered: We administered iohexol, methylPREDNISolone acetate, ropivacaine (PF) 2 mg/mL (0.2%), and lidocaine.  See the medical record for exact dosing, route, and time of administration.  Follow-up plan:   Return in about 2 weeks (around 09/23/2020) for (F2F), (PP) Follow-up.       Interventional management options: Planned, scheduled, and/or pending:   None at this time    Considering:   Diagnostic right L3 TFESI  Diagnostic right L4 TFESI  Diagnostic bilateral lumbar facet block  Possible bilateral lumbar facet RFA  Diagnostic Trigger point injection  Diagnostic  bilateral cervical facet nerve block  Possible bilateral cervical facet RFA    Palliative PRN treatment(s):   Palliative right L4-5 LESI (none done by me) The patient indicated having had LESI x 4, by Dr. Metta Clinesrisp. (before 2000) (Procedure described as helpful).      Recent Visits Date Type Provider Dept  08/28/20 Office Visit Delano MetzNaveira, Tavonna Worthington, MD [facility]  06/30/20 Telemedicine Delano MetzNaveira, Cristan Hout, MD [facility]  Showing recent visits within past 90 days and meeting all other requirements Today's Visits Date Type Provider Dept  09/09/20 Procedure visit Delano MetzNaveira, Khiyan Crace, MD [facility]  Showing today's visits and meeting all other requirements Future Appointments Date Type Provider Dept  09/24/20 Appointment Delano MetzNaveira, Jayle Solarz, MD [facility]  Showing future appointments within next 90 days and meeting all other requirements  Disposition: Discharge home  Discharge (Date  Time): 09/09/2020; 0843 hrs.   Primary Care Physician: Wilford CornerWhitaker, Jason Hestle, PA-C [facility] Note by:  Oswaldo DoneFrancisco A Christion Leonhard, MD Date: 09/09/2020; Time: 9:24 AM  Disclaimer:  Medicine is not an Visual merchandiserexact science. The only guarantee in medicine is that nothing  is guaranteed. It is important to note that the decision to proceed with this intervention was based on the information collected from the patient. The Data and conclusions were drawn from the patient's questionnaire, the interview, and the physical examination. Because the information was provided in large part by the patient, it cannot be guaranteed that it has not been purposely or unconsciously manipulated. Every effort has been made to obtain as much relevant data as possible for this evaluation. It is important to note that the conclusions that lead to this procedure are derived in large part from the available data. Always take into account that the treatment will also be dependent on availability of resources and existing treatment guidelines, considered by other Pain Management Practitioners as being common knowledge and practice, at the time of the intervention. For Medico-Legal purposes, it is also important to point out that variation in procedural techniques and pharmacological choices are the acceptable norm. The indications, contraindications, technique, and results of the above procedure should only be interpreted and judged by a Board-Certified Interventional Pain Specialist with extensive familiarity and expertise in the same exact procedure and technique.

## 2020-09-09 ENCOUNTER — Ambulatory Visit
Admission: RE | Admit: 2020-09-09 | Discharge: 2020-09-09 | Disposition: A | Discharge status: Home / Self Care | Payer: Medicare PPO | Source: Ambulatory Visit | Attending: Pain Medicine | Admitting: Pain Medicine

## 2020-09-09 ENCOUNTER — Other Ambulatory Visit: Payer: Self-pay

## 2020-09-09 ENCOUNTER — Ambulatory Visit (HOSPITAL_BASED_OUTPATIENT_CLINIC_OR_DEPARTMENT_OTHER): Payer: Medicare PPO | Admitting: Pain Medicine

## 2020-09-09 ENCOUNTER — Encounter: Payer: Self-pay | Admitting: Pain Medicine

## 2020-09-09 VITALS — BP 139/79 | HR 88 | Temp 97.8°F | Resp 16 | Ht 70.0 in | Wt 220.0 lb

## 2020-09-09 DIAGNOSIS — M1612 Unilateral primary osteoarthritis, left hip: Secondary | ICD-10-CM

## 2020-09-09 DIAGNOSIS — M76892 Other specified enthesopathies of left lower limb, excluding foot: Secondary | ICD-10-CM | POA: Insufficient documentation

## 2020-09-09 DIAGNOSIS — G8929 Other chronic pain: Secondary | ICD-10-CM | POA: Insufficient documentation

## 2020-09-09 DIAGNOSIS — M7072 Other bursitis of hip, left hip: Secondary | ICD-10-CM

## 2020-09-09 DIAGNOSIS — M25552 Pain in left hip: Secondary | ICD-10-CM | POA: Insufficient documentation

## 2020-09-09 MED ORDER — ROPIVACAINE HCL 2 MG/ML IJ SOLN
9.0000 mL | Freq: Once | INTRAMUSCULAR | Status: AC
  Administered 2020-09-09: 9 mL via INTRA_ARTICULAR

## 2020-09-09 MED ORDER — METHYLPREDNISOLONE ACETATE 80 MG/ML IJ SUSP
INTRAMUSCULAR | Status: AC
  Filled 2020-09-09: qty 1

## 2020-09-09 MED ORDER — IOHEXOL 180 MG/ML  SOLN
10.0000 mL | Freq: Once | INTRAMUSCULAR | Status: AC
  Administered 2020-09-09: 10 mL via INTRA_ARTICULAR

## 2020-09-09 MED ORDER — LIDOCAINE HCL 2 % IJ SOLN
20.0000 mL | Freq: Once | INTRAMUSCULAR | Status: AC
  Administered 2020-09-09: 09:00:00 400 mg

## 2020-09-09 MED ORDER — LIDOCAINE HCL 2 % IJ SOLN
INTRAMUSCULAR | Status: AC
  Filled 2020-09-09: qty 20

## 2020-09-09 MED ORDER — METHYLPREDNISOLONE ACETATE 80 MG/ML IJ SUSP
80.0000 mg | Freq: Once | INTRAMUSCULAR | Status: AC
  Administered 2020-09-09: 80 mg via INTRA_ARTICULAR

## 2020-09-09 MED ORDER — ROPIVACAINE HCL 2 MG/ML IJ SOLN
INTRAMUSCULAR | Status: AC
  Filled 2020-09-09: qty 10

## 2020-09-09 NOTE — Progress Notes (Signed)
Safety precautions to be maintained throughout the outpatient stay will include: orient to surroundings, keep bed in low position, maintain call bell within reach at all times, provide assistance with transfer out of bed and ambulation.  

## 2020-09-09 NOTE — Patient Instructions (Signed)
____________________________________________________________________________________________  Post-Procedure Discharge Instructions  Instructions:  Apply ice:   Purpose: This will minimize any swelling and discomfort after procedure.   When: Day of procedure, as soon as you get home.  How: Fill a plastic sandwich bag with crushed ice. Cover it with a small towel and apply to injection site.  How long: (15 min on, 15 min off) Apply for 15 minutes then remove x 15 minutes.  Repeat sequence on day of procedure, until you go to bed.  Apply heat:   Purpose: To treat any soreness and discomfort from the procedure.  When: Starting the next day after the procedure.  How: Apply heat to procedure site starting the day following the procedure.  How long: May continue to repeat daily, until discomfort goes away.  Food intake: Start with clear liquids (like water) and advance to regular food, as tolerated.   Physical activities: Keep activities to a minimum for the first 8 hours after the procedure. After that, then as tolerated.  Driving: If you have received any sedation, be responsible and do not drive. You are not allowed to drive for 24 hours after having sedation.  Blood thinner: (Applies only to those taking blood thinners) You may restart your blood thinner 6 hours after your procedure.  Insulin: (Applies only to Diabetic patients taking insulin) As soon as you can eat, you may resume your normal dosing schedule.  Infection prevention: Keep procedure site clean and dry. Shower daily and clean area with soap and water.  Post-procedure Pain Diary: Extremely important that this be done correctly and accurately. Recorded information will be used to determine the next step in treatment. For the purpose of accuracy, follow these rules:  Evaluate only the area treated. Do not report or include pain from an untreated area. For the purpose of this evaluation, ignore all other areas of pain,  except for the treated area.  After your procedure, avoid taking a long nap and attempting to complete the pain diary after you wake up. Instead, set your alarm clock to go off every hour, on the hour, for the initial 8 hours after the procedure. Document the duration of the numbing medicine, and the relief you are getting from it.  Do not go to sleep and attempt to complete it later. It will not be accurate. If you received sedation, it is likely that you were given a medication that may cause amnesia. Because of this, completing the diary at a later time may cause the information to be inaccurate. This information is needed to plan your care.  Follow-up appointment: Keep your post-procedure follow-up evaluation appointment after the procedure (usually 2 weeks for most procedures, 6 weeks for radiofrequencies). DO NOT FORGET to bring you pain diary with you.   Expect: (What should I expect to see with my procedure?)  From numbing medicine (AKA: Local Anesthetics): Numbness or decrease in pain. You may also experience some weakness, which if present, could last for the duration of the local anesthetic.  Onset: Full effect within 15 minutes of injected.  Duration: It will depend on the type of local anesthetic used. On the average, 1 to 8 hours.   From steroids (Applies only if steroids were used): Decrease in swelling or inflammation. Once inflammation is improved, relief of the pain will follow.  Onset of benefits: Depends on the amount of swelling present. The more swelling, the longer it will take for the benefits to be seen. In some cases, up to 10 days.    Duration: Steroids will stay in the system x 2 weeks. Duration of benefits will depend on multiple posibilities including persistent irritating factors.  Side-effects: If present, they may typically last 2 weeks (the duration of the steroids).  Frequent: Cramps (if they occur, drink Gatorade and take over-the-counter Magnesium 450-500 mg  once to twice a day); water retention with temporary weight gain; increases in blood sugar; decreased immune system response; increased appetite.  Occasional: Facial flushing (red, warm cheeks); mood swings; menstrual changes.  Uncommon: Long-term decrease or suppression of natural hormones; bone thinning. (These are more common with higher doses or more frequent use. This is why we prefer that our patients avoid having any injection therapies in other practices.)   Very Rare: Severe mood changes; psychosis; aseptic necrosis.  From procedure: Some discomfort is to be expected once the numbing medicine wears off. This should be minimal if ice and heat are applied as instructed.  Call if: (When should I call?)  You experience numbness and weakness that gets worse with time, as opposed to wearing off.  New onset bowel or bladder incontinence. (Applies only to procedures done in the spine)  Emergency Numbers:  Durning business hours (Monday - Thursday, 8:00 AM - 4:00 PM) (Friday, 9:00 AM - 12:00 Noon): [phone]  After hours: [phone]  NOTE: If you are having a problem and are unable connect with, or to talk to a provider, then go to your nearest urgent care or emergency department. If the problem is serious and urgent, please call 911. ____________________________________________________________________________________________   ____________________________________________________________________________________________  Virtual Visits   Eligibility In order to be eligible for "Virtual Visit Encounters", you must be available over the phone. It is the patient's responsibility to make sure we have a way to contact you.  We understand how people are reluctant to pickup on "unknown" calls, therefore, we suggest adding our telephone numbers to your list of "CONTACT(s)". This way, you should be able to readily identify our calls when you receive them.  When will this type of  visits be used? The decision will be made on a case by case basis.  At what time will I be called? This is an excellent question. The providers will try to call you during the day, whenever they have spare time. For the purpose of the day's schedule, you are assigned a time for your appointment, however, the call may come anytime during the day. We need you to be available on a moment's notice. If you are unable to do this, then request an "in-person" appointment rather than a "virtual visit".  Can I request my medication visits to be "Virtual"? Yes you may request it, but the decision is entirely up to the healthcare provider. Control substances require specific monitoring that requires Face-to-Face encounters. The number of encounters  and the extent of the monitoring is determined on a case by case basis.  Add a new contact to your smart phone and label it "PAIN CLINIC" Under this contact add the following numbers: Main: [phone] (Official Contact Number) Nurses: [phone] (These are outgoing only calling systems. Do not call this number.) Dr. Dason Mosley: [phone] or [phone] (Outgoing calls only. Do not call this number.)  ____________________________________________________________________________________________    

## 2020-09-10 ENCOUNTER — Telehealth: Payer: Self-pay

## 2020-09-10 NOTE — Telephone Encounter (Signed)
Post procedure phone call.  Patinet states he is doing well.   °

## 2020-09-23 NOTE — Progress Notes (Signed)
PROVIDER NOTE: Information contained herein reflects review and annotations entered in association with encounter. Interpretation of such information and data should be left to medically-trained personnel. Information provided to patient can be located elsewhere in the medical record under "Patient Instructions". Document created using STT-dictation technology, any transcriptional errors that may result from process are unintentional.    Patient: Brandon Bright  Service Category: E/M  Provider: Gaspar Cola, MD  DOB: 10-01-1945  DOS: 09/24/2020  Specialty: Interventional Pain Management  MRN: [id number]  Setting: Ambulatory outpatient  PCP: Donnamarie Rossetti, PA-C  Type: Established Patient    Referring Provider: Donnamarie Rossetti,*  Location: Office  Delivery: Face-to-face     HPI  [name], a 74 y.o. year old male, is here today because of his Chronic pain syndrome [G89.4]. Mr. Turney primary complain today is Hip Pain (left) Last encounter: My last encounter with him was on 09/09/2020. Pertinent problems: Mr. 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 Pain Assessment: Severity of Chronic pain  is reported as a 0-No pain/10. Location: Hip Left/denies. Onset: More than a month ago. Quality:  (no pain at this time). Timing: Other (Comment) (has not had pain since injection). Modifying factor(s):  Marland Kitchen Vitals:  height is '5\' 10"'  (1.778 m) and weight is 220 lb (99.8 kg). His temperature is 98.3 F (36.8 C). His blood pressure is 146/85 (abnormal) and his pulse is 82. His respiration is 18 and oxygen saturation is 96%.   Reason for encounter: post-procedure assessment.  Patient with left hip pain and a negative left hip diagnostic x-ray suggesting either an enthesopathy (soft tissue problem) versus problems coming from the lumbar spine.  The patient's last lumbar MRI was done on 04/14/2009 by order of Dr. Kary Kos.  By 2010 the patient already had MRI evidence of an L3-4 broad-based disc bulge with flattening of the ventral thecal sac, moderate bilateral facet arthropathy, moderate central canal stenosis, and severe right foraminal narrowing.  At the L4-5 level there was evidence of broad-based disc bulge with right paracentral disc component abutting the extraforaminal right L4 nerve root, severe right foraminal narrowing, bilateral facet arthropathy with ligamentum flavum infolding and resulting spinal stenosis.  At the L5-S1 there was a moderate size central disc protrusion with impressing on the ventral thecal sac.  However, upon return after having had a diagnostic left IA hip joint injection, the patient has indicated having 100% relief of the pain for the duration of the local anesthetic which as of the time of this evaluation was still ongoing.  This would suggest that the patient's condition was secondary to problems with his left hip.  Because his left hip x-ray was completely negative, this would suggest that we are dealing with a soft tissue problem (enthesopathy), which may require an MRI, should the pain return again.  Patient was  informed of my interpretation of these results.  Today we had a  very long conversation regarding the mechanism sustaining his pain which is likely to be inflammatory.  We talked about using Preemptive Analgesia as a form of prevention and also to consider doing some research into inflammatory factors and perhaps looking into an anti-inflammatory diet.  I will see him back on a as needed basis.  Should the left hip pain return, then we will need to do an MRI of that left hip since it is likely that were dealing with soft tissue problems.  The plan was communicated to the patient who understood and accepted.  Post-Procedure Evaluation  Procedure (09/09/2020): Diagnostic left IA hip injection #1 under fluoroscopic guidance, no sedation Pre-procedure pain level: 8/10 Post-procedure: 8/10 No initial benefit, possibly due to rapid discharge after no sedation procedure, without enough time to allow full onset of block.  Sedation: None.  Effectiveness during initial hour after procedure(Ultra-Short Term Relief): 100 %.  Local anesthetic used: Long-acting (4-6 hours) Effectiveness: Defined as any analgesic benefit obtained secondary to the administration of local anesthetics. This carries significant diagnostic value as to the etiological location, or anatomical origin, of the pain. Duration of benefit is expected to coincide with the duration of the local anesthetic used.  Effectiveness during initial 4-6 hours after procedure(Short-Term Relief): 100 %.  Long-term benefit: Defined as any relief past the pharmacologic duration of the local anesthetics.  Effectiveness past the initial 6 hours after procedure(Long-Term Relief): 100 %.  Current benefits: Defined as benefit that persist at this time.   Analgesia:  90-100% better Function: Mr. Mccaslin reports improvement in function ROM: Mr. Shams reports improvement in ROM  Pharmacotherapy Assessment   Analgesic: No opioid analgesics prescribed by our practice.   Monitoring: [place] PMP: PDMP reviewed during this  encounter.       Pharmacotherapy: No side-effects or adverse reactions reported. Compliance: No problems identified. Effectiveness: Clinically acceptable.  Dewayne Shorter, RN  09/24/2020  8:07 AM  Signed Safety precautions to be maintained throughout the outpatient stay will include: orient to surroundings, keep bed in low position, maintain call bell within reach at all times, provide assistance with transfer out of bed and ambulation.    UDS: No results found for: SUMMARY   ROS  Constitutional: Denies any fever or chills Gastrointestinal: No reported hemesis, hematochezia, vomiting, or acute GI distress Musculoskeletal: Denies any acute onset joint swelling, redness, loss of ROM, or weakness Neurological: No reported episodes of acute onset apraxia, aphasia, dysarthria, agnosia, amnesia, paralysis, loss of coordination, or loss of consciousness  Medication Review  Centrum Silver 50+Men, Hypertonic Nasal Wash, Melatonin, Multiple Vitamins-Minerals, amLODipine, cholecalciferol, fluticasone, gabapentin, latanoprost, simvastatin, and valsartan-hydrochlorothiazide  History Review  Allergy: Mr. Houseworth is allergic to codeine, vicodin [hydrocodone-acetaminophen], celebrex [celecoxib], and ultram [tramadol]. Drug: Mr. Vaquera  reports no history of drug use. Alcohol:  reports no history of alcohol use. Tobacco:  reports that he has never smoked. He has never used smokeless tobacco. Social: Mr. Frieden  reports that he has never smoked. He has never used smokeless tobacco. He reports that he does not drink alcohol and does not use drugs. 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 Surgical: Mr.  Nardozzi  has a past surgical history that includes ganglion cyst removal (Right); Cataract  extraction w/PHACO (Left, 02/04/2016); Colonoscopy; Pilonidal cyst excision; Cardiac catheterization (1999?); Cataract extraction w/PHACO (Right, 03/03/2016); Eye surgery (May and June); and Colonoscopy with propofol (N/A, 06/05/2018). Family: family history includes Dementia in his mother; Diabetes in his brother; Heart disease in his brother, brother, and father.  Laboratory Chemistry Profile   Renal Lab Results  Component Value Date   BUN 16 08/17/2014   CREATININE 1.10 10/25/2018   GFRAA >60 08/17/2014   GFRNONAA >60 08/17/2014     Hepatic Lab Results  Component Value Date   AST 15 08/17/2014   ALT 20 08/17/2014   ALBUMIN 2.7 (L) 08/17/2014   ALKPHOS 43 (L) 08/17/2014   LIPASE 75 08/16/2014     Electrolytes Lab Results  Component Value Date   NA 144 08/17/2014   K 3.3 (L) 08/17/2014   CL 111 (H) 08/17/2014   CALCIUM 7.5 (L) 08/17/2014   MG 1.7 (L) 08/16/2014     Bone No results found for: VD25OH, [id number], [id number], XA1287OM7, 25OHVITD1, 25OHVITD2, 25OHVITD3, TESTOFREE, TESTOSTERONE   Inflammation (CRP: Acute Phase) (ESR: Chronic Phase) Lab Results  Component Value Date   CRP 2 08/28/2020   ESRSEDRATE 26 08/28/2020       Note: Above Lab results reviewed.  Recent Imaging Review  DG PAIN CLINIC C-ARM 1-60 MIN NO REPORT Fluoro was used, but no Radiologist interpretation will be provided.  Please refer to "NOTES" tab for provider progress note. Note: Reviewed        Physical Exam  General appearance: Well nourished, well developed, and well hydrated. In no apparent acute distress Mental status: Alert, oriented x 3 (person, place, & time)       Respiratory: No evidence of acute respiratory distress Eyes: PERLA Vitals: BP (!) 146/85   Pulse 82   Temp 98.3 F (36.8 C)   Resp 18   Ht '5\' 10"'  (1.778 m)   Wt 220 lb (99.8 kg)   SpO2 96%   BMI 31.57 kg/m  BMI: Estimated  body mass index is 31.57 kg/m as calculated from the following:   Height as of this encounter: '5\' 10"'  (1.778 m).   Weight as of this encounter: 220 lb (99.8 kg). Ideal: Ideal body weight: 73 kg (160 lb 15 oz) Adjusted ideal body weight: 83.7 kg (184 lb 9 oz)  Assessment   Status Diagnosis  Controlled Controlled Controlled 1. Chronic pain syndrome   2. Chronic hip pain (Left)   3. Osteoarthritis of hip (Left)   4. Enthesopathy of hip region (Left)   5. Other bursitis of hip (Left)   6. Arthralgia of hip (Left)   7. Chronic lower extremity pain (Primary Area of Pain) (Right)   8. Chronic midline low back pain (Secondary area of Pain) w/ sciatica (Right)   9. Chronic neck pain (Third area of Pain) (Left)   10. Lumbar radiculitis (Right) (L3 & L4)   11. Other intervertebral disc degeneration, lumbar region   12. 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  Plan of Care  Problem-specific:  No problem-specific Assessment & Plan notes found for this encounter.  Mr. MCCADE SULLENBERGER has a current medication list which includes the following long-term medication(s): amlodipine, fluticasone, gabapentin, hypertonic nasal wash, simvastatin, and valsartan-hydrochlorothiazide.  Pharmacotherapy (Medications Ordered): No orders of the defined types were placed in this encounter.  Orders:  No orders of the defined types were placed in this encounter.  Follow-up plan:   Return  if symptoms worsen or fail to improve.      Interventional management options: Planned, scheduled, and/or pending:   None at this time    Considering:   Diagnostic right L3 TFESI  Diagnostic right L4 TFESI  Diagnostic bilateral lumbar facet block  Possible bilateral lumbar facet RFA  Diagnostic Trigger point injection  Diagnostic  bilateral cervical facet nerve block  Possible bilateral cervical facet RFA    Palliative PRN treatment(s):   Palliative  right L4-5 LESI (none done by me) The patient indicated having had LESI x 4, by Dr. Primus Bravo. (before 2000) (Procedure described as helpful).       Recent Visits Date Type Provider Dept  09/09/20 Procedure visit Milinda Pointer, MD [facility]  08/28/20 Office Visit Milinda Pointer, MD [facility]  06/30/20 Telemedicine Milinda Pointer, MD [facility]  Showing recent visits within past 90 days and meeting all other requirements Today's Visits Date Type Provider Dept  09/24/20 Office Visit Milinda Pointer, MD [facility]  Showing today's visits and meeting all other requirements Future Appointments No visits were found meeting these conditions. Showing future appointments within next 90 days and meeting all other requirements  I discussed the assessment and treatment plan with the patient. The patient was provided an opportunity to ask questions and all were answered. The patient agreed with the plan and demonstrated an understanding of the instructions.  Patient advised to call back or seek an in-person evaluation if the symptoms or condition worsens.  Duration of encounter: 30 minutes.  Note by: Gaspar Cola, MD Date: 09/24/2020; Time: 9:33 AM

## 2020-09-24 ENCOUNTER — Other Ambulatory Visit: Payer: Self-pay

## 2020-09-24 ENCOUNTER — Ambulatory Visit: Payer: Medicare PPO | Attending: Pain Medicine | Admitting: Pain Medicine

## 2020-09-24 ENCOUNTER — Encounter: Payer: Self-pay | Admitting: Pain Medicine

## 2020-09-24 VITALS — BP 146/85 | HR 82 | Temp 98.3°F | Resp 18 | Ht 70.0 in | Wt 220.0 lb

## 2020-09-24 DIAGNOSIS — M5416 Radiculopathy, lumbar region: Secondary | ICD-10-CM | POA: Insufficient documentation

## 2020-09-24 DIAGNOSIS — M1612 Unilateral primary osteoarthritis, left hip: Secondary | ICD-10-CM | POA: Diagnosis present

## 2020-09-24 DIAGNOSIS — M79604 Pain in right leg: Secondary | ICD-10-CM | POA: Diagnosis present

## 2020-09-24 DIAGNOSIS — M76892 Other specified enthesopathies of left lower limb, excluding foot: Secondary | ICD-10-CM | POA: Diagnosis not present

## 2020-09-24 DIAGNOSIS — G8929 Other chronic pain: Secondary | ICD-10-CM | POA: Insufficient documentation

## 2020-09-24 DIAGNOSIS — M7072 Other bursitis of hip, left hip: Secondary | ICD-10-CM | POA: Insufficient documentation

## 2020-09-24 DIAGNOSIS — G894 Chronic pain syndrome: Secondary | ICD-10-CM | POA: Insufficient documentation

## 2020-09-24 DIAGNOSIS — M542 Cervicalgia: Secondary | ICD-10-CM | POA: Insufficient documentation

## 2020-09-24 DIAGNOSIS — M5441 Lumbago with sciatica, right side: Secondary | ICD-10-CM | POA: Diagnosis present

## 2020-09-24 DIAGNOSIS — M48061 Spinal stenosis, lumbar region without neurogenic claudication: Secondary | ICD-10-CM | POA: Diagnosis present

## 2020-09-24 DIAGNOSIS — M25552 Pain in left hip: Secondary | ICD-10-CM | POA: Diagnosis not present

## 2020-09-24 DIAGNOSIS — M5136 Other intervertebral disc degeneration, lumbar region: Secondary | ICD-10-CM | POA: Insufficient documentation

## 2020-09-24 NOTE — Progress Notes (Signed)
Safety precautions to be maintained throughout the outpatient stay will include: orient to surroundings, keep bed in low position, maintain call bell within reach at all times, provide assistance with transfer out of bed and ambulation.  

## 2020-09-24 NOTE — Patient Instructions (Addendum)
____________________________________________________________________________________________  Pain Prevention Technique  Definition:   A technique used to minimize the effects of an activity known to cause inflammation or swelling, which in turn leads to an increase in pain.  Purpose: To prevent swelling from occurring. It is based on the fact that it is easier to prevent swelling from happening than it is to get rid of it, once it occurs.  Contraindications: 1. Anyone with allergy or hypersensitivity to the recommended medications. 2. Anyone taking anticoagulants (Blood Thinners) (e.g., Coumadin, Warfarin, Plavix, etc.). 3. Patients in Renal Failure.  Technique: Before you undertake an activity known to cause pain, or a flare-up of your chronic pain, and before you experience any pain, do the following:  1. On a full stomach, take 4 (four) over the counter Ibuprofens 200mg  tablets (Motrin), for a total of 800 mg. 2. In addition, take over the counter Magnesium 400 to 500 mg, before doing the activity.  3. Six (6) hours later, again on a full stomach, repeat the Ibuprofen. 4. That night, take a warm shower and stretch under the running warm water.  This technique may be sufficient to abort the pain and discomfort before it happens. Keep in mind that it takes a lot less medication to prevent swelling than it takes to eliminate it once it occurs.  ____________________________________________________________________________________________   Google: "Inflammatory Factor Diet"

## 2021-06-20 ENCOUNTER — Other Ambulatory Visit: Payer: Self-pay

## 2021-06-20 ENCOUNTER — Encounter: Payer: Self-pay | Admitting: Emergency Medicine

## 2021-06-20 ENCOUNTER — Ambulatory Visit
Admission: EM | Admit: 2021-06-20 | Discharge: 2021-06-20 | Disposition: A | Discharge status: Home / Self Care | Payer: Medicare PPO | Attending: Emergency Medicine | Admitting: Emergency Medicine

## 2021-06-20 DIAGNOSIS — W57XXXA Bitten or stung by nonvenomous insect and other nonvenomous arthropods, initial encounter: Secondary | ICD-10-CM

## 2021-06-20 DIAGNOSIS — R21 Rash and other nonspecific skin eruption: Secondary | ICD-10-CM

## 2021-06-20 MED ORDER — PREDNISONE 10 MG (21) PO TBPK: ORAL_TABLET | ORAL | 0 refills | Status: DC

## 2021-06-20 NOTE — ED Triage Notes (Addendum)
Patient c/o red itchy bumps that started on his lower legs on Thursday and has since spread to his arms and neck.

## 2021-06-20 NOTE — Discharge Instructions (Addendum)
Unfortunately, I am not sure where this is coming from.  You may take Claritin or Zyrtec during the day for itching, Benadryl at night.  Please be careful with the Benadryl.  The prednisone will help with the itching.  Continue looking for the source of these.  These appear to be insect bites.

## 2021-06-20 NOTE — ED Provider Notes (Signed)
HPI  SUBJECTIVE:  Brandon Bright is a 75 y.o. male who presents with itchy "whelps" starting 3 days ago after working out in the woods.  He thought that this might be chiggers, but it is spreading.  It otherwise has not changed.  Is nonmigratory.  Rash is on his right lower extremity, groin, right neck, forearms bilaterally.  He denies pain.  He reports exposure to poison oak as well.  No new lotions, soaps, detergents, change in medications, recent antibiotics, fevers, body aches, headaches, lymphadenopathy, flulike symptoms.  No contacts with similar rash.  No blood on the bedclothes in the morning, sensation being bitten at night.  States that he checked for bedbugs this morning and did not find any.  He does not have any pets in the home.  He tried Cal dense lotion without improvement in his symptoms.  No aggravating factors.  He has a past medical history of well diet-controlled diabetes, hypertension, hypercholesterolemia.  Chronic kidney disease per chart review, but patient denies this.  TML:[id number], Rolanda Jay, PA-C   Past Medical History:  Diagnosis Date   AGE (acute gastroenteritis) 08/19/2014   Anemia    hx of   ARF (acute renal failure) ([place]) 08/19/2014   Arthritis    osteo in head and neck   Chronic kidney disease    acute renal failue?   Colon polyps    Diverticulitis    Gallstones    Hypercholesterolemia    Hypertension    controlled on meds   Hypokalemia 01/02/2016   Peripheral vascular disease ([place])    varicose veins   Sclerosing mesenteritis ([place]) 12/15/2015    Past Surgical History:  Procedure Laterality Date   CARDIAC CATHETERIZATION  1999?   no issue/ DR Nehemiah Massed   CATARACT EXTRACTION W/PHACO Left 02/04/2016   Procedure: CATARACT EXTRACTION PHACO AND INTRAOCULAR LENS PLACEMENT ([place]) LEFT EYE;  Surgeon: Leandrew Koyanagi, MD;  Location: [place];  Service: Ophthalmology;  Laterality: Left;   CATARACT EXTRACTION W/PHACO Right 03/03/2016   Procedure:  CATARACT EXTRACTION PHACO AND INTRAOCULAR LENS PLACEMENT ([facility]);  Surgeon: Leandrew Koyanagi, MD;  Location: [place];  Service: Ophthalmology;  Laterality: Right;   COLONOSCOPY     COLONOSCOPY WITH PROPOFOL N/A 06/05/2018   Procedure: COLONOSCOPY WITH PROPOFOL;  Surgeon: Manya Silvas, MD;  Location: [facility] ENDOSCOPY;  Service: Endoscopy;  Laterality: N/A;   EYE SURGERY  May and June   bilateral cataract   ganglion cyst removal Right    PILONIDAL CYST EXCISION      Family History  Problem Relation Age of Onset   Heart disease Father    Dementia Mother    Heart disease Brother    Heart disease Brother    Diabetes Brother     Social History   Tobacco Use   Smoking status: Never   Smokeless tobacco: Never  Vaping Use   Vaping Use: Never used  Substance Use Topics   Alcohol use: No    Alcohol/week: 0.0 standard drinks   Drug use: No    No current facility-administered medications for this encounter.  Current Outpatient Medications:    amLODipine (NORVASC) 2.5 MG tablet, Take 5 mg by mouth daily., Disp: , Rfl:    cholecalciferol (VITAMIN D3) 25 MCG (1000 UT) tablet, Take 1,000 Units by mouth daily., Disp: , Rfl:    fluticasone (FLONASE) 50 MCG/ACT nasal spray, Place 2 sprays into both nostrils daily., Disp: 16 g, Rfl: 0   gabapentin (NEURONTIN) 300 MG capsule, Limit  2-3 tablets by mouth per day or twice per day if tolerated, Disp: 540 capsule, Rfl: 0   latanoprost (XALATAN) 0.005 % ophthalmic solution, Place 1 drop into both eyes at bedtime., Disp: , Rfl:    Melatonin 5 MG CHEW, Chew 5 mg by mouth at bedtime as needed. , Disp: , Rfl:    Multiple Vitamins-Minerals (CENTRUM SILVER 50+MEN) TABS, Take 1 tablet by mouth daily., Disp: , Rfl:    predniSONE (STERAPRED UNI-PAK 21 TAB) 10 MG (21) TBPK tablet, Dispense one 6 day pack. Take as directed with food., Disp: 21 tablet, Rfl: 0   simvastatin (ZOCOR) 20 MG tablet, Take 20 mg by mouth daily. bedtime, Disp: , Rfl:     valsartan-hydrochlorothiazide (DIOVAN-HCT) 160-12.5 MG tablet, , Disp: , Rfl:    Hypertonic Nasal Wash (SINUS RINSE KIT NA), Place into the nose as needed., Disp: , Rfl:    Multiple Vitamins-Minerals (PRESERVISION AREDS PO), Take 1 tablet by mouth 2 (two) times daily. Am and dinner, Disp: , Rfl:   Allergies  Allergen Reactions   Codeine Swelling and Rash    throat   Vicodin [Hydrocodone-Acetaminophen] Swelling    throat   Celebrex [Celecoxib]     Abdominal pain   Ultram [Tramadol]      ROS  As noted in HPI.   Physical Exam  BP 140/77 (BP Location: Left Arm)   Pulse 71   Temp 97.7 F (36.5 C) (Oral)   Resp 16   Ht '5\' 10"'  (1.778 m)   Wt 98 kg   SpO2 97%   BMI 30.99 kg/m   Constitutional: Well developed, well nourished, no acute distress Eyes:  EOMI, conjunctiva normal bilaterally HENT: Normocephalic, atraumatic,mucus membranes moist Respiratory: Normal inspiratory effort Cardiovascular: Normal rate GI: nondistended skin: No rash, skin intact Musculoskeletal: Erythematous, blanchable, nontender papular rash over right lower extremity, bilateral groin, and on right neck.  No vesicles, crusting, burrows between fingers.           Neurologic: Alert & oriented x 3, no focal neuro deficits Psychiatric: Speech and behavior appropriate   ED Course   Medications - No data to display  No orders of the defined types were placed in this encounter.   No results found for this or any previous visit (from the past 24 hour(s)). No results found.  ED Clinical Impression  1. Rash   2. Insect bite, unspecified site, initial encounter      ED Assessment/Plan  Appears to be insect bites, but cannot pinpoint the cause.  Suspect that he got into something while working in the woods.  It does not appear to be an infectious etiology, contact dermatitis.  We will send home with Claritin or Zyrtec, he may take Benadryl at night if necessary.  Advised him to be careful  with this.  6-day prednisone taper.  Advised him that this will elevate his sugars.  There is no evidence of infection at this time.  Follow-up with PMD as needed.  Calculated creatinine clearance from labs done on 04/2021 86 mL/min.  Discussed MDM, treatment plan with patient. . patient agrees with plan.   Meds ordered this encounter  Medications   predniSONE (STERAPRED UNI-PAK 21 TAB) 10 MG (21) TBPK tablet    Sig: Dispense one 6 day pack. Take as directed with food.    Dispense:  21 tablet    Refill:  0      *This clinic note was created using Lobby[employer]. Therefore, there may be occasional  mistakes despite careful proofreading.  ?    Melynda Ripple, MD 06/20/21 [phone]

## 2021-06-24 ENCOUNTER — Encounter: Payer: Self-pay | Admitting: Pain Medicine

## 2021-06-24 NOTE — Telephone Encounter (Signed)
Good morning?! This appointment will be in person. You will need to come to the office. [facility] is now opened and we have a Cabin crew . See you next week.

## 2021-06-26 NOTE — Progress Notes (Signed)
PROVIDER NOTE: Information contained herein reflects review and annotations entered in association with encounter. Interpretation of such information and data should be left to medically-trained personnel. Information provided to patient can be located elsewhere in the medical record under "Patient Instructions". Document created using STT-dictation technology, any transcriptional errors that may result from process are unintentional.    Patient: Maryagnes Amos  Service Category: E/M  Provider: Gaspar Cola, MD  DOB: 23-Sep-1946  DOS: 06/29/2021  Specialty: Interventional Pain Management  MRN: [id number]  Setting: Ambulatory outpatient  PCP: Donnamarie Rossetti, PA-C  Type: Established Patient    Referring Provider: Donnamarie Rossetti,*  Location: Office  Delivery: Face-to-face     HPI  Mr. KHALIB FENDLEY, a 75 y.o. year old male, is here today because of his Chronic hip pain, left [M25.552, G89.29]. Mr. Kathol primary complain today is Back Pain (low) Last encounter: My last encounter with him was on Visit date not found. Pertinent problems: Mr. 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 Pain Assessment:  Severity of Chronic pain is reported as a 0-No pain/10. Location: Back Lower/denies. Onset: More than a month ago. Quality:  . Timing: Rarely. Modifying factor(s): gabapentin. Vitals:  height is '5\' 10"'  (1.778 m) and weight is 220 lb (99.8 kg). His temporal temperature is 97.4 F (36.3 C) (abnormal). His blood pressure is 149/74 (abnormal) and his pulse is 86. His respiration is 18 and oxygen saturation is 96%.   Reason for encounter: follow-up evaluation.  This patient was last seen on 09/24/2020 as a postprocedure evaluation after having had a left intra-articular hip joint injection with 100% long-term relief of his pain.  The patient returns to the clinic today simply to keep his appointment and to let us know that he is still not having any pain.  He is still taking his gabapentin which is being prescribed by his primary care physician and he is doing well.  Pharmacotherapy Assessment  Analgesic: No opioid analgesics prescribed by our practice.   Monitoring: [place] PMP: PDMP reviewed during this encounter.       Pharmacotherapy: No side-effects or adverse reactions reported. Compliance: No problems identified. Effectiveness: Clinically acceptable.  Hart Rochester, RN  06/29/2021  8:20 AM  Sign when Signing Visit Safety precautions to be maintained throughout the outpatient stay will include: orient to surroundings, keep bed in low position, maintain call bell within reach at all times, provide assistance with transfer out of bed and ambulation.     UDS:  No results found for: SUMMARY   ROS  Constitutional: Denies any fever or chills Gastrointestinal: No reported hemesis, hematochezia, vomiting, or acute GI distress Musculoskeletal: Denies any acute onset joint swelling, redness, loss of ROM, or weakness Neurological: No reported episodes of acute onset apraxia, aphasia, dysarthria, agnosia, amnesia, paralysis, loss of coordination, or loss of consciousness  Medication  Review  Centrum  Silver 50+Men, Hypertonic Nasal Wash, Melatonin, Multiple Vitamins-Minerals, amLODipine, aspirin, cholecalciferol, fluticasone, gabapentin, latanoprost, simvastatin, and valsartan-hydrochlorothiazide  History Review  Allergy: Mr. Mcmanus is allergic to codeine, vicodin [hydrocodone-acetaminophen], celebrex [celecoxib], and ultram [tramadol]. Drug: Mr. Charrette  reports no history of drug use. Alcohol:  reports no history of alcohol use. Tobacco:  reports that he has never smoked. He has never used smokeless tobacco. Social: Mr. Studley  reports that he has never smoked. He has never used smokeless tobacco. He reports that he does not drink alcohol and does not use drugs. 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 Surgical: Mr. Raptis  has a past surgical history that includes ganglion cyst removal (Right); Cataract extraction w/PHACO (Left, 02/04/2016); Colonoscopy; Pilonidal cyst excision; Cardiac catheterization (1999?); Cataract extraction w/PHACO (Right, 03/03/2016); Eye surgery (May and June); and Colonoscopy with propofol (N/A, 06/05/2018). Family: family history includes Dementia in his mother; Diabetes in his brother; Heart disease in his brother, brother, and father.  Laboratory Chemistry Profile   Renal Lab Results  Component Value Date   BUN 16 08/17/2014   CREATININE 1.10 10/25/2018   GFRAA >60 08/17/2014   GFRNONAA >60 08/17/2014    Hepatic Lab Results  Component Value Date   AST 15 08/17/2014   ALT 20 08/17/2014   ALBUMIN 2.7 (L) 08/17/2014   ALKPHOS 43 (L) 08/17/2014   LIPASE 75 08/16/2014    Electrolytes Lab Results  Component Value Date   NA 144 08/17/2014   K 3.3  (L) 08/17/2014   CL 111 (H) 08/17/2014   CALCIUM 7.5 (L) 08/17/2014   MG 1.7 (L) 08/16/2014    Bone No results found for: VD25OH, FX588TG5QDI, YM4158XE9, MM7680SU1, 25OHVITD1, 25OHVITD2, 25OHVITD3, TESTOFREE, TESTOSTERONE  Inflammation (CRP: Acute Phase) (ESR: Chronic Phase) Lab Results  Component Value Date   CRP 2 08/28/2020   ESRSEDRATE 26 08/28/2020         Note: Above Lab results reviewed.  Recent Imaging Review  DG PAIN CLINIC C-ARM 1-60 MIN NO REPORT Fluoro was used, but no Radiologist interpretation will be provided.  Please refer to "NOTES" tab for provider progress note. Note: Reviewed        Physical Exam  General appearance: Well nourished, well developed, and well hydrated. In no apparent acute distress Mental status: Alert, oriented x 3 (person, place, & time)       Respiratory: No evidence of acute respiratory distress Eyes: PERLA Vitals: BP (!) 149/74   Pulse 86   Temp (!) 97.4 F (36.3 C) (Temporal)   Resp 18   Ht '5\' 10"'  (1.778 m)   Wt 220 lb (99.8 kg)   SpO2 96%   BMI 31.57 kg/m  BMI: Estimated body mass index is 31.57 kg/m as calculated from the following:   Height as of this encounter: '5\' 10"'  (1.778 m).   Weight as of this encounter: 220 lb (99.8 kg). Ideal: Ideal body weight: 73 kg (160 lb 15 oz) Adjusted ideal body weight: 83.7 kg (184 lb 9 oz)  Assessment   Status Diagnosis  Controlled Controlled Controlled 1. Chronic hip pain (Left)   2. Osteoarthritis of hip (Left)   3. Enthesopathy of hip region (Left)   4. Other bursitis of hip (Left)   5. Arthralgia of hip (Left)   6. Chronic  pain syndrome   7. Chronic lower extremity pain (Primary Area of Pain) (Right)   8. Chronic midline low back pain (Secondary area of Pain) w/ sciatica (Right)   9. Chronic neck pain (Third area of Pain) (Left)   10. Lumbar radiculitis (Right) (L3 & L4)   11. Encounter for chronic pain management      Updated Problems: No problems updated.  Plan of Care   Problem-specific:  No problem-specific Assessment & Plan notes found for this encounter.  Mr. PHONG ISENBERG has a current medication list which includes the following long-term medication(s): amlodipine, fluticasone, gabapentin, hypertonic nasal wash, simvastatin, and valsartan-hydrochlorothiazide.  Pharmacotherapy (Medications Ordered): No orders of the defined types were placed in this encounter.  Orders:  No orders of the defined types were placed in this encounter.  Follow-up plan:   Return if symptoms worsen or fail to improve.     Interventional Therapies  Risk  Complexity Considerations:   Estimated body mass index is 31.57 kg/m as calculated from the following:   Height as of this encounter: '5\' 10"'  (1.778 m).   Weight as of this encounter: 220 lb (99.8 kg). WNL   Planned  Pending:   Pending further evaluation   Under consideration:   Diagnostic right L3 TFESI  Diagnostic right L4 TFESI  Diagnostic bilateral lumbar facet block  Possible bilateral lumbar facet RFA  Diagnostic Trigger point injection  Diagnostic  bilateral cervical facet nerve block  Possible bilateral cervical facet RFA    Completed:   Diagnostic/therapeutic left IA hip joint injection x1 (09/09/2020) (100/100/100/100)    Therapeutic  Palliative (PRN) options:  Palliative/therapeutic left IA hip joint injection #2  Palliative right L4-5 LESI (none done by me) The patient indicated having had LESI x 4, by Dr. Primus Bravo. (before 2000) (Procedure described as helpful).      Recent Visits No visits were found meeting these conditions. Showing recent visits within past 90 days and meeting all other requirements Today's Visits Date Type Provider Dept  06/29/21 Office Visit Milinda Pointer, MD [facility]  Showing today's visits and meeting all other requirements Future Appointments No visits were found meeting these conditions. Showing future appointments within next 90 days and  meeting all other requirements I discussed the assessment and treatment plan with the patient. The patient was provided an opportunity to ask questions and all were answered. The patient agreed with the plan and demonstrated an understanding of the instructions.  Patient advised to call back or seek an in-person evaluation if the symptoms or condition worsens.  Duration of encounter: 20 minutes.  Note by: Gaspar Cola, MD Date: 06/29/2021; Time: 8:45 AM

## 2021-06-29 ENCOUNTER — Other Ambulatory Visit: Payer: Self-pay

## 2021-06-29 ENCOUNTER — Ambulatory Visit: Payer: Medicare PPO | Attending: Pain Medicine | Admitting: Pain Medicine

## 2021-06-29 VITALS — BP 149/74 | HR 86 | Temp 97.4°F | Resp 18 | Ht 70.0 in | Wt 220.0 lb

## 2021-06-29 DIAGNOSIS — M7072 Other bursitis of hip, left hip: Secondary | ICD-10-CM | POA: Diagnosis not present

## 2021-06-29 DIAGNOSIS — M76892 Other specified enthesopathies of left lower limb, excluding foot: Secondary | ICD-10-CM | POA: Diagnosis not present

## 2021-06-29 DIAGNOSIS — M25552 Pain in left hip: Secondary | ICD-10-CM | POA: Insufficient documentation

## 2021-06-29 DIAGNOSIS — M1612 Unilateral primary osteoarthritis, left hip: Secondary | ICD-10-CM | POA: Diagnosis not present

## 2021-06-29 DIAGNOSIS — G894 Chronic pain syndrome: Secondary | ICD-10-CM | POA: Diagnosis present

## 2021-06-29 DIAGNOSIS — G8929 Other chronic pain: Secondary | ICD-10-CM | POA: Insufficient documentation

## 2021-06-29 DIAGNOSIS — M5416 Radiculopathy, lumbar region: Secondary | ICD-10-CM | POA: Insufficient documentation

## 2021-06-29 DIAGNOSIS — M79604 Pain in right leg: Secondary | ICD-10-CM | POA: Diagnosis present

## 2021-06-29 DIAGNOSIS — M542 Cervicalgia: Secondary | ICD-10-CM | POA: Insufficient documentation

## 2021-06-29 DIAGNOSIS — M5441 Lumbago with sciatica, right side: Secondary | ICD-10-CM | POA: Insufficient documentation

## 2021-06-29 NOTE — Progress Notes (Signed)
Safety precautions to be maintained throughout the outpatient stay will include: orient to surroundings, keep bed in low position, maintain call bell within reach at all times, provide assistance with transfer out of bed and ambulation.  

## 2021-09-17 ENCOUNTER — Encounter: Payer: Self-pay | Admitting: Emergency Medicine

## 2021-09-17 ENCOUNTER — Ambulatory Visit
Admission: EM | Admit: 2021-09-17 | Discharge: 2021-09-17 | Disposition: A | Discharge status: Home / Self Care | Payer: Medicare PPO | Attending: Emergency Medicine | Admitting: Emergency Medicine

## 2021-09-17 ENCOUNTER — Ambulatory Visit (INDEPENDENT_AMBULATORY_CARE_PROVIDER_SITE_OTHER): Payer: Medicare PPO

## 2021-09-17 DIAGNOSIS — R059 Cough, unspecified: Secondary | ICD-10-CM

## 2021-09-17 DIAGNOSIS — U071 COVID-19: Secondary | ICD-10-CM | POA: Insufficient documentation

## 2021-09-17 DIAGNOSIS — R5383 Other fatigue: Secondary | ICD-10-CM | POA: Diagnosis not present

## 2021-09-17 LAB — RESP PANEL BY RT-PCR (FLU A&B, COVID) ARPGX2
Influenza A by PCR: NEGATIVE
Influenza B by PCR: NEGATIVE
SARS Coronavirus 2 by RT PCR: POSITIVE — AB

## 2021-09-17 MED ORDER — IPRATROPIUM BROMIDE 0.06 % NA SOLN: 2.0000 | Freq: Four times a day (QID) | NASAL | 12 refills | Status: AC

## 2021-09-17 MED ORDER — BENZONATATE 100 MG PO CAPS: 200.0000 mg | ORAL_CAPSULE | Freq: Three times a day (TID) | ORAL | 0 refills | Status: DC

## 2021-09-17 MED ORDER — MOLNUPIRAVIR EUA 200MG CAPSULE: 4.0000 | ORAL_CAPSULE | Freq: Two times a day (BID) | ORAL | 0 refills | Status: AC

## 2021-09-17 NOTE — ED Triage Notes (Signed)
Pt presents today with c/o of body aches, headache with cough with congestion since yesterday. Pain 0/10. He took two Bayer Aspirin 325 mg (ea) this morning at 8:00 am.

## 2021-09-17 NOTE — Discharge Instructions (Addendum)
You will have to quarantine for 5 days from the start of your symptoms.  After 5 days you can break quarantine if your symptoms have improved and you have not had a fever for 24 hours without taking Tylenol or ibuprofen.  Use over-the-counter Tylenol and ibuprofen as needed for body aches and fever.  Use the Tessalon Perles every 8 hours as needed for cough.  Use the Atrovent nasal spray, 2 squirts up each nostril every 6 hours, as needed for runny nose and nasal congestion.  Take molnupiravir twice daily for treatment of COVID-19.  If you develop any increased shortness of breath-especially at rest, you are unable to speak in full sentences, or is a late sign your lips are turning blue you need to go the ER for evaluation.

## 2021-09-17 NOTE — ED Provider Notes (Signed)
[facility]    CSN: [id number] Arrival date & time: 09/17/21  1040      History   Chief Complaint Chief Complaint  Patient presents with   Cough   Headache   Fatigue    HPI ELDEN BRUCATO is a 75 y.o. male.   HPI  75 year old male here for evaluation of respiratory complaints.  Patient reports that his symptoms started yesterday and he began with a productive cough for yellow sputum as well as a feeling of being cold.  This progressed into a headache, body aches, and fatigue.  Patient reports that his cough has been dry today.  He denies fever, runny nose or nasal congestion, ear pain, sore throat, shortness breath or wheezing, GI complaints, or known sick contacts other than his wife with similar symptoms.  He was at a doctor's appointment earlier in the week at [facility].  Past Medical History:  Diagnosis Date   AGE (acute gastroenteritis) 08/19/2014   Anemia    hx of   ARF (acute renal failure) ([place]) 08/19/2014   Arthritis    osteo in head and neck   Chronic kidney disease    acute renal failue?   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    Patient Active Problem List   Diagnosis Date Noted   Enthesopathy of hip region (Left) 09/09/2020   Other bursitis of hip (Left) 09/09/2020   Chronic hip pain (Left) 08/28/2020   Arthralgia of hip (Left) 08/28/2020   Osteoarthritis of hip (Left) 08/28/2020   BPH without obstruction/lower urinary tract symptoms 03/19/2020   Type 2 diabetes mellitus without complication, without long-term current use of insulin ([place]) 03/18/2020   Cervicalgia 07/02/2019   Vitamin D deficiency 05/21/2019   Lymphocytic colitis 07/04/2018   Medicare annual wellness visit, subsequent 07/04/2018   BPH associated with nocturia 04/05/2018    Lumbar radiculitis (Right) (L3 & L4) 07/19/2017   Lumbar foraminal stenosis (Right: Severe L3-4 & L4-5) 07/19/2017   Lumbar disc protrusion (Left: L2-3) (Central: L5-S1) 07/19/2017   Lumbar facet arthropathy (Bilateral) 07/19/2017   Lumbar spondylosis 08/09/2016   Chronic midline low back pain (Secondary area of Pain) w/ sciatica (Right) 08/09/2016   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   Chronic neck pain (Third area of Pain) (Left) 08/09/2016   Cholelithiasis 07/29/2016   Degenerative arthritis of spine 07/29/2016   Hyperlipidemia 07/29/2016   Hypertension 07/29/2016   Iron deficiency anemia 07/29/2016   Right bundle branch block 07/29/2016   Varicose veins of lower extremity 07/29/2016   Chronic lower extremity pain (Primary Area of Pain) (Right) 07/29/2016   Chronic pain syndrome 07/29/2016   Tendinitis of wrist 12/15/2015   Lumbar facet syndrome 09/02/2015   Cervical facet syndrome 09/02/2015   DDD (degenerative disc disease), cervical 05/01/2015   DDD (degenerative disc disease), lumbar 05/01/2015    Past Surgical History:  Procedure Laterality Date   [place]?   no issue/ DR Nehemiah Massed   CATARACT EXTRACTION W/PHACO Left 02/04/2016   Procedure: CATARACT EXTRACTION PHACO AND INTRAOCULAR LENS PLACEMENT ([place]) LEFT EYE;  Surgeon: Leandrew Koyanagi, MD;  Location: [facility];  Service: Ophthalmology;  Laterality: Left;   CATARACT EXTRACTION W/PHACO Right 03/03/2016   Procedure: CATARACT EXTRACTION PHACO AND INTRAOCULAR LENS PLACEMENT ([facility]);  Surgeon: Leandrew Koyanagi, MD;  Location: [facility];  Service: Ophthalmology;  Laterality: Right;   COLONOSCOPY     COLONOSCOPY WITH PROPOFOL N/A 06/05/2018   Procedure: COLONOSCOPY WITH PROPOFOL;  Surgeon: Manya Silvas, MD;  Location: [facility] ENDOSCOPY;  Service: Endoscopy;  Laterality: N/A;   EYE SURGERY  May and June   bilateral cataract   ganglion cyst removal Right    PILONIDAL CYST  EXCISION         Home Medications    Prior to Admission medications   Medication Sig Start Date End Date Taking? Authorizing Provider  benzonatate (TESSALON) 100 MG capsule Take 2 capsules (200 mg total) by mouth every 8 (eight) hours. 09/17/21  Yes Margarette Canada, NP  ipratropium (ATROVENT) 0.06 % nasal spray Place 2 sprays into both nostrils 4 (four) times daily. 09/17/21  Yes Margarette Canada, NP  molnupiravir EUA (LAGEVRIO) 200 mg CAPS capsule Take 4 capsules (800 mg total) by mouth 2 (two) times daily for 5 days. 09/17/21 09/22/21 Yes Margarette Canada, NP  amLODipine (NORVASC) 2.5 MG tablet Take 5 mg by mouth daily. 02/20/19   [provider]  aspirin 325 MG tablet Take 325 mg by mouth daily.    [provider]  cholecalciferol (VITAMIN D3) 25 MCG (1000 UT) tablet Take 1,000 Units by mouth daily.    [provider]  fluticasone (FLONASE) 50 MCG/ACT nasal spray Place 2 sprays into both nostrils daily. 05/25/18   Lorin Picket, PA-C  gabapentin (NEURONTIN) 300 MG capsule Limit 2-3 tablets by mouth per day or twice per day if tolerated 03/25/16   Mohammed Kindle, MD  Hypertonic Nasal Wash (SINUS RINSE KIT NA) Place into the nose as needed.    [provider]  latanoprost (XALATAN) 0.005 % ophthalmic solution Place 1 drop into both eyes at bedtime.    [provider]  Melatonin 5 MG CHEW Chew 5 mg by mouth at bedtime as needed.     [provider]  Multiple Vitamins-Minerals (CENTRUM SILVER 50+MEN) TABS Take 1 tablet by mouth daily.    [provider]  Multiple Vitamins-Minerals (PRESERVISION AREDS PO) Take 1 tablet by mouth 2 (two) times daily. Am and dinner    [provider]  simvastatin (ZOCOR) 20 MG tablet Take 20 mg by mouth daily. bedtime    [provider]  valsartan-hydrochlorothiazide (DIOVAN-HCT) 160-12.5 MG tablet  02/06/20   [provider]    Family History Family History  Problem Relation Age  of Onset   Heart disease Father    Dementia Mother    Heart disease Brother    Heart disease Brother    Diabetes Brother     Social History Social History   Tobacco Use   Smoking status: Never   Smokeless tobacco: Never  Vaping Use   Vaping Use: Never used  Substance Use Topics   Alcohol use: No    Alcohol/week: 0.0 standard drinks   Drug use: No     Allergies   Codeine, Vicodin [hydrocodone-acetaminophen], Celebrex [celecoxib], and Ultram [tramadol]   Review of Systems Review of Systems  Constitutional:  Positive for chills, fatigue and fever. Negative for activity change and appetite change.  HENT:  Negative for congestion, ear pain, rhinorrhea and sore throat.   Respiratory:  Positive for cough. Negative for shortness of breath and wheezing.   Gastrointestinal:  Negative for diarrhea, nausea and vomiting.  Musculoskeletal:  Positive for arthralgias and myalgias.  Skin:  Negative for rash.  Neurological:  Positive for headaches.  Hematological: Negative.   Psychiatric/Behavioral: Negative.      Physical Exam Triage Vital Signs ED Triage Vitals  Enc Vitals Group     BP 09/17/21 1228 106/67     Pulse Rate 09/17/21 1228 96     Resp 09/17/21 1228 18     Temp 09/17/21 1228 99.1 F (37.3 C)     Temp Source 09/17/21 1228 Oral     SpO2 09/17/21 1228 93 %     Weight --      Height --      Head Circumference --      Peak Flow --      Pain Score 09/17/21 1226 0     Pain Loc --      Pain Edu? --      Excl. in [place]? --    No data found.  Updated Vital Signs BP 106/67 (BP Location: Right Arm)    Pulse 96    Temp 99.1 F (37.3 C) (Oral)    Resp 18    SpO2 93%   Visual Acuity Right Eye Distance:   Left Eye Distance:   Bilateral Distance:    Right Eye Near:   Left Eye Near:    Bilateral Near:     Physical Exam Vitals and nursing note reviewed.  Constitutional:      General: He is not in acute distress.    Appearance: Normal appearance. He is not  ill-appearing.  HENT:     Head: Normocephalic and atraumatic.     Right Ear: Tympanic membrane, ear canal and external ear normal. There is no impacted cerumen.     Left Ear: Tympanic membrane, ear canal and external ear normal. There is no impacted cerumen.     Nose: Congestion and rhinorrhea present.     Mouth/Throat:     Mouth: Mucous membranes are moist.     Pharynx: Oropharynx is clear. No posterior oropharyngeal erythema.  Cardiovascular:     Rate and Rhythm: Normal rate and regular rhythm.     Pulses: Normal pulses.     Heart sounds: Normal heart sounds. No murmur heard.   No gallop.  Pulmonary:     Effort: Pulmonary effort is normal.     Breath sounds: Normal breath sounds. No wheezing, rhonchi or rales.  Musculoskeletal:     Cervical back: Normal range of motion and neck supple.  Lymphadenopathy:     Cervical: No cervical adenopathy.  Skin:    General: Skin is warm and dry.     Capillary Refill: Capillary refill takes less than 2 seconds.     Findings: No erythema or rash.  Neurological:     General: No focal deficit present.     Mental Status: He is alert and oriented to person, place, and time.  Psychiatric:        Mood and Affect: Mood normal.        Behavior: Behavior normal.        Thought Content: Thought content normal.        Judgment: Judgment normal.     UC Treatments / Results  Labs (all labs ordered are listed, but only abnormal results are displayed) Labs Reviewed  RESP PANEL BY RT-PCR (FLU A&B, COVID) ARPGX2 - Abnormal; Notable for the following components:      Result Value   SARS Coronavirus 2 by RT PCR POSITIVE (*)    All other components within normal limits    EKG  Radiology DG Chest 2 View  Result Date: 09/17/2021 CLINICAL DATA:  Fatigue, cough EXAM: CHEST - 2 VIEW COMPARISON:  Chest x-ray 05/09/2011 FINDINGS: Heart size and mediastinal contours are within normal limits. No focal consolidation identified. Small linear opacity at the  left lung base may represent scarring or platelike atelectasis. No pleural effusion or pneumothorax visualized. No acute osseous abnormality appreciated. IMPRESSION: Small scarring or platelike atelectasis at the left lung base. Electronically Signed   By: Ofilia Neas M.D.   On: 09/17/2021 13:15    Procedures Procedures (including critical care time)  Medications Ordered in UC Medications - No data to display  Initial Impression / Assessment and Plan / UC Course  I have reviewed the triage vital signs and the nursing notes.  Pertinent labs & imaging results that were available during my care of the patient were reviewed by me and considered in my medical decision making (see chart for details).  Patient is a nontoxic-appearing 75 year old male here for evaluation of respiratory symptoms that began yesterday include a productive cough, fatigue, headaches, and body aches.  The triage note reports the patient complained of nasal congestion but he denied it when taking history.  He also initially denied any production to his cough but after the assessment was complete he states that he had a productive cough twice yesterday for yellow sputum and felt cold.  When he woke up this morning he also felt cold and he continues to feel fatigued.  On physical exam patient has pearly gray tympanic membranes bilaterally with normal light reflex and clear external auditory canals.  Nasal mucosa is mildly edematous and erythematous with scant clear nasal discharge in both nares.  Oropharyngeal exam is benign.  No cervical lymphadenopathy appreciated on exam.  Cardiopulmonary exam reveals clear lung sounds in all fields.  Due to the patient having a mildly elevated temp of 99.1 here in clinic and sputum production was cough yesterday will obtain chest x-ray to look for possible pneumonia.  Will also swab patient for COVID and flu.  Radiology impression of chest x-ray is small scarring or platelike atelectasis at  the left lung base but otherwise unremarkable.  No pneumonia.  Respiratory triplex panel is positive for COVID.  Patient is GFR 72 according to chemistry drawn on 05/11/2021.  We will also discharge patient home on molnupiravir for treatment of COVID-19 along with Atrovent nasal spray for nasal congestion and Tessalon Perles for cough.  Final Clinical Impressions(s) / UC Diagnoses   Final diagnoses:  [id number]     Discharge Instructions      You will have to quarantine for 5 days from the start of your symptoms.  After 5 days you can break quarantine if your symptoms have improved and you have not had a fever for 24 hours without taking Tylenol or ibuprofen.  Use over-the-counter Tylenol and ibuprofen as needed for body aches and fever.  Use the Tessalon Perles every 8 hours as needed for cough.  Use the Atrovent nasal spray, 2 squirts up each nostril every 6 hours, as needed for runny nose and nasal congestion.  Take molnupiravir twice daily for treatment of COVID-19.  If you develop any increased shortness of breath-especially at rest, you are unable to speak in full sentences, or is a late sign your lips are turning blue you need to go the ER for evaluation.      ED Prescriptions     Medication Sig Dispense Auth. Provider   molnupiravir EUA (LAGEVRIO) 200  mg CAPS capsule Take 4 capsules (800 mg total) by mouth 2 (two) times daily for 5 days. 40 capsule Margarette Canada, NP   benzonatate (TESSALON) 100 MG capsule Take 2 capsules (200 mg total) by mouth every 8 (eight) hours. 21 capsule Margarette Canada, NP   ipratropium (ATROVENT) 0.06 % nasal spray Place 2 sprays into both nostrils 4 (four) times daily. 15 mL Margarette Canada, NP      PDMP not reviewed this encounter.   Margarette Canada, NP 09/17/21 1350

## 2021-11-11 ENCOUNTER — Other Ambulatory Visit
Admission: RE | Admit: 2021-11-11 | Discharge: 2021-11-11 | Disposition: A | Discharge status: Home / Self Care | Payer: Medicare PPO | Source: Ambulatory Visit | Attending: Family Medicine | Admitting: Family Medicine

## 2021-11-11 DIAGNOSIS — R0602 Shortness of breath: Secondary | ICD-10-CM | POA: Insufficient documentation

## 2021-11-11 LAB — TROPONIN I (HIGH SENSITIVITY): Troponin I (High Sensitivity): 8 ng/L (ref <18)

## 2022-03-10 ENCOUNTER — Ambulatory Visit: Payer: Medicare PPO | Admitting: Pain Medicine

## 2022-03-10 ENCOUNTER — Encounter: Payer: Self-pay | Admitting: Pain Medicine

## 2022-03-10 ENCOUNTER — Ambulatory Visit
Admission: RE | Admit: 2022-03-10 | Discharge: 2022-03-10 | Disposition: A | Discharge status: Home / Self Care | Payer: Medicare PPO | Source: Ambulatory Visit | Attending: Pain Medicine | Admitting: Pain Medicine

## 2022-03-10 VITALS — BP 144/79 | HR 75 | Temp 97.5°F | Resp 14 | Ht 70.0 in | Wt 220.0 lb

## 2022-03-10 DIAGNOSIS — M48061 Spinal stenosis, lumbar region without neurogenic claudication: Secondary | ICD-10-CM | POA: Insufficient documentation

## 2022-03-10 DIAGNOSIS — M47816 Spondylosis without myelopathy or radiculopathy, lumbar region: Secondary | ICD-10-CM | POA: Diagnosis present

## 2022-03-10 DIAGNOSIS — M5126 Other intervertebral disc displacement, lumbar region: Secondary | ICD-10-CM

## 2022-03-10 DIAGNOSIS — M5416 Radiculopathy, lumbar region: Secondary | ICD-10-CM | POA: Insufficient documentation

## 2022-03-10 DIAGNOSIS — M5441 Lumbago with sciatica, right side: Secondary | ICD-10-CM | POA: Insufficient documentation

## 2022-03-10 DIAGNOSIS — M79604 Pain in right leg: Secondary | ICD-10-CM

## 2022-03-10 DIAGNOSIS — M5417 Radiculopathy, lumbosacral region: Secondary | ICD-10-CM | POA: Insufficient documentation

## 2022-03-10 DIAGNOSIS — M5136 Other intervertebral disc degeneration, lumbar region with discogenic back pain only: Secondary | ICD-10-CM

## 2022-03-10 DIAGNOSIS — R937 Abnormal findings on diagnostic imaging of other parts of musculoskeletal system: Secondary | ICD-10-CM | POA: Insufficient documentation

## 2022-03-10 DIAGNOSIS — M51369 Other intervertebral disc degeneration, lumbar region without mention of lumbar back pain or lower extremity pain: Secondary | ICD-10-CM

## 2022-03-10 DIAGNOSIS — G8929 Other chronic pain: Secondary | ICD-10-CM | POA: Insufficient documentation

## 2022-03-10 DIAGNOSIS — Z7982 Long term (current) use of aspirin: Secondary | ICD-10-CM | POA: Insufficient documentation

## 2022-03-10 DIAGNOSIS — M1612 Unilateral primary osteoarthritis, left hip: Secondary | ICD-10-CM | POA: Insufficient documentation

## 2022-03-10 NOTE — Patient Instructions (Addendum)
______________________________________________________________________  Preparing for Procedure with Sedation  NOTICE: Due to recent regulatory changes, starting on April 27, 2021, procedures requiring intravenous (IV) sedation will no longer be performed at the [facility].  These types of procedures are required to be performed at [facility] ambulatory surgery facility.  We are very sorry for the inconvenience.  Procedure appointments are limited to planned procedures: No Prescription Refills. No disability issues will be discussed. No medication changes will be discussed.  Instructions: Oral Intake: Do not eat or drink anything for at least 8 hours prior to your procedure. (Exception: Blood Pressure Medication. See below.) Transportation: A driver is required. You may not drive yourself after the procedure. Blood Pressure Medicine: Do not forget to take your blood pressure medicine with a sip of water the morning of the procedure. If your Diastolic (lower reading) is above 100 mmHg, elective cases will be cancelled/rescheduled. Blood thinners: These will need to be stopped for procedures. Notify our staff if you are taking any blood thinners. Depending on which one you take, there will be specific instructions on how and when to stop it. Diabetics on insulin: Notify the staff so that you can be scheduled 1st case in the morning. If your diabetes requires high dose insulin, take only  of your normal insulin dose the morning of the procedure and notify the staff that you have done so. Preventing infections: Shower with an antibacterial soap the morning of your procedure. Build-up your immune system: Take 1000 mg of Vitamin C with every meal (3 times a day) the day prior to your procedure. Antibiotics: Inform the staff if you have a condition or reason that requires you to take antibiotics before dental procedures. Pregnancy: If you are pregnant, call and cancel the procedure. Sickness: If  you have a cold, fever, or any active infections, call and cancel the procedure. Arrival: You must be in the facility at least 30 minutes prior to your scheduled procedure. Children: Do not bring children with you. Dress appropriately: There is always the possibility that your clothing may get soiled. Valuables: Do not bring any jewelry or valuables.  Reasons to call and reschedule or cancel your procedure: (Following these recommendations will minimize the risk of a serious complication.) Surgeries: Avoid having procedures within 2 weeks of any surgery. (Avoid for 2 weeks before or after any surgery). Flu Shots: Avoid having procedures within 2 weeks of a flu shots. (Avoid for 2 weeks before or after immunizations). Barium: Avoid having a procedure within 7-10 days after having had a radiological study involving the use of radiological contrast. (Myelograms, Barium swallow or enema study). Heart attacks: Avoid any elective procedures or surgeries for the initial 6 months after a "Myocardial Infarction" (Heart Attack). Blood thinners: It is imperative that you stop these medications before procedures. Let us know if you if you take any blood thinner.  Infection: Avoid procedures during or within two weeks of an infection (including chest colds or gastrointestinal problems). Symptoms associated with infections include: Localized redness, fever, chills, night sweats or profuse sweating, burning sensation when voiding, cough, congestion, stuffiness, runny nose, sore throat, diarrhea, nausea, vomiting, cold or Flu symptoms, recent or current infections. It is specially important if the infection is over the area that we intend to treat. Heart and lung problems: Symptoms that may suggest an active cardiopulmonary problem include: cough, chest pain, breathing difficulties or shortness of breath, dizziness, ankle swelling, uncontrolled high or unusually low blood pressure, and/or palpitations. If you are    experiencing any of these symptoms, cancel your procedure and contact your primary care physician for an evaluation.  Remember:  Regular Business hours are:  Monday to Thursday 8:00 AM to 4:00 PM  Provider's Schedule: Francisco Naveira, MD:  Procedure days: Tuesday and Thursday 7:30 AM to 4:00 PM  Bilal Lateef, MD:  Procedure days: Monday and Wednesday 7:30 AM to 4:00 PM ______________________________________________________________________  ____________________________________________________________________________________________  General Risks and Possible Complications  Patient Responsibilities: It is important that you read this as it is part of your informed consent. It is our duty to inform you of the risks and possible complications associated with treatments offered to you. It is your responsibility as a patient to read this and to ask questions about anything that is not clear or that you believe was not covered in this document.  Patient's Rights: You have the right to refuse treatment. You also have the right to change your mind, even after initially having agreed to have the treatment done. However, under this last option, if you wait until the last second to change your mind, you may be charged for the materials used up to that point.  Introduction: Medicine is not an exact science. Everything in Medicine, including the lack of treatment(s), carries the potential for danger, harm, or loss (which is by definition: Risk). In Medicine, a complication is a secondary problem, condition, or disease that can aggravate an already existing one. All treatments carry the risk of possible complications. The fact that a side effects or complications occurs, does not imply that the treatment was conducted incorrectly. It must be clearly understood that these can happen even when everything is done following the highest safety standards.  No treatment: You can choose not to proceed with the  proposed treatment alternative. The "PRO(s)" would include: avoiding the risk of complications associated with the therapy. The "CON(s)" would include: not getting any of the treatment benefits. These benefits fall under one of three categories: diagnostic; therapeutic; and/or palliative. Diagnostic benefits include: getting information which can ultimately lead to improvement of the disease or symptom(s). Therapeutic benefits are those associated with the successful treatment of the disease. Finally, palliative benefits are those related to the decrease of the primary symptoms, without necessarily curing the condition (example: decreasing the pain from a flare-up of a chronic condition, such as incurable terminal cancer).  General Risks and Complications: These are associated to most interventional treatments. They can occur alone, or in combination. They fall under one of the following six (6) categories: no benefit or worsening of symptoms; bleeding; infection; nerve damage; allergic reactions; and/or death. No benefits or worsening of symptoms: In Medicine there are no guarantees, only probabilities. No healthcare provider can ever guarantee that a medical treatment will work, they can only state the probability that it may. Furthermore, there is always the possibility that the condition may worsen, either directly, or indirectly, as a consequence of the treatment. Bleeding: This is more common if the patient is taking a blood thinner, either prescription or over the counter (example: Goody Powders, Fish oil, Aspirin, Garlic, etc.), or if suffering a condition associated with impaired coagulation (example: Hemophilia, cirrhosis of the liver, low platelet counts, etc.). However, even if you do not have one on these, it can still happen. If you have any of these conditions, or take one of these drugs, make sure to notify your treating physician. Infection: This is more common in patients with a compromised  immune system, either due to disease (example:   diabetes, cancer, human immunodeficiency virus [HIV], etc.), or due to medications or treatments (example: therapies used to treat cancer and rheumatological diseases). However, even if you do not have one on these, it can still happen. If you have any of these conditions, or take one of these drugs, make sure to notify your treating physician. Nerve Damage: This is more common when the treatment is an invasive one, but it can also happen with the use of medications, such as those used in the treatment of cancer. The damage can occur to small secondary nerves, or to large primary ones, such as those in the spinal cord and brain. This damage may be temporary or permanent and it may lead to impairments that can range from temporary numbness to permanent paralysis and/or brain death. Allergic Reactions: Any time a substance or material comes in contact with our body, there is the possibility of an allergic reaction. These can range from a mild skin rash (contact dermatitis) to a severe systemic reaction (anaphylactic reaction), which can result in death. Death: In general, any medical intervention can result in death, most of the time due to an unforeseen complication. Stop Aspirin 11 days before procedure. ____________________________________________________________________________________________

## 2022-03-10 NOTE — Progress Notes (Signed)
PROVIDER NOTE: Information contained herein reflects review and annotations entered in association with encounter. Interpretation of such information and data should be left to medically-trained personnel. Information provided to patient can be located elsewhere in the medical record under "Patient Instructions". Document created using STT-dictation technology, any transcriptional errors that may result from process are unintentional.    Patient: Brandon Bright  Service Category: E/M  Provider: Gaspar Cola, MD  DOB: September 14, 1946  DOS: 03/10/2022  Specialty: Interventional Pain Management  MRN: [id number]  Setting: Ambulatory outpatient  PCP: Donnamarie Rossetti, PA-C  Type: Established Patient    Referring Provider: Donnamarie Rossetti,*  Location: Office  Delivery: Face-to-face     HPI  [name], a 76 y.o. year old male, is here today because of his Chronic pain of right lower extremity [M79.604, G89.29]. [name] primary complain today is Leg Pain (Right, lower) Last encounter: My last encounter with him was on Visit date not found. Pertinent problems: 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 Pain Assessment: Severity of Chronic pain is reported as a 10-Worst pain ever/10. Location: Leg Right, Mid/ . Onset: More than a month ago. Quality: Dull. Timing: Intermittent. Modifying factor(s): sitting/not standing. Vitals:  height is _0  (1.778 m) and weight is 220 lb (99.8 kg). His temporal temperature is 97.5 F (36.4 C) (abnormal). His blood pressure is 144/79 (abnormal) and his pulse is 75. His respiration is 14 and oxygen saturation is 96%.   Reason for encounter: evaluation of worsening, or previously known (established) problem.  This patient was last contacted on 06/29/2021.  On 09/09/2020 he had a left IA hip joint injection with 100% relief of his pain which was ongoing by his postprocedure evaluation on 09/24/2020.  Last lumbar MRI for this patient was done on 04/14/2009.  The patient comes into the clinic today complaining of lower extremity pain and low back pain where the lower extremity pain is significantly worse than the low back pain.  Both of them appear to be on the right side and he describes the pain in the right lower extremity to run down the lateral aspect of the upper leg and crosses over the middle of his knee and stops midway down his shin.  He denies any pain, numbness, or weakness of the lower extremity.  Today he was able to toe walk and heel walk without any problems.  He also had a negative provocative hyperextension and rotation maneuver.  Patrick maneuver was also negative bilaterally.  Review of the medical record shows that the patient had an MRI of the lumbar spine last done on 04/14/2009 (13 years ago), which was done secondary to pain in the lower back and on the right lower extremity.  The MRI showed multilevel facet arthropathy starting  at the L2-3 level and going all the way down to L5-S1.  At that time the study showed severe right-sided L3-4 foraminal stenosis; an L4-5 disc bulge with a  right paracentral disc component abutting the extraforaminal right L4 nerve root with severe right foraminal narrowing at the L4-5 level.  Based on the patient's symptoms, the solution of the pain, physical exam, and available imaging, I will be scheduling him to return for a right-sided L3 and L4 transforaminal ESI.  However because of the fact that the MRI is 78 years old, today I will be ordering flexion-extension views of the lumbar spine as well as a repeat MRI of the lumbar spine.  The plan was shared with the patient who understood and accepted.  The patient takes 325 mg of aspirin every day and he has been instructed to stop it for about 11 days prior to the procedure.  Pharmacotherapy Assessment  Analgesic: No opioid analgesics prescribed by our practice.   Monitoring: [place] PMP: PDMP not reviewed this encounter.       Pharmacotherapy: No side-effects or adverse reactions reported. Compliance: No problems identified. Effectiveness: Clinically acceptable.  No notes on file  UDS:  No results found for: "SUMMARY"   ROS  Constitutional: Denies any fever or chills Gastrointestinal: No reported hemesis, hematochezia, vomiting, or acute GI distress Musculoskeletal: Denies any acute onset joint swelling, redness, loss of ROM, or weakness Neurological: No reported episodes of acute onset apraxia, aphasia, dysarthria, agnosia, amnesia, paralysis, loss of coordination, or loss of consciousness  Medication Review  Centrum Silver 50+Men, Hypertonic Nasal Wash, Melatonin, Multiple Vitamins-Minerals, amLODipine, aspirin, cholecalciferol, fluticasone, gabapentin, ipratropium, latanoprost, simvastatin, and valsartan-hydrochlorothiazide  History Review  Allergy: [name] is allergic to codeine, vicodin [hydrocodone-acetaminophen], celebrex [celecoxib], and ultram [tramadol]. Drug: [name]  reports no history of drug use. Alcohol:  reports no history of alcohol use. Tobacco:  reports that he  has never smoked. He has never used smokeless tobacco. Social: [name]  reports that he has never smoked. He has never used smokeless tobacco. He reports that he does not drink alcohol and does not use drugs. 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 Surgical: [name]  has a past surgical history that includes ganglion cyst removal (Right); Cataract extraction w/PHACO (Left, 02/04/2016); Colonoscopy; Pilonidal cyst excision; Cardiac catheterization (1999?); Cataract extraction w/PHACO (Right, 03/03/2016); Eye surgery (May and June); and Colonoscopy with propofol (N/A, 06/05/2018). Family: family history includes Dementia in his mother; Diabetes in his brother; Heart disease in his brother, brother, and father.  Laboratory Chemistry Profile   Renal Lab Results  Component Value Date   BUN 16 08/17/2014   CREATININE 1.10 10/25/2018   GFRAA >60 08/17/2014   GFRNONAA >60 08/17/2014    Hepatic Lab Results  Component Value Date   AST 15 08/17/2014   ALT 20 08/17/2014   ALBUMIN 2.7 (L) 08/17/2014   ALKPHOS 43 (L) 08/17/2014   LIPASE 75 08/16/2014    Electrolytes Lab Results  Component Value Date   NA 144 08/17/2014   K 3.3 (L) 08/17/2014   CL 111 (H) 08/17/2014   CALCIUM 7.5 (L) 08/17/2014   MG 1.7 (L) 08/16/2014    Bone No results found for: "VD25OH", "VD125OH2TOT", "XB2841LK4", "MW1027OZ3", "25OHVITD1", "25OHVITD2", "25OHVITD3", "TESTOFREE", "TESTOSTERONE"  Inflammation (CRP: Acute Phase) (ESR:  Chronic Phase) Lab Results  Component Value Date   CRP 2 08/28/2020   ESRSEDRATE 26 08/28/2020         Note: Above Lab results reviewed.  Recent Imaging Review  DG Chest 2  View CLINICAL DATA:  Fatigue, cough  EXAM: CHEST - 2 VIEW  COMPARISON:  Chest x-ray 05/09/2011  FINDINGS: Heart size and mediastinal contours are within normal limits. No focal consolidation identified. Small linear opacity at the left lung base may represent scarring or platelike atelectasis.  No pleural effusion or pneumothorax visualized.  No acute osseous abnormality appreciated.  IMPRESSION: Small scarring or platelike atelectasis at the left lung base.  Electronically Signed   By: Ofilia Neas M.D.   On: 09/17/2021 13:15 Note: Reviewed        Physical Exam  General appearance: Well nourished, well developed, and well hydrated. In no apparent acute distress Mental status: Alert, oriented x 3 (person, place, & time)       Respiratory: No evidence of acute respiratory distress Eyes: PERLA Vitals: BP (!) 144/79   Pulse 75   Temp (!) 97.5 F (36.4 C) (Temporal)   Resp 14   Ht _0  (1.778 m)   Wt 220 lb (99.8 kg)   SpO2 96%   BMI 31.57 kg/m  BMI: Estimated body mass index is 31.57 kg/m as calculated from the following:   Height as of this encounter: _1  (1.778 m).   Weight as of this encounter: 220 lb (99.8 kg). Ideal: Ideal body weight: 73 kg (160 lb 15 oz) Adjusted ideal body weight: 83.7 kg (184 lb 9 oz)  Assessment   Diagnosis Status  1. Chronic lower extremity pain (1ry area of Pain) (Right)   2. Lumbar radiculitis (Right) (L3 & L4)   3. 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 DDD (degenerative disc disease), lumbar   6. Lumbar disc protrusion (Left: L2-3) (Central: L5-S1)   7. Lumbar discogenic pain syndrome   8. Lumbar facet arthropathy (Bilateral)   9. Osteoarthritis of hip (Left)   10. Lumbosacral radiculopathy at L4 (Right)   11. Abnormal MRI, lumbar spine (04/14/2009)   12. Current use of aspirin (325 mg)    Worsened Recurring Worsening    Updated Problems: Problem  Lumbosacral radiculopathy at L4 (Right)  Abnormal MRI, lumbar spine (04/14/2009)   FINDINGS:    The vertebral bodies of the lumbar spine are normal in size and alignment.  There is normal bone marrow signal demonstrated throughout the vertebra.  There is disc desiccation with disc height loss throughout the lumbar  spine.    The spinal cord is of normal volume and contour. The cord terminates  normally at L1 . The nerve roots of the cauda equina and the filum  terminale  have the usual appearance.    The visualized portions of the SI joints are unremarkable.    The imaged intra-abdominal contents are unremarkable.    T12-L1: No significant disc bulge. No evidence of neural foraminal or  central stenosis.    L1-L2: Mild broad-based disc bulge with a left paracentral predominance.  No  evidence of neural foraminal or central stenosis.    L2-L3: There is a mild broad-based disc bulge with a superimposed mild  left  paracentral disc protrusion impressing on the thecal sac and with mild  mass  effect upon the left intrathecal nerve roots. There  is moderate spinal  stenosis. There is no significant foraminal stenosis.    L3-L4: A moderate broad-based disc bulge with flattening of the ventral  thecal sac. Moderate bilateral facet arthropathy. Moderate central canal  stenosis. There is severe right foraminal narrowing. There is no left  foraminal narrowing.    L4-L5: Moderate broad-based disc bulge with a right paracentral disc  component abutting the extraforaminal right L4 nerve root. There is severe  right foraminal narrowing. There is no significant left foraminal  narrowing.  There is mild bilateral facet arthropathy with ligamentum flavum infolding  resulting in moderate spinal stenosis.    L5-S1: There is a moderate sized central disc protrusion with impressing  on  the ventral thecal sac. No evidence of neural foraminal or central   stenosis.    IMPRESSION:    1. Lumbar spine spondylosis as described above without significant  interval  change compared with the prior exam   Chronic midline low back pain (2ry area of Pain) w/ sciatica (Right)  Chronic neck pain (3ry area of Pain) (Left)  Chronic lower extremity pain (1ry area of Pain) (Right)  Current use of aspirin (325 mg)  Hypokalemia (Resolved)  Arf (Acute Renal Failure) ([facility]) (Resolved)  Age (Acute Gastroenteritis) (Resolved)    Plan of Care  Problem-specific:  No problem-specific Assessment & Plan notes found for this encounter.  Brandon Bright has a current medication list which includes the following long-term medication(s): amlodipine, fluticasone, gabapentin, hypertonic nasal wash, ipratropium, simvastatin, and valsartan-hydrochlorothiazide.  Pharmacotherapy (Medications Ordered): No orders of the defined types were placed in this encounter.  Orders:  Orders Placed This Encounter  Procedures   DG Lumbar Spine Complete W/Bend    Patient presents with axial pain with possible radicular component. Please assist [place] in identifying specific level(s) and laterality of any additional findings such as: 1. Facet (Zygapophyseal) joint DJD (Hypertrophy, space narrowing, subchondral sclerosis, and/or osteophyte formation) 2. DDD and/or IVDD (Loss of disc height, desiccation, gas patterns, osteophytes, endplate sclerosis, or "Black disc disease") 3. Pars defects 4. Spondylolisthesis, spondylosis, and/or spondyloarthropathies (include Degree/Grade of displacement in mm) (stability) 5. Vertebral body Fractures (acute/chronic) (state percentage of collapse) 6. Demineralization (osteopenia/osteoporotic) 7. Bone pathology 8. Foraminal narrowing  9. Surgical changes    Standing Status:   Future    Number of Occurrences:   1    Standing Expiration Date:   04/09/2022    Scheduling Instructions:     Imaging must be done as soon as possible. Inform patient  that order will expire within 30 days and I will not renew it.    Order Specific Question:   Reason for Exam (SYMPTOM  OR DIAGNOSIS REQUIRED)    Answer:   Low back pain    Order Specific Question:   Preferred imaging location?    Answer:   [facility]    Order Specific Question:   Call Results- Best Contact Number?    Answer:   [phone] ([facility])    Order Specific Question:   Radiology Contrast Protocol - do NOT remove file path    Answer:   [email or web address]    Order Specific Question:   Release to patient    Answer:   Immediate   MR LUMBAR SPINE WO CONTRAST    Patient presents with axial pain with possible radicular component. Please assist [place] in identifying specific level(s) and laterality of any additional findings such as: 1. Facet (Zygapophyseal) joint DJD (Hypertrophy, space narrowing, subchondral sclerosis, and/or osteophyte  formation) 2. DDD and/or IVDD (Loss of disc height, desiccation, gas patterns, osteophytes, endplate sclerosis, or "Black disc disease") 3. Pars defects 4. Spondylolisthesis, spondylosis, and/or spondyloarthropathies (include Degree/Grade of displacement in mm) (stability) 5. Vertebral body Fractures (acute/chronic) (state percentage of collapse) 6. Demineralization (osteopenia/osteoporotic) 7. Bone pathology 8. Foraminal narrowing  9. Surgical changes 10. Central, Lateral Recess, and/or Foraminal Stenosis (include AP diameter of stenosis in mm) 11. Surgical changes (hardware type, status, and presence of fibrosis) 12. Modic Type Changes (MRI only) 13. IVDD (Disc bulge, protrusion, herniation, extrusion) (Level, laterality, extent)    Standing Status:   Future    Standing Expiration Date:   04/09/2022    Scheduling Instructions:     Imaging must be done as soon as possible. Inform patient that order will expire within 30 days and I will not renew it.    Order Specific Question:   What is the  patient's sedation requirement?    Answer:   No Sedation    Order Specific Question:   Does the patient have a pacemaker or implanted devices?    Answer:   No    Order Specific Question:   Preferred imaging location?    Answer:   [facility] (table limit-350lbs)    Order Specific Question:   Call Results- Best Contact Number?    Answer:   [phone] ([facility])    Order Specific Question:   Radiology Contrast Protocol - do NOT remove file path    Answer:   \\[email or web address]   Blood Thinner Instructions to Nursing    Always make sure patient has clearance from prescribing physician to stop blood thinners for interventional therapies. If the patient requires a Lovenox-bridge therapy, make sure arrangements are made to institute it with the assistance of the PCP.    Scheduling Instructions:     Have [name] stop the ASA (>74m) x 11 days prior to procedure or surgery.   Follow-up plan:   Return for (Clinic) procedure: (R) L3 & L4 TFESI #1, (Blood Thinner Protocol).     Interventional Therapies  Risk  Complexity Considerations:   Estimated body mass index is 31.57 kg/m as calculated from the following:   Height as of this encounter: _0  (1.778 m).   Weight as of this encounter: 220 lb (99.8 kg). WNL   Planned  Pending:   Diagnostic/therapeutic right L4 TFESI #1    Under consideration:   Diagnostic right L3 TFESI  Diagnostic right L4 TFESI  Diagnostic bilateral lumbar facet MBB  Diagnostic Trigger point injection  Diagnostic  bilateral cervical facet MBB     Completed:   Diagnostic/therapeutic left IA hip joint injection x1 (09/09/2020) (100/100/100/100)    Therapeutic  Palliative (PRN) options:  Palliative/therapeutic left IA hip joint injection #2  Palliative right L4-5 LESI (none done by me) The patient indicated having had LESI x 4, by Dr. CPrimus Bravo (before 2000) (Procedure described as helpful).      Recent Visits No  visits were found meeting these conditions. Showing recent visits within past 90 days and meeting all other requirements Today's Visits Date Type Provider Dept  03/10/22 Office Visit NMilinda Pointer MD [facility]  Showing today's visits and meeting all other requirements Future Appointments No visits were found meeting these conditions. Showing future appointments within next 90 days and meeting all other requirements  I discussed the assessment and treatment plan with the patient. The patient was provided an opportunity to ask questions and all were answered. The  patient agreed with the plan and demonstrated an understanding of the instructions.  Patient advised to call back or seek an in-person evaluation if the symptoms or condition worsens.  Duration of encounter: 30 minutes.  Note by: Brandon Cola, MD Date: 03/10/2022; Time: 4:08 PM

## 2022-03-12 ENCOUNTER — Ambulatory Visit: Payer: Medicare PPO

## 2022-03-20 ENCOUNTER — Ambulatory Visit
Admission: RE | Admit: 2022-03-20 | Discharge: 2022-03-20 | Disposition: A | Discharge status: Home / Self Care | Payer: Medicare PPO | Source: Ambulatory Visit | Attending: Pain Medicine | Admitting: Pain Medicine

## 2022-03-20 DIAGNOSIS — M79604 Pain in right leg: Secondary | ICD-10-CM | POA: Diagnosis present

## 2022-03-20 DIAGNOSIS — M5416 Radiculopathy, lumbar region: Secondary | ICD-10-CM | POA: Diagnosis present

## 2022-03-20 DIAGNOSIS — M48061 Spinal stenosis, lumbar region without neurogenic claudication: Secondary | ICD-10-CM | POA: Diagnosis present

## 2022-03-20 DIAGNOSIS — M5441 Lumbago with sciatica, right side: Secondary | ICD-10-CM | POA: Diagnosis present

## 2022-03-20 DIAGNOSIS — G8929 Other chronic pain: Secondary | ICD-10-CM

## 2022-03-20 DIAGNOSIS — M5126 Other intervertebral disc displacement, lumbar region: Secondary | ICD-10-CM | POA: Diagnosis present

## 2022-03-20 DIAGNOSIS — M47816 Spondylosis without myelopathy or radiculopathy, lumbar region: Secondary | ICD-10-CM | POA: Diagnosis present

## 2022-03-20 DIAGNOSIS — M5136 Other intervertebral disc degeneration, lumbar region: Secondary | ICD-10-CM

## 2022-03-22 ENCOUNTER — Telehealth: Payer: Self-pay

## 2022-03-22 ENCOUNTER — Other Ambulatory Visit: Payer: Self-pay | Admitting: Pain Medicine

## 2022-03-22 DIAGNOSIS — M5136 Other intervertebral disc degeneration, lumbar region: Secondary | ICD-10-CM

## 2022-03-22 DIAGNOSIS — M48061 Spinal stenosis, lumbar region without neurogenic claudication: Secondary | ICD-10-CM

## 2022-03-22 DIAGNOSIS — R937 Abnormal findings on diagnostic imaging of other parts of musculoskeletal system: Secondary | ICD-10-CM

## 2022-03-22 DIAGNOSIS — M5417 Radiculopathy, lumbosacral region: Secondary | ICD-10-CM

## 2022-03-22 DIAGNOSIS — M47816 Spondylosis without myelopathy or radiculopathy, lumbar region: Secondary | ICD-10-CM

## 2022-03-22 DIAGNOSIS — M5126 Other intervertebral disc displacement, lumbar region: Secondary | ICD-10-CM

## 2022-03-22 DIAGNOSIS — G8929 Other chronic pain: Secondary | ICD-10-CM

## 2022-03-22 DIAGNOSIS — M5416 Radiculopathy, lumbar region: Secondary | ICD-10-CM

## 2022-03-22 NOTE — Telephone Encounter (Signed)
Note to Dr. Laban Emperor to place order.

## 2022-03-22 NOTE — Telephone Encounter (Signed)
The patients MRI results are in. In his last check out Dr. Shireen Quan said bring him in for a TFESI. There is no order for this. Please put in order so I can get prior auth.

## 2022-03-31 NOTE — Progress Notes (Unsigned)
PROVIDER NOTE: Interpretation of information contained herein should be left to medically-trained personnel. Specific patient instructions are provided elsewhere under "Patient Instructions" section of medical record. This document was created in part using STT-dictation technology, any transcriptional errors that may result from this process are unintentional.  Patient: Brandon Bright Type: Established DOB: [birth year] MRN: [id number] PCP: Wilford Corner, PA-C  Service: Procedure DOS: 04/01/2022 Setting: Ambulatory Location: Ambulatory outpatient facility Delivery: Face-to-face Provider: Oswaldo Done, MD Specialty: Interventional Pain Management Specialty designation: 09 Location: Outpatient facility Ref. Prov.: Delano Metz, MD    Primary Reason for Visit: Interventional Pain Management Treatment. CC: No chief complaint on file.   Procedure:           Type: Lumbar trans-foraminal epidural steroid injection (L-TFESI) #1  Laterality: Right (-RT)  Level: L4 nerve root(s) Imaging: Fluoroscopy-guided Anesthesia: Local anesthesia (1-2% Lidocaine) Anxiolysis: None                 Sedation: None. DOS: 04/01/2022  Performed by: Oswaldo Done, MD  Purpose: Diagnostic/Therapeutic Indications: Lumbar radicular pain severe enough to impact quality of life or function. 1. Lumbosacral radiculopathy at L4 (Right)   2. 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 Lumbar radiculitis (Right) (L3 & L4)   4. Chronic lower extremity pain (1ry area of Pain) (Right)   5. DDD (degenerative disc disease), lumbar    NAS-11 Pain score:   Pre-procedure:  /10   Post-procedure:  /10     Position / Prep / Materials:  Position: Prone  Prep solution: DuraPrep (Iodine Povacrylex [0.7% available iodine] and Isopropyl Alcohol, 74% w/w) Prep Area: Entire Posterior Lumbosacral Area.  From the lower tip of the scapula down to the tailbone and from flank to  flank. Materials:  Tray: Block Needle(s):  Type: Spinal  Gauge (G): 22  Length: 5-in  Qty: 1  Pre-op H&P Assessment:  Brandon Bright is a 76 y.o. (year old), male patient, seen today for interventional treatment. He  has a past surgical history that includes ganglion cyst removal (Right); Cataract extraction w/PHACO (Left, 02/04/2016); Colonoscopy; Pilonidal cyst excision; Cardiac catheterization (1999?); Cataract extraction w/PHACO (Right, 03/03/2016); Eye surgery (May and June); and Colonoscopy with propofol (N/A, 06/05/2018). Brandon Bright has a current medication list which includes the following prescription(s): amlodipine, aspirin, cholecalciferol, fluticasone, gabapentin, hypertonic nasal wash, ipratropium, latanoprost, melatonin, centrum silver 50+men, multiple vitamins-minerals, simvastatin, and valsartan-hydrochlorothiazide. His primarily concern today is the No chief complaint on file.  Initial Vital Signs:  Pulse/HCG Rate:    Temp:   Resp:   BP:   SpO2:    BMI: Estimated body mass index is 31.57 kg/m as calculated from the following:   Height as of 03/10/22: 5\' 10"  (1.778 m).   Weight as of 03/10/22: 220 lb (99.8 kg).  Risk Assessment: Allergies: Reviewed. He is allergic to codeine, vicodin [hydrocodone-acetaminophen], celebrex [celecoxib], and ultram [tramadol].  Allergy Precautions: None required Coagulopathies: Reviewed. None identified.  Blood-thinner therapy: None at this time Active Infection(s): Reviewed. None identified. Brandon Bright is afebrile  Site Confirmation: Brandon Bright was asked to confirm the procedure and laterality before marking the site Procedure checklist: Completed Consent: Before the procedure and under the influence of no sedative(s), amnesic(s), or anxiolytics, the patient was informed of the treatment options, risks and possible complications. To fulfill our ethical and legal obligations, as recommended by the American Medical Association's Code of Ethics, I  have informed the patient of my clinical impression; the nature  and purpose of the treatment or procedure; the risks, benefits, and possible complications of the intervention; the alternatives, including doing nothing; the risk(s) and benefit(s) of the alternative treatment(s) or procedure(s); and the risk(s) and benefit(s) of doing nothing. The patient was provided information about the general risks and possible complications associated with the procedure. These may include, but are not limited to: failure to achieve desired goals, infection, bleeding, organ or nerve damage, allergic reactions, paralysis, and death. In addition, the patient was informed of those risks and complications associated to Spine-related procedures, such as failure to decrease pain; infection (i.e.: Meningitis, epidural or intraspinal abscess); bleeding (i.e.: epidural hematoma, subarachnoid hemorrhage, or any other type of intraspinal or peri-dural bleeding); organ or nerve damage (i.e.: Any type of peripheral nerve, nerve root, or spinal cord injury) with subsequent damage to sensory, motor, and/or autonomic systems, resulting in permanent pain, numbness, and/or weakness of one or several areas of the body; allergic reactions; (i.e.: anaphylactic reaction); and/or death. Furthermore, the patient was informed of those risks and complications associated with the medications. These include, but are not limited to: allergic reactions (i.e.: anaphylactic or anaphylactoid reaction(s)); adrenal axis suppression; blood sugar elevation that in diabetics may result in ketoacidosis or comma; water retention that in patients with history of congestive heart failure may result in shortness of breath, pulmonary edema, and decompensation with resultant heart failure; weight gain; swelling or edema; medication-induced neural toxicity; particulate matter embolism and blood vessel occlusion with resultant organ, and/or nervous system infarction;  and/or aseptic necrosis of one or more joints. Finally, the patient was informed that Medicine is not an exact science; therefore, there is also the possibility of unforeseen or unpredictable risks and/or possible complications that may result in a catastrophic outcome. The patient indicated having understood very clearly. We have given the patient no guarantees and we have made no promises. Enough time was given to the patient to ask questions, all of which were answered to the patient's satisfaction. Mr. Taborda has indicated that he wanted to continue with the procedure. Attestation: I, the ordering provider, attest that I have discussed with the patient the benefits, risks, side-effects, alternatives, likelihood of achieving goals, and potential problems during recovery for the procedure that I have provided informed consent. Date  Time: {CHL [facility]-PAIN TIME CHOICES:21018001}  Pre-Procedure Preparation:  Monitoring: As per clinic protocol. Respiration, ETCO2, SpO2, BP, heart rate and rhythm monitor placed and checked for adequate function Safety Precautions: Patient was assessed for positional comfort and pressure points before starting the procedure. Time-out: I initiated and conducted the "Time-out" before starting the procedure, as per protocol. The patient was asked to participate by confirming the accuracy of the "Time Out" information. Verification of the correct person, site, and procedure were performed and confirmed by me, the nursing staff, and the patient. "Time-out" conducted as per Joint Commission's Universal Protocol (UP.01.01.01). Time:    Description/Narrative of Procedure:          Target: The 6 o'clock position under the pedicle, on the affected side. Region: Posterolateral Lumbosacral Approach: Posterior Percutaneous Paravertebral approach.  Rationale (medical necessity): procedure needed and proper for the diagnosis and/or treatment of the patient's medical symptoms and  needs. Procedural Technique Safety Precautions: Aspiration looking for blood return was conducted prior to all injections. At no point did we inject any substances, as a needle was being advanced. No attempts were made at seeking any paresthesias. Safe injection practices and needle disposal techniques used. Medications properly checked for expiration dates.  SDV (single dose vial) medications used. Description of the Procedure: Protocol guidelines were followed. The patient was placed in position over the procedure table. The target area was identified and the area prepped in the usual manner. Skin & deeper tissues infiltrated with local anesthetic. Appropriate amount of time allowed to pass for local anesthetics to take effect. The procedure needles were then advanced to the target area. Proper needle placement secured. Negative aspiration confirmed. Solution injected in intermittent fashion, asking for systemic symptoms every 0.5cc of injectate. The needles were then removed and the area cleansed, making sure to leave some of the prepping solution back to take advantage of its long term bactericidal properties.  There were no vitals filed for this visit.  Start Time:   hrs. End Time:   hrs.  Imaging Guidance (Spinal):          Type of Imaging Technique: Fluoroscopy Guidance (Spinal) Indication(s): Assistance in needle guidance and placement for procedures requiring needle placement in or near specific anatomical locations not easily accessible without such assistance. Exposure Time: Please see nurses notes. Contrast: Before injecting any contrast, we confirmed that the patient did not have an allergy to iodine, shellfish, or radiological contrast. Once satisfactory needle placement was completed at the desired level, radiological contrast was injected. Contrast injected under live fluoroscopy. No contrast complications. See chart for type and volume of contrast used. Fluoroscopic Guidance: I was  personally present during the use of fluoroscopy. "Tunnel Vision Technique" used to obtain the best possible view of the target area. Parallax error corrected before commencing the procedure. "Direction-depth-direction" technique used to introduce the needle under continuous pulsed fluoroscopy. Once target was reached, antero-posterior, oblique, and lateral fluoroscopic projection used confirm needle placement in all planes. Images permanently stored in EMR. Interpretation: I personally interpreted the imaging intraoperatively. Adequate needle placement confirmed in multiple planes. Appropriate spread of contrast into desired area was observed. No evidence of afferent or efferent intravascular uptake. No intrathecal or subarachnoid spread observed. Permanent images saved into the patient's record.  Antibiotic Prophylaxis:   Anti-infectives (From admission, onward)    None      Indication(s): None identified  Post-operative Assessment:  Post-procedure Vital Signs:  Pulse/HCG Rate:    Temp:   Resp:   BP:   SpO2:    EBL: None  Complications: No immediate post-treatment complications observed by team, or reported by patient.  Note: The patient tolerated the entire procedure well. A repeat set of vitals were taken after the procedure and the patient was kept under observation following institutional policy, for this type of procedure. Post-procedural neurological assessment was performed, showing return to baseline, prior to discharge. The patient was provided with post-procedure discharge instructions, including a section on how to identify potential problems. Should any problems arise concerning this procedure, the patient was given instructions to immediately contact us, at any time, without hesitation. In any case, we plan to contact the patient by telephone for a follow-up status report regarding this interventional procedure.  Comments:  No additional relevant information.  Plan of Care   Orders:  No orders of the defined types were placed in this encounter.  Chronic Opioid Analgesic:  No opioid analgesics prescribed by our practice.   Medications ordered for procedure: No orders of the defined types were placed in this encounter.  Medications administered: Axil Copeman. Backes "Rosanne Ashing" had no medications administered during this visit.  See the medical record for exact dosing, route, and time of administration.  Follow-up plan:  No follow-ups on file.       Interventional Therapies  Risk  Complexity Considerations:   Estimated body mass index is 31.57 kg/m as calculated from the following:   Height as of this encounter: 5\' 10"  (1.778 m).   Weight as of this encounter: 220 lb (99.8 kg). WNL   Planned  Pending:   Diagnostic/therapeutic right L4 TFESI #1    Under consideration:   Diagnostic right L3 TFESI  Diagnostic right L4 TFESI  Diagnostic bilateral lumbar facet MBB  Diagnostic Trigger point injection  Diagnostic  bilateral cervical facet MBB     Completed:   Diagnostic/therapeutic left IA hip joint injection x1 (09/09/2020) (100/100/100/100)    Therapeutic  Palliative (PRN) options:  Palliative/therapeutic left IA hip joint injection #2  Palliative right L4-5 LESI (none done by me) The patient indicated having had LESI x 4, by Dr. 09/11/2020. (before 2000) (Procedure described as helpful).       Recent Visits Date Type Provider Dept  03/10/22 Office Visit 03/12/22, MD [facility]  Showing recent visits within past 90 days and meeting all other requirements Future Appointments Date Type Provider Dept  04/01/22 Appointment 06/02/22, MD [facility]  06/23/22 Appointment 06/25/22, MD [facility]  Showing future appointments within next 90 days and meeting all other requirements  Disposition: Discharge home  Discharge (Date  Time): 04/01/2022;   hrs.   Primary Care Physician: 06/02/2022, PA-C Location: [facility] Note by: [facility], MD Date: 04/01/2022; Time: 4:15 PM  Disclaimer:  Medicine is not an 06/02/2022. The only guarantee in medicine is that nothing is guaranteed. It is important to note that the decision to proceed with this intervention was based on the information collected from the patient. The Data and conclusions were drawn from the patient's questionnaire, the interview, and the physical examination. Because the information was provided in large part by the patient, it cannot be guaranteed that it has not been purposely or unconsciously manipulated. Every effort has been made to obtain as much relevant data as possible for this evaluation. It is important to note that the conclusions that lead to this procedure are derived in large part from the available data. Always take into account that the treatment will also be dependent on availability of resources and existing treatment guidelines, considered by other Pain Management Practitioners as being common knowledge and practice, at the time of the intervention. For Medico-Legal purposes, it is also important to point out that variation in procedural techniques and pharmacological choices are the acceptable norm. The indications, contraindications, technique, and results of the above procedure should only be interpreted and judged by a Board-Certified Interventional Pain Specialist with extensive familiarity and expertise in the same exact procedure and technique.

## 2022-04-01 ENCOUNTER — Ambulatory Visit
Admission: RE | Admit: 2022-04-01 | Discharge: 2022-04-01 | Disposition: A | Discharge status: Home / Self Care | Payer: Medicare PPO | Source: Ambulatory Visit | Attending: Pain Medicine | Admitting: Pain Medicine

## 2022-04-01 ENCOUNTER — Encounter: Payer: Self-pay | Admitting: Pain Medicine

## 2022-04-01 ENCOUNTER — Ambulatory Visit: Payer: Medicare PPO | Attending: Pain Medicine | Admitting: Pain Medicine

## 2022-04-01 VITALS — BP 105/69 | HR 68 | Temp 97.2°F | Resp 15 | Ht 70.0 in | Wt 220.0 lb

## 2022-04-01 DIAGNOSIS — M5416 Radiculopathy, lumbar region: Secondary | ICD-10-CM | POA: Diagnosis present

## 2022-04-01 DIAGNOSIS — M5441 Lumbago with sciatica, right side: Secondary | ICD-10-CM | POA: Diagnosis present

## 2022-04-01 DIAGNOSIS — G8929 Other chronic pain: Secondary | ICD-10-CM | POA: Insufficient documentation

## 2022-04-01 DIAGNOSIS — M5126 Other intervertebral disc displacement, lumbar region: Secondary | ICD-10-CM | POA: Diagnosis present

## 2022-04-01 DIAGNOSIS — M47816 Spondylosis without myelopathy or radiculopathy, lumbar region: Secondary | ICD-10-CM | POA: Diagnosis present

## 2022-04-01 DIAGNOSIS — M79604 Pain in right leg: Secondary | ICD-10-CM | POA: Diagnosis present

## 2022-04-01 DIAGNOSIS — M5136 Other intervertebral disc degeneration, lumbar region: Secondary | ICD-10-CM | POA: Insufficient documentation

## 2022-04-01 DIAGNOSIS — R937 Abnormal findings on diagnostic imaging of other parts of musculoskeletal system: Secondary | ICD-10-CM | POA: Diagnosis present

## 2022-04-01 DIAGNOSIS — M5417 Radiculopathy, lumbosacral region: Secondary | ICD-10-CM

## 2022-04-01 DIAGNOSIS — M48061 Spinal stenosis, lumbar region without neurogenic claudication: Secondary | ICD-10-CM | POA: Insufficient documentation

## 2022-04-01 MED ORDER — LIDOCAINE HCL 2 % IJ SOLN
20.0000 mL | Freq: Once | INTRAMUSCULAR | Status: AC
  Administered 2022-04-01: 400 mg

## 2022-04-01 MED ORDER — ROPIVACAINE HCL 2 MG/ML IJ SOLN
1.0000 mL | Freq: Once | INTRAMUSCULAR | Status: AC
  Administered 2022-04-01: 1 mL via EPIDURAL

## 2022-04-01 MED ORDER — DEXAMETHASONE SODIUM PHOSPHATE 10 MG/ML IJ SOLN
INTRAMUSCULAR | Status: AC
  Filled 2022-04-01: qty 1

## 2022-04-01 MED ORDER — LACTATED RINGERS IV SOLN: Freq: Once | INTRAVENOUS | Status: AC

## 2022-04-01 MED ORDER — MIDAZOLAM HCL 5 MG/5ML IJ SOLN
0.5000 mg | Freq: Once | INTRAMUSCULAR | Status: AC
  Administered 2022-04-01: 1 mg via INTRAVENOUS

## 2022-04-01 MED ORDER — MIDAZOLAM HCL 5 MG/5ML IJ SOLN
INTRAMUSCULAR | Status: AC
  Filled 2022-04-01: qty 5

## 2022-04-01 MED ORDER — LIDOCAINE HCL 2 % IJ SOLN
INTRAMUSCULAR | Status: AC
  Filled 2022-04-01: qty 20

## 2022-04-01 MED ORDER — DEXAMETHASONE SODIUM PHOSPHATE 10 MG/ML IJ SOLN
10.0000 mg | Freq: Once | INTRAMUSCULAR | Status: AC
  Administered 2022-04-01: 10 mg

## 2022-04-01 MED ORDER — PENTAFLUOROPROP-TETRAFLUOROETH EX AERO
INHALATION_SPRAY | Freq: Once | CUTANEOUS | Status: DC
  Filled 2022-04-01: qty 116

## 2022-04-01 MED ORDER — SODIUM CHLORIDE (PF) 0.9 % IJ SOLN
INTRAMUSCULAR | Status: AC
  Filled 2022-04-01: qty 10

## 2022-04-01 MED ORDER — ROPIVACAINE HCL 2 MG/ML IJ SOLN
INTRAMUSCULAR | Status: AC
  Filled 2022-04-01: qty 20

## 2022-04-01 MED ORDER — IOHEXOL 180 MG/ML  SOLN
INTRAMUSCULAR | Status: AC
  Filled 2022-04-01: qty 20

## 2022-04-01 MED ORDER — SODIUM CHLORIDE 0.9% FLUSH
1.0000 mL | Freq: Once | INTRAVENOUS | Status: AC
  Administered 2022-04-01: 1 mL

## 2022-04-01 MED ORDER — IOHEXOL 180 MG/ML  SOLN
10.0000 mL | Freq: Once | INTRAMUSCULAR | Status: AC
  Administered 2022-04-01: 10 mL via INTRATHECAL

## 2022-04-01 NOTE — Progress Notes (Signed)
Safety precautions to be maintained throughout the outpatient stay will include: orient to surroundings, keep bed in low position, maintain call bell within reach at all times, provide assistance with transfer out of bed and ambulation.  

## 2022-04-01 NOTE — Patient Instructions (Signed)
____________________________________________________________________________________________  Virtual Visits   What is a "Virtual Visit"? It is a healthcare communication encounter (medical visit) that takes place on real time (NOT TEXT or E-MAIL) over the telephone or computer device (desktop, laptop, tablet, smart phone, etc.). It allows for more location flexibility between the patient and the healthcare provider.  Who decides when these types of visits will be used? The physician.  Who is eligible for these types of visits? Only those patients that can be reliably reached over the telephone.  What do you mean by reliably? We do not have time to call everyone multiple times, therefore those that tend to screen calls and then call back later are not suitable candidates for this system. We understand how people are reluctant to pickup on "unknown" calls, therefore, we suggest adding our telephone numbers to your list of "CONTACT(s)". This way, you should be able to readily identify our calls when you receive one. All of our numbers are available below.   Who is not eligible? This option is not available for medication management encounters, specially for controlled substances. Patients on pain medications that fall under the category of controlled substances have to come in for "Face-to-Face" encounters. This is required for mandatory monitoring of these substances. You may be asked to provide a sample for an unannounced urine drug screening test (UDS), and we will need to count your pain pills. Not bringing your pills to be counted may result in no refill. Obviously, neither one of these can be done over the phone.  When will this type of visits be used? You can request a virtual visit whenever you are physically unable to attend a regular appointment. The decision will be made by the physician (or healthcare provider) on a case by case basis.   At what time will I be called? This is an  excellent question. The providers will try to call you whenever they have time available. Do not expect to be called at any specific time. The secretaries will assign you a time for your virtual visit appointment, but this is done simply to keep a list of those patients that need to be called, but not for the purpose of keeping a time schedule. Be advised that the call may come in anytime during the day, between the hours of 8:00 AM and 8::00 PM, depending on provider availability. We do understand that the system is not perfect. If you are unable to be available that day on a moments notice, then request an "in-person" appointment rather than a "virtual visit".  Can I request my medication visits to be "Virtual"? Yes you may request it, but the decision is entirely up to the healthcare provider. Control substances require specific monitoring that requires Face-to-Face encounters. The number of encounters  and the extent of the monitoring is determined on a case by case basis.  Add a new contact to your smart phone and label it "PAIN CLINIC" Under this contact add the following numbers: Main: [phone] (Official Contact Number) Nurses: [phone] (These are outgoing only calling systems. Do not call this number.) Dr. Aliha Diedrich: [phone] or [phone] (Outgoing calls only. Do not call this number.)  ____________________________________________________________________________________________   ____________________________________________________________________________________________  Post-Procedure Discharge Instructions  Instructions: Apply ice:  Purpose: This will minimize any swelling and discomfort after procedure.  When: Day of procedure, as soon as you get home. How: Fill a plastic sandwich bag with crushed ice. Cover it with a small towel and apply to   injection site. How long: (15 min on, 15 min off) Apply for 15 minutes then remove x 15 minutes.  Repeat sequence on day  of procedure, until you go to bed. Apply heat:  Purpose: To treat any soreness and discomfort from the procedure. When: Starting the next day after the procedure. How: Apply heat to procedure site starting the day following the procedure. How long: May continue to repeat daily, until discomfort goes away. Food intake: Start with clear liquids (like water) and advance to regular food, as tolerated.  Physical activities: Keep activities to a minimum for the first 8 hours after the procedure. After that, then as tolerated. Driving: If you have received any sedation, be responsible and do not drive. You are not allowed to drive for 24 hours after having sedation. Blood thinner: (Applies only to those taking blood thinners) You may restart your blood thinner 6 hours after your procedure. Insulin: (Applies only to Diabetic patients taking insulin) As soon as you can eat, you may resume your normal dosing schedule. Infection prevention: Keep procedure site clean and dry. Shower daily and clean area with soap and water. Post-procedure Pain Diary: Extremely important that this be done correctly and accurately. Recorded information will be used to determine the next step in treatment. For the purpose of accuracy, follow these rules: Evaluate only the area treated. Do not report or include pain from an untreated area. For the purpose of this evaluation, ignore all other areas of pain, except for the treated area. After your procedure, avoid taking a long nap and attempting to complete the pain diary after you wake up. Instead, set your alarm clock to go off every hour, on the hour, for the initial 8 hours after the procedure. Document the duration of the numbing medicine, and the relief you are getting from it. Do not go to sleep and attempt to complete it later. It will not be accurate. If you received sedation, it is likely that you were given a medication that may cause amnesia. Because of this, completing  the diary at a later time may cause the information to be inaccurate. This information is needed to plan your care. Follow-up appointment: Keep your post-procedure follow-up evaluation appointment after the procedure (usually 2 weeks for most procedures, 6 weeks for radiofrequencies). DO NOT FORGET to bring you pain diary with you.   Expect: (What should I expect to see with my procedure?) From numbing medicine (AKA: Local Anesthetics): Numbness or decrease in pain. You may also experience some weakness, which if present, could last for the duration of the local anesthetic. Onset: Full effect within 15 minutes of injected. Duration: It will depend on the type of local anesthetic used. On the average, 1 to 8 hours.  From steroids (Applies only if steroids were used): Decrease in swelling or inflammation. Once inflammation is improved, relief of the pain will follow. Onset of benefits: Depends on the amount of swelling present. The more swelling, the longer it will take for the benefits to be seen. In some cases, up to 10 days. Duration: Steroids will stay in the system x 2 weeks. Duration of benefits will depend on multiple posibilities including persistent irritating factors. Side-effects: If present, they may typically last 2 weeks (the duration of the steroids). Frequent: Cramps (if they occur, drink Gatorade and take over-the-counter Magnesium 450-500 mg once to twice a day); water retention with temporary weight gain; increases in blood sugar; decreased immune system response; increased appetite. Occasional: Facial flushing (red,   warm cheeks); mood swings; menstrual changes. Uncommon: Long-term decrease or suppression of natural hormones; bone thinning. (These are more common with higher doses or more frequent use. This is why we prefer that our patients avoid having any injection therapies in other practices.)  Very Rare: Severe mood changes; psychosis; aseptic necrosis. From procedure: Some  discomfort is to be expected once the numbing medicine wears off. This should be minimal if ice and heat are applied as instructed.  Call if: (When should I call?) You experience numbness and weakness that gets worse with time, as opposed to wearing off. New onset bowel or bladder incontinence. (Applies only to procedures done in the spine)  Emergency Numbers: Durning business hours (Monday - Thursday, 8:00 AM - 4:00 PM) (Friday, 9:00 AM - 12:00 Noon): [phone] After hours: [phone] NOTE: If you are having a problem and are unable connect with, or to talk to a provider, then go to your nearest urgent care or emergency department. If the problem is serious and urgent, please call 911. ____________________________________________________________________________________________   

## 2022-04-02 ENCOUNTER — Telehealth: Payer: Self-pay | Admitting: *Deleted

## 2022-04-02 NOTE — Telephone Encounter (Signed)
Post procedure call:   no  questions or concerns.  

## 2022-04-05 ENCOUNTER — Telehealth: Payer: Self-pay | Admitting: Pain Medicine

## 2022-04-05 NOTE — Telephone Encounter (Signed)
Noted  

## 2022-04-05 NOTE — Telephone Encounter (Signed)
Patient stated that he is having pain right below of his right knee. Pt states that it's not as bad as before. When he stands the pain comes. Please give patient a call. Thanks

## 2022-04-19 NOTE — Progress Notes (Unsigned)
PROVIDER NOTE: Information contained herein reflects review and annotations entered in association with encounter. Interpretation of such information and data should be left to medically-trained personnel. Information provided to patient can be located elsewhere in the medical record under "Patient Instructions". Document created using STT-dictation technology, any transcriptional errors that may result from process are unintentional.    Patient: Brandon Bright  Service Category: E/M  Provider: Gaspar Cola, MD  DOB: Aug 14, 1946  DOS: 04/20/2022  Specialty: Interventional Pain Management  MRN: [id number]  Setting: Ambulatory outpatient  PCP: Donnamarie Rossetti, PA-C  Type: Established Patient    Referring Provider: Donnamarie Rossetti,*  Location: Office  Delivery: Face-to-face     HPI  [name], a 76 y.o. year old male, is here today because of his No primary diagnosis found.. [name] primary complain today is No chief complaint on file. Last encounter: My last encounter with him was on 04/05/2022. Pertinent problems: 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 Pain Assessment: Severity of   is reported as a  /10. Location:    / . Onset:  . Quality:  . Timing:  . Modifying factor(s):  Brandon Bright Kitchen Vitals:  vitals were not taken for this visit.   Reason for encounter: post-procedure evaluation and assessment. ***  Post-procedure evaluation   Type: Lumbar trans-foraminal epidural steroid injection (L-TFESI) #1  Laterality: Right (-RT)  Level: L4 nerve root(s) Imaging: Fluoroscopy-guided Anesthesia: Local anesthesia (1-2% Lidocaine) Anxiolysis: IV Versed 1.0 mg Sedation: None. DOS: 04/01/2022  Performed by: Gaspar Cola, MD  Purpose: Diagnostic/Therapeutic Indications: Lumbar radicular pain severe enough to impact quality of life or function. 1. Lumbosacral radiculopathy at L4 (Right)   2. 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 Lumbar radiculitis (Right) (L3 & L4)   4. Chronic lower extremity pain (1ry area of Pain) (Right)   5. DDD (degenerative disc disease), lumbar   6. Chronic midline low back pain (2ry area of Pain) w/ sciatica (Right)   7. Lumbar disc protrusion (Left: L2-3) (Central: L5-S1)   8. Lumbar spondylosis    NAS-11 Pain score:   Pre-procedure: 2 /10   Post-procedure: 0-No pain/10      Effectiveness:  Initial hour after procedure:   ***. Subsequent 4-6 hours post-procedure:   ***. Analgesia past initial 6 hours:   ***. Ongoing improvement:  Analgesic:  *** Function:    ***    ROM:    ***     Pharmacotherapy Assessment  Analgesic: No opioid analgesics prescribed by our practice.   Monitoring: [place] PMP: PDMP reviewed during this encounter.       Pharmacotherapy: No side-effects or adverse reactions reported. Compliance: No problems identified. Effectiveness: Clinically acceptable.  No notes on file  UDS:  No results found  for: "SUMMARY"   ROS  Constitutional: Denies any fever or chills Gastrointestinal: No reported hemesis, hematochezia, vomiting,  or acute GI distress Musculoskeletal: Denies any acute onset joint swelling, redness, loss of ROM, or weakness Neurological: No reported episodes of acute onset apraxia, aphasia, dysarthria, agnosia, amnesia, paralysis, loss of coordination, or loss of consciousness  Medication Review  Centrum Silver 50+Men, Hypertonic Nasal Wash, Melatonin, Multiple Vitamins-Minerals, amLODipine, aspirin, cholecalciferol, fluticasone, gabapentin, ipratropium, latanoprost, simvastatin, and valsartan-hydrochlorothiazide  History Review  Allergy: [name] is allergic to codeine, vicodin [hydrocodone-acetaminophen], celebrex [celecoxib], and ultram [tramadol]. Drug: [name]  reports no history of drug use. Alcohol:  reports no history of alcohol use. Tobacco:  reports that he has never smoked. He has never used smokeless tobacco. Social: [name]  reports that he has never smoked. He has never used smokeless tobacco. He reports that he does not drink alcohol and does not use drugs. 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 Surgical: [name]  has a past surgical history that includes ganglion cyst removal (Right); Cataract extraction w/PHACO (Left, 02/04/2016); Colonoscopy; Pilonidal cyst excision; Cardiac catheterization (1999?); Cataract extraction w/PHACO (Right, 03/03/2016); Eye surgery (May and June); and Colonoscopy with propofol (N/A, 06/05/2018). Family: family history includes Dementia in his mother; Diabetes in his brother; Heart disease in his brother, brother, and father.  Laboratory Chemistry Profile   Renal Lab Results  Component Value Date   BUN 16 08/17/2014   CREATININE 1.10  10/25/2018   GFRAA >60 08/17/2014   GFRNONAA >60 08/17/2014    Hepatic Lab Results  Component Value Date   AST 15 08/17/2014   ALT 20 08/17/2014   ALBUMIN 2.7 (L) 08/17/2014   ALKPHOS 43 (L) 08/17/2014   LIPASE 75 08/16/2014    Electrolytes Lab Results  Component Value Date   NA 144 08/17/2014   K 3.3 (L) 08/17/2014   CL 111 (H) 08/17/2014   CALCIUM 7.5 (L) 08/17/2014   MG 1.7 (L) 08/16/2014    Bone No results found for: "VD25OH", "VD125OH2TOT", "ZR0076AU6", "JF3545GY5", "25OHVITD1", "25OHVITD2", "25OHVITD3", "TESTOFREE", "TESTOSTERONE"  Inflammation (CRP: Acute Phase) (ESR: Chronic Phase) Lab Results  Component Value Date   CRP 2 08/28/2020   ESRSEDRATE 26 08/28/2020         Note: Above Lab results reviewed.  Recent Imaging Review  DG PAIN CLINIC C-ARM 1-60 MIN NO REPORT Fluoro was used, but no Radiologist interpretation will be provided.  Please refer to "NOTES" tab for provider progress note. Note: Reviewed        Physical Exam  General appearance: Well nourished, well developed, and well hydrated. In no apparent acute distress Mental status: Alert, oriented x 3 (person, place, & time)       Respiratory: No evidence of acute respiratory distress Eyes: PERLA Vitals: There were no vitals taken for this visit. BMI: Estimated body mass index is 31.57 kg/m as calculated from the following:   Height as of 04/01/22: '5\' 10"'  (1.778 m).   Weight as of 04/01/22: 220 lb (99.8 kg). Ideal: Patient weight not recorded  Assessment   Diagnosis Status  No diagnosis found. Controlled Controlled Controlled   Updated Problems: No problems updated.  Plan of Care  Problem-specific:  No problem-specific Assessment & Plan notes found for this encounter.  Mr. CYLER KAPPES has a  current medication list which includes the following long-term medication(s): amlodipine, fluticasone, gabapentin, hypertonic nasal wash, ipratropium, simvastatin, and  valsartan-hydrochlorothiazide.  Pharmacotherapy (Medications Ordered): No orders of the defined types were placed in this encounter.  Orders:  No orders of the defined types were placed in this encounter.  Follow-up plan:   No follow-ups on file.     Interventional Therapies  Risk  Complexity Considerations:   Estimated body mass index is 31.57 kg/m as calculated from the following:   Height as of this encounter: '5\' 10"'  (1.778 m).   Weight as of this encounter: 220 lb (99.8 kg). WNL   Planned  Pending:   Diagnostic/therapeutic right L4 TFESI #1    Under consideration:   Diagnostic right L3 TFESI  Diagnostic right L4 TFESI  Diagnostic bilateral lumbar facet MBB  Diagnostic Trigger point injection  Diagnostic  bilateral cervical facet MBB     Completed:   Diagnostic/therapeutic left IA hip joint injection x1 (09/09/2020) (100/100/100/100)    Therapeutic  Palliative (PRN) options:  Palliative/therapeutic left IA hip joint injection #2  Palliative right L4-5 LESI (none done by me) The patient indicated having had LESI x 4, by Dr. Primus Bravo. (before 2000) (Procedure described as helpful).        Recent Visits Date Type Provider Dept  04/01/22 Procedure visit Milinda Pointer, MD [facility]  03/10/22 Office Visit Milinda Pointer, MD [facility]  Showing recent visits within past 90 days and meeting all other requirements Future Appointments Date Type Provider Dept  04/20/22 Appointment Milinda Pointer, [facility]  06/23/22 Appointment Milinda Pointer, MD [facility]  Showing future appointments within next 90 days and meeting all other requirements  I discussed the assessment and treatment plan with the patient. The patient was provided an opportunity to ask questions and all were answered. The patient agreed with the plan and demonstrated an understanding of the instructions.  Patient advised to call back or seek an  in-person evaluation if the symptoms or condition worsens.  Duration of encounter: *** minutes.  Total time on encounter, as per AMA guidelines included both the face-to-face and non-face-to-face time personally spent by the physician and/or other qualified health care professional(s) on the day of the encounter (includes time in activities that require the physician or other qualified health care professional and does not include time in activities normally performed by clinical staff). Physician's time may include the following activities when performed: preparing to see the patient (eg, review of tests, pre-charting review of records) obtaining and/or reviewing separately obtained history performing a medically appropriate examination and/or evaluation counseling and educating the patient/family/caregiver ordering medications, tests, or procedures referring and communicating with other health care professionals (when not separately reported) documenting clinical information in the electronic or other health record independently interpreting results (not separately reported) and communicating results to the patient/ family/caregiver care coordination (not separately reported)  Note by: Gaspar Cola, MD Date: 04/20/2022; Time: 7:27 AM

## 2022-04-20 ENCOUNTER — Encounter: Payer: Self-pay | Admitting: Pain Medicine

## 2022-04-20 ENCOUNTER — Ambulatory Visit: Payer: Medicare PPO | Attending: Pain Medicine | Admitting: Pain Medicine

## 2022-04-20 VITALS — BP 125/61 | HR 94 | Temp 97.8°F | Resp 16 | Ht 70.0 in | Wt 221.0 lb

## 2022-04-20 DIAGNOSIS — M79604 Pain in right leg: Secondary | ICD-10-CM | POA: Diagnosis present

## 2022-04-20 DIAGNOSIS — M5416 Radiculopathy, lumbar region: Secondary | ICD-10-CM | POA: Insufficient documentation

## 2022-04-20 DIAGNOSIS — M5417 Radiculopathy, lumbosacral region: Secondary | ICD-10-CM | POA: Diagnosis present

## 2022-04-20 DIAGNOSIS — G8929 Other chronic pain: Secondary | ICD-10-CM | POA: Insufficient documentation

## 2022-04-20 NOTE — Patient Instructions (Signed)
______________________________________________________________________  Preparing for your procedure (without sedation)  Procedure appointments are limited to planned procedures: No Prescription Refills. No disability issues will be discussed. No medication changes will be discussed.  Instructions: Food Intake: Avoid eating anything for at least 4 hours prior to your procedure. Transportation: Unless otherwise stated by your physician, bring a driver. Morning Medicines: Take all of your scheduled morning medications. If you take heart medicine, except for blood thinners, do not forget to take it the morning of the procedure. If your Diastolic (lower reading) is above 100 mmHg, elective cases will be cancelled/rescheduled. Blood thinners: These will need to be stopped for procedures. Notify our staff if you are taking any blood thinners. Depending on which one you take, there will be specific instructions on how and when to stop it. Diabetics on insulin: Notify the staff so that you can be scheduled 1st case in the morning. If your diabetes requires high dose insulin, take only  of your normal insulin dose the morning of the procedure and notify the staff that you have done so. Preventing infections: Shower with an antibacterial soap the morning of your procedure.  Build-up your immune system: Take 1000 mg of Vitamin C with every meal (3 times a day) the day prior to your procedure. Antibiotics: Inform the staff if you have a condition or reason that requires you to take antibiotics before dental procedures. Pregnancy: If you are pregnant, call and cancel the procedure. Sickness: If you have a cold, fever, or any active infections, call and cancel the procedure. Arrival: You must be in the facility at least 30 minutes prior to your scheduled procedure. Children: Do not bring any children with you. Dress appropriately: There is always a possibility that your clothing may get soiled. Valuables:  Do not bring any jewelry or valuables.  Reasons to call and reschedule or cancel your procedure: (Following these recommendations will minimize the risk of a serious complication.) Surgeries: Avoid having procedures within 2 weeks of any surgery. (Avoid for 2 weeks before or after any surgery). Flu Shots: Avoid having procedures within 2 weeks of a flu shots or . (Avoid for 2 weeks before or after immunizations). Barium: Avoid having a procedure within 7-10 days after having had a radiological study involving the use of radiological contrast. (Myelograms, Barium swallow or enema study). Heart attacks: Avoid any elective procedures or surgeries for the initial 6 months after a "Myocardial Infarction" (Heart Attack). Blood thinners: It is imperative that you stop these medications before procedures. Let us know if you if you take any blood thinner.  Infection: Avoid procedures during or within two weeks of an infection (including chest colds or gastrointestinal problems). Symptoms associated with infections include: Localized redness, fever, chills, night sweats or profuse sweating, burning sensation when voiding, cough, congestion, stuffiness, runny nose, sore throat, diarrhea, nausea, vomiting, cold or Flu symptoms, recent or current infections. It is specially important if the infection is over the area that we intend to treat. Heart and lung problems: Symptoms that may suggest an active cardiopulmonary problem include: cough, chest pain, breathing difficulties or shortness of breath, dizziness, ankle swelling, uncontrolled high or unusually low blood pressure, and/or palpitations. If you are experiencing any of these symptoms, cancel your procedure and contact your primary care physician for an evaluation.  Remember:  Regular Business hours are:  Monday to Thursday 8:00 AM to 4:00 PM  Provider's Schedule: Francisco Naveira, MD:  Procedure days: Tuesday and Thursday 7:30 AM to 4:00 PM    Bilal  Lateef, MD:  Procedure days: Monday and Wednesday 7:30 AM to 4:00 PM ______________________________________________________________________   

## 2022-06-22 ENCOUNTER — Encounter: Payer: Self-pay | Admitting: Pain Medicine

## 2022-06-23 ENCOUNTER — Ambulatory Visit: Payer: Medicare PPO | Attending: Pain Medicine | Admitting: Pain Medicine

## 2022-06-23 DIAGNOSIS — M79604 Pain in right leg: Secondary | ICD-10-CM | POA: Diagnosis not present

## 2022-06-23 DIAGNOSIS — M5441 Lumbago with sciatica, right side: Secondary | ICD-10-CM | POA: Diagnosis not present

## 2022-06-23 DIAGNOSIS — R937 Abnormal findings on diagnostic imaging of other parts of musculoskeletal system: Secondary | ICD-10-CM

## 2022-06-23 DIAGNOSIS — G894 Chronic pain syndrome: Secondary | ICD-10-CM | POA: Diagnosis not present

## 2022-06-23 DIAGNOSIS — M542 Cervicalgia: Secondary | ICD-10-CM

## 2022-06-23 DIAGNOSIS — G8929 Other chronic pain: Secondary | ICD-10-CM

## 2022-06-23 NOTE — Progress Notes (Signed)
Patient: Brandon Bright  Service Category: E/M  Provider: Gaspar Cola, MD  DOB: [birth year]  DOS: 06/23/2022  Location: Office  MRN: [id number]  Setting: Ambulatory outpatient  Referring Provider: Donnamarie Bright,*  Type: Established Patient  Specialty: Interventional Pain Management  PCP: Brandon Rossetti, PA-C  Location: Remote location  Delivery: TeleHealth     Virtual Encounter - Pain Management PROVIDER NOTE: Information contained herein reflects review and annotations entered in association with encounter. Interpretation of such information and data should be left to medically-trained personnel. Information provided to patient can be located elsewhere in the medical record under "Patient Instructions". Document created using STT-dictation technology, any transcriptional errors that may result from process are unintentional.    Contact & Pharmacy Preferred: [phone] Home: [phone] (home) Mobile: [phone] (mobile) E-mail: [email or web address] .[place], [place] - [address] [address] Phone: [phone] Fax: [phone]  [id number] [email or web address]: [phone]: [phone]  Pre-screening  Brandon Bright offered "in-person" vs "virtual" encounter. He indicated preferring virtual for this encounter.   Reason COVID-19*  Social distancing based on CDC and AMA recommendations.   I contacted Brandon Bright 06/23/2022 via telephone.      I clearly identified myself as FGaspar Cola MD. I verified that I was speaking with the correct person using two identifiers (Name: Brandon Bright and date of birth: 603-19-1947.  Consent I sought verbal advanced consent from Brandon Bright. I informed Brandon Bright of possible security and privacy concerns, risks, and limitations associated with  providing "not-in-person" medical evaluation and management services. I also informed Brandon Bright of the availability of "in-person" appointments. Finally, I informed him that there would be a charge for the virtual visit and that he could be  personally, fully or partially, financially responsible for it. Brandon Bright understanding and agreed to proceed.   Historic Elements   Mr. JJERMICHAEL BELMARESis a 76y.o. year old, male patient evaluated today after our last contact on 04/20/2022. [name] has a past medical history of AGE (acute gastroenteritis) (08/19/2014), Anemia, [place]) [place] (08[place]), Arthritis, Chronic kidney disease, Colon polyps, Diverticulitis, Gallstones, Hypercholesterolemia, Hypertension, Hypokalemia (01/02/2016), [place]ipheral vascular disease ([place], and [place]osing [place]itis [place] (12/15/2015). He also  has a past surgical history that includes ganglion cyst removal (Right); Cataract extraction w/PHACO (Left, 02/04/2016); Colonoscopy; Pilonidal cyst excision; Cardiac catheterization (1999?); Cataract extraction w/PHACO (Right, 03/03/2016); Eye surgery (May and June); and Colonoscopy with propofol (N/A, 06/05/2018). Brandon Bright a current medication list which includes the following prescription(s): amlodipine, aspirin, cholecalciferol, fluticasone, gabapentin, hypertonic nasal wash, ipratropium, latanoprost, melatonin, centrum silver 50+men, multiple vitamins-minerals, simvastatin, and valsartan-hydrochlorothiazide. He  reports that he has never smoked. He has never used smokeless tobacco. He reports that he does not drink alcohol and does not use drugs. Brandon Bright allergic to codeine, vicodin [hydrocodone-acetaminophen], celebrex [celecoxib], and ultram [tramadol].   HPI  Today, he is being contacted for  follow-up.  He refers that the injections that we did for him 2 years ago are still providing him with good relief of the pain and he currently has no back  pain or leg pain.  They hips bother him a little bit when he first wakes up in the morning but once he gets going the pain goes away.  This encounter was a follow-up appointment so that he can stay within the system.   Pharmacotherapy Assessment   Opioid Analgesic: No opioid analgesics prescribed by our practice.   Monitoring: [place] PMP: PDMP not reviewed this encounter.       Pharmacotherapy: No side-effects or adverse reactions reported. Compliance: No problems identified. Effectiveness: Clinically acceptable. Plan: Refer to "POC". UDS: No results found for: "SUMMARY" No results found for: "CBDTHCR", "D8THCCBX", "D9THCCBX"   Laboratory Chemistry Profile   Renal Lab Results  Component Value Date   BUN 16 08/17/2014   CREATININE 1.10 10/25/2018   GFRAA >60 08/17/2014   GFRNONAA >60 08/17/2014    Hepatic Lab Results  Component Value Date   AST 15 08/17/2014   ALT 20 08/17/2014   ALBUMIN 2.7 (L) 08/17/2014   ALKPHOS 43 (L) 08/17/2014   LIPASE 75 08/16/2014    Electrolytes Lab Results  Component Value Date   NA 144 08/17/2014   K 3.3 (L) 08/17/2014   CL 111 (H) 08/17/2014   CALCIUM 7.5 (L) 08/17/2014   MG 1.7 (L) 08/16/2014    Bone No results found for: "VD25OH", "VD125OH2TOT", "CB7628BT5", "[id number]", "25OHVITD1", "25OHVITD2", "25OHVITD3", "TESTOFREE", "TESTOSTERONE"  Inflammation (CRP: Acute Phase) (ESR: Chronic Phase) Lab Results  Component Value Date   CRP 2 08/28/2020   ESRSEDRATE 26 08/28/2020         Note: Above Lab results reviewed.  Imaging  DG PAIN CLINIC C-ARM 1-60 MIN NO REPORT Fluoro was used, but no Radiologist interpretation will be provided.  Please refer to "NOTES" tab for provider progress note.  Assessment  The primary encounter diagnosis was Chronic pain syndrome. Diagnoses of Chronic lower extremity pain (1ry area of Pain) (Right), Chronic midline low back pain (2ry area of Pain) w/ sciatica (Right), Chronic neck pain (3ry area of Pain)  (Left), and Abnormal MRI, lumbar spine (03/22/2022) were also pertinent to this visit.  Plan of Care  Problem-specific:  No problem-specific Assessment & Plan notes found for this encounter.  Brandon Bright has a current medication list which includes the following long-term medication(s): amlodipine, fluticasone, gabapentin, hypertonic nasal wash, ipratropium, simvastatin, and valsartan-hydrochlorothiazide.  Pharmacotherapy (Medications Ordered): No orders of the defined types were placed in this encounter.  Orders:  No orders of the defined types were placed in this encounter.  Follow-up plan:   Return if symptoms worsen or fail to improve.     Interventional Therapies  Risk  Complexity Considerations:   Estimated body mass index is 31.57 kg/m as calculated from the following:   Height as of this encounter: _0  (1.778 m).   Weight as of this encounter: 220 lb (99.8 kg). WNL   Planned  Pending:      Under consideration:   Diagnostic right L3 TFESI  Diagnostic bilateral lumbar facet MBB  Diagnostic Trigger point injection  Diagnostic  bilateral cervical facet MBB     Completed:   Diagnostic/therapeutic right L4 TFESI x1 (04/01/2022) (100/100/100/100)  Diagnostic/therapeutic left IA hip joint injection x1 (09/09/2020) (100/100/100/100)    Therapeutic  Palliative (PRN) options:  Palliative/therapeutic left IA hip joint injection #2  Palliative right L4-5 LESI (none done by me) The patient indicated having had LESI x 4, by Dr. Primus Bravo. (before 2000) (Procedure described as helpful).         Recent Visits Date Type Provider Dept  04/20/22 Office Visit Milinda Pointer, MD [facility]  04/01/22 Procedure visit Milinda Pointer, MD [facility]  Showing recent visits within  past 90 days and meeting all other requirements Today's Visits Date Type Provider Dept  06/23/22 Office Visit Milinda Pointer, MD [facility]  Showing  today's visits and meeting all other requirements Future Appointments No visits were found meeting these conditions. Showing future appointments within next 90 days and meeting all other requirements  I discussed the assessment and treatment plan with the patient. The patient was provided an opportunity to ask questions and all were answered. The patient agreed with the plan and demonstrated an understanding of the instructions.  Patient advised to call back or seek an in-person evaluation if the symptoms or condition worsens.  Duration of encounter: 7 minutes.  Note by: Gaspar Cola, MD Date: 06/23/2022; Time: 2:26 PM

## 2022-07-31 IMAGING — CR DG HIP (WITH OR WITHOUT PELVIS) 2-3V*L*
1 series · 3 of 3 positions shown · non-contrast
Comparison: None.

CLINICAL DATA: Left hip pain, arthralgia

EXAM:
DG HIP (WITH OR WITHOUT PELVIS) 2-3V LEFT

[Series 1: dg hip unilat w or w/o pelvis 2-3 views  · non-contrast · 0.14mm/px · 3 of 3 slices shown]
[im 1/3]
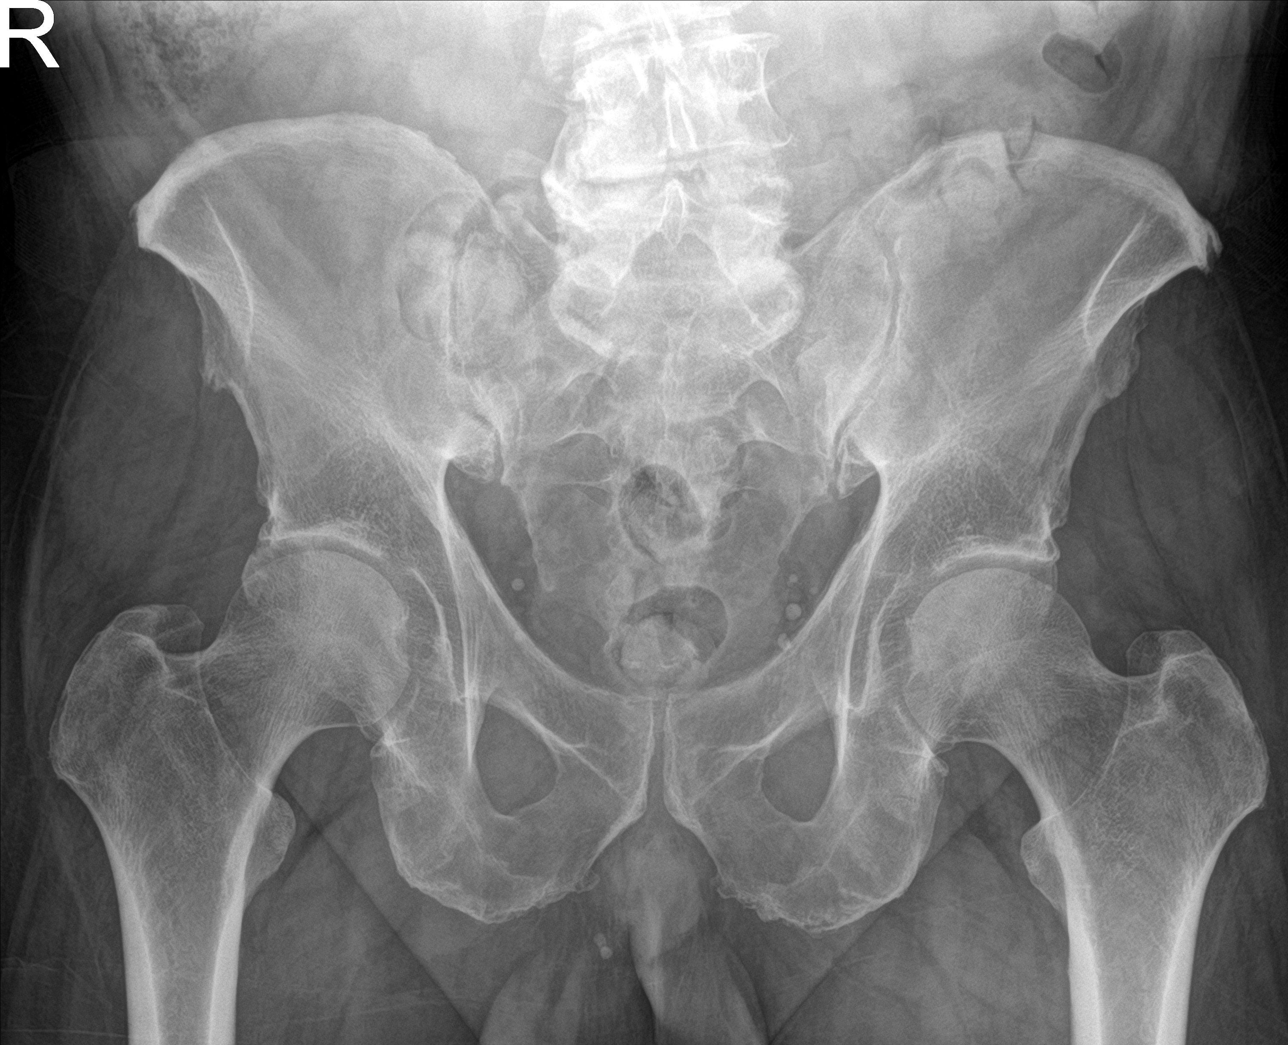
[im 2/3]
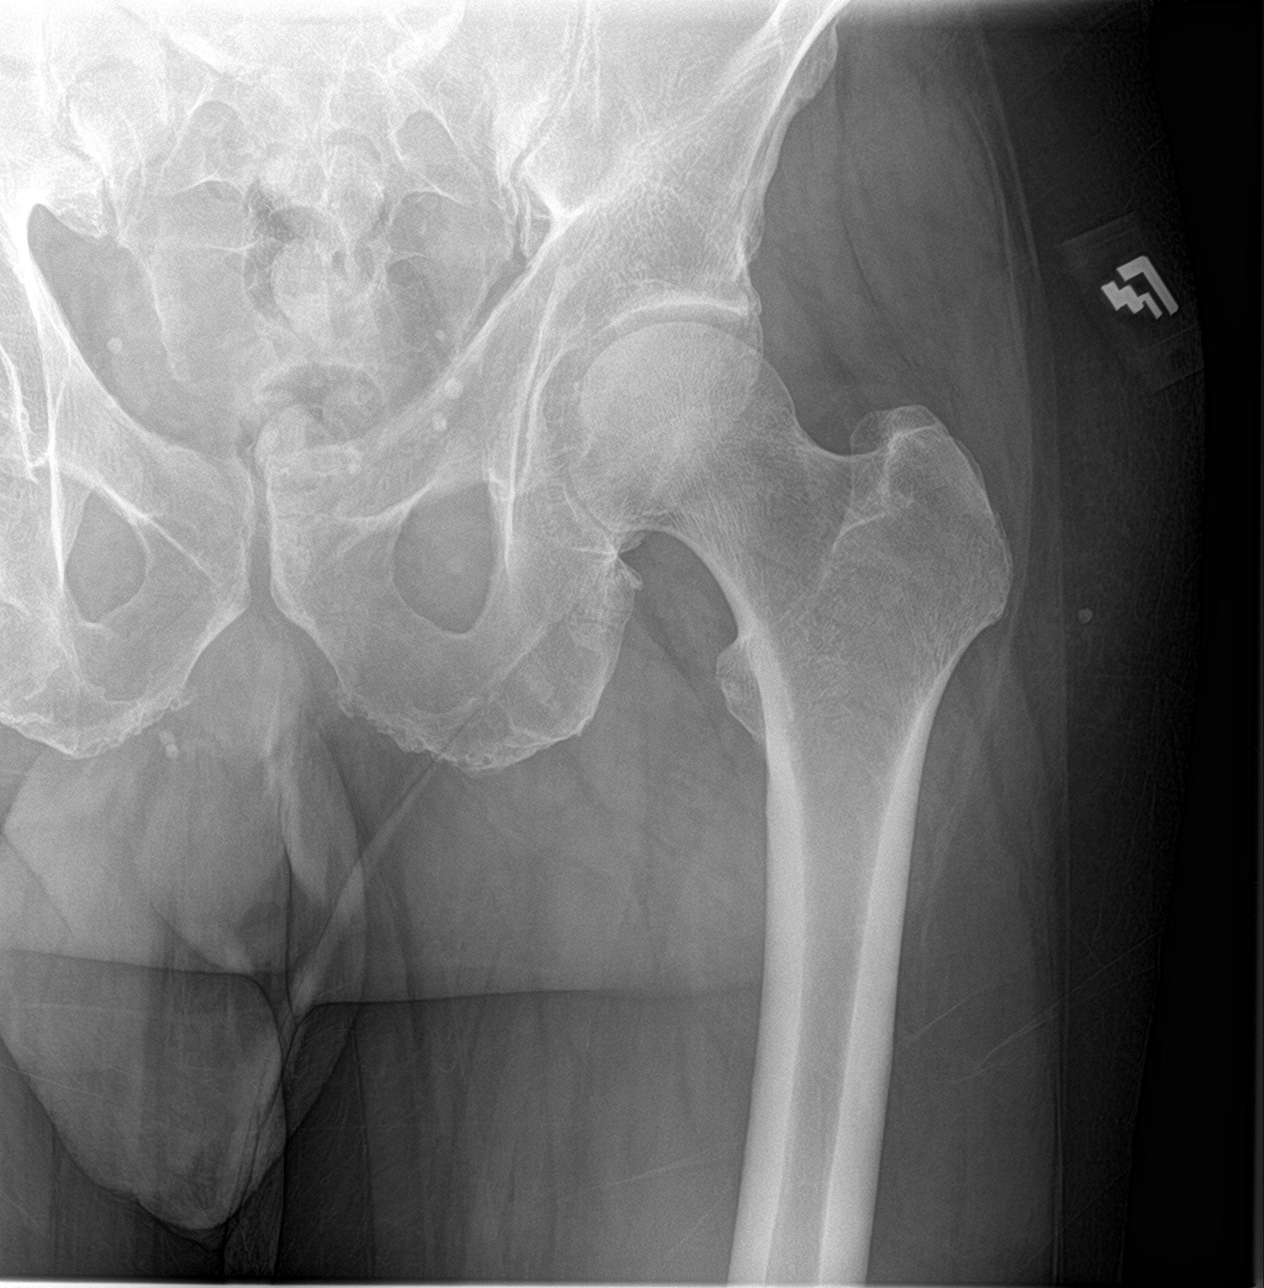
[im 3/3]
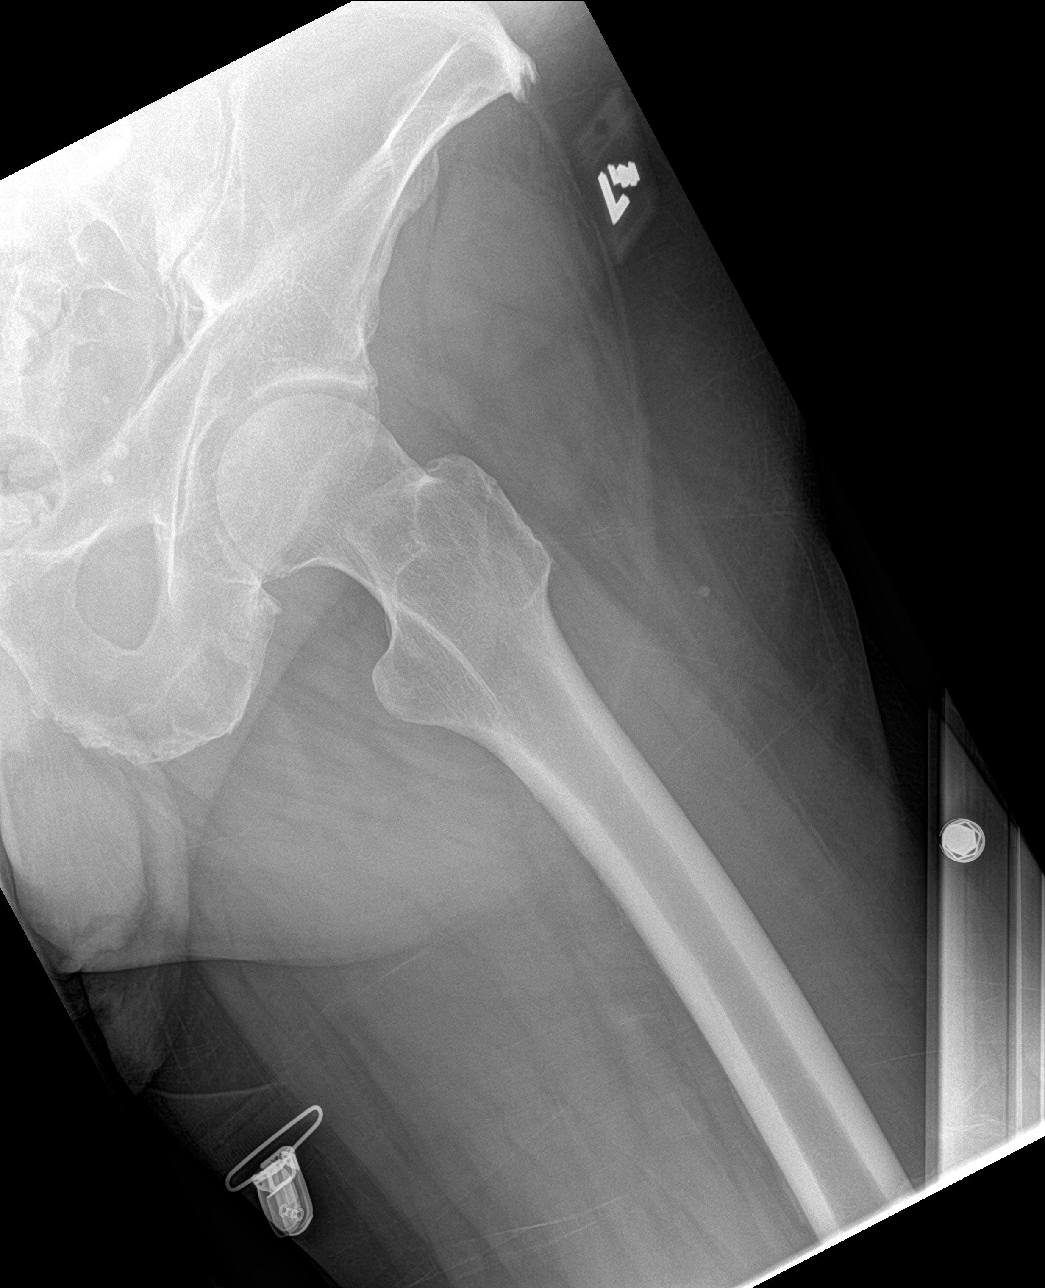

[3 of 3 positions shown; findings below may reference images not displayed]

FINDINGS: There is no evidence of hip fracture or dislocation. There is no
evidence of arthropathy or other focal bone abnormality.
IMPRESSION: Negative.

## 2022-10-18 DIAGNOSIS — I6523 Occlusion and stenosis of bilateral carotid arteries: Secondary | ICD-10-CM | POA: Insufficient documentation

## 2022-12-27 ENCOUNTER — Other Ambulatory Visit: Payer: Self-pay | Admitting: Otolaryngology

## 2022-12-27 DIAGNOSIS — J3489 Other specified disorders of nose and nasal sinuses: Secondary | ICD-10-CM

## 2023-01-04 ENCOUNTER — Ambulatory Visit
Admission: RE | Admit: 2023-01-04 | Discharge: 2023-01-04 | Disposition: A | Discharge status: Home / Self Care | Payer: Medicare PPO | Source: Ambulatory Visit | Attending: Otolaryngology | Admitting: Otolaryngology

## 2023-01-04 DIAGNOSIS — J3489 Other specified disorders of nose and nasal sinuses: Secondary | ICD-10-CM

## 2023-03-18 ENCOUNTER — Encounter: Payer: Self-pay | Admitting: Gastroenterology

## 2023-03-21 ENCOUNTER — Encounter: Payer: Self-pay | Admitting: Gastroenterology

## 2023-03-21 ENCOUNTER — Encounter
Admission: RE | Disposition: A | Discharge status: Home / Self Care | Payer: Self-pay | Source: Home / Self Care | Attending: Gastroenterology

## 2023-03-21 ENCOUNTER — Ambulatory Visit: Payer: Medicare PPO | Admitting: Anesthesiology

## 2023-03-21 ENCOUNTER — Ambulatory Visit
Admission: RE | Admit: 2023-03-21 | Discharge: 2023-03-21 | Disposition: A | Discharge status: Home / Self Care | Payer: Medicare PPO | Attending: Gastroenterology | Admitting: Gastroenterology

## 2023-03-21 DIAGNOSIS — K52832 Lymphocytic colitis: Secondary | ICD-10-CM | POA: Diagnosis not present

## 2023-03-21 DIAGNOSIS — K921 Melena: Secondary | ICD-10-CM | POA: Insufficient documentation

## 2023-03-21 DIAGNOSIS — K640 First degree hemorrhoids: Secondary | ICD-10-CM | POA: Insufficient documentation

## 2023-03-21 DIAGNOSIS — N189 Chronic kidney disease, unspecified: Secondary | ICD-10-CM | POA: Insufficient documentation

## 2023-03-21 DIAGNOSIS — D122 Benign neoplasm of ascending colon: Secondary | ICD-10-CM | POA: Diagnosis not present

## 2023-03-21 DIAGNOSIS — I129 Hypertensive chronic kidney disease with stage 1 through stage 4 chronic kidney disease, or unspecified chronic kidney disease: Secondary | ICD-10-CM | POA: Insufficient documentation

## 2023-03-21 DIAGNOSIS — D124 Benign neoplasm of descending colon: Secondary | ICD-10-CM | POA: Insufficient documentation

## 2023-03-21 DIAGNOSIS — K625 Hemorrhage of anus and rectum: Secondary | ICD-10-CM | POA: Diagnosis present

## 2023-03-21 HISTORY — PX: BIOPSY: SHX5522

## 2023-03-21 HISTORY — PX: COLONOSCOPY WITH PROPOFOL: SHX5780

## 2023-03-21 HISTORY — PX: POLYPECTOMY: SHX5525

## 2023-03-21 SURGERY — COLONOSCOPY WITH PROPOFOL
Anesthesia: General

## 2023-03-21 MED ORDER — SODIUM CHLORIDE 0.9 % IV SOLN: INTRAVENOUS | Status: DC

## 2023-03-21 MED ORDER — PROPOFOL 10 MG/ML IV BOLUS
INTRAVENOUS | Status: DC | PRN
  Administered 2023-03-21: 60 mg via INTRAVENOUS

## 2023-03-21 MED ORDER — LIDOCAINE HCL (CARDIAC) PF 100 MG/5ML IV SOSY
PREFILLED_SYRINGE | INTRAVENOUS | Status: DC | PRN
  Administered 2023-03-21: 100 mg via INTRAVENOUS

## 2023-03-21 MED ORDER — PROPOFOL 500 MG/50ML IV EMUL
INTRAVENOUS | Status: DC | PRN
  Administered 2023-03-21: 155 ug/kg/min via INTRAVENOUS

## 2023-03-21 NOTE — Anesthesia Preprocedure Evaluation (Signed)
Anesthesia Evaluation  Patient identified by MRN, date of birth, ID band Patient awake    Reviewed: Allergy & Precautions, H&P , NPO status , Patient's Chart, lab work & pertinent test results, reviewed documented beta blocker date and time   History of Anesthesia Complications Negative for: history of anesthetic complications  Airway Mallampati: II  TM Distance: >3 FB Neck ROM: full    Dental  (+) Caps, Chipped   Pulmonary neg pulmonary ROS, neg sleep apnea, neg COPD, Patient abstained from smoking.Not current smoker   Pulmonary exam normal        Cardiovascular Exercise Tolerance: Good METShypertension, Pt. 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)  Past Surgical History: 1999?: CARDIAC CATHETERIZATION     Comment:  no issue/ DR Gwen Pounds 02/04/2016: CATARACT EXTRACTION W/PHACO; Left     Comment:  Procedure: CATARACT EXTRACTION PHACO AND INTRAOCULAR               LENS PLACEMENT (IOC) LEFT EYE;  Surgeon:  Lockie Mola, MD;  Location: [facility];  Service:              Ophthalmology;  Laterality: Left; 03/03/2016: CATARACT EXTRACTION W/PHACO; Right     Comment:  Procedure: CATARACT EXTRACTION PHACO AND INTRAOCULAR               LENS PLACEMENT (IOC);  Surgeon: Lockie Mola, MD;               Location: [facility];  Service: Ophthalmology;                Laterality: Right; No date: COLONOSCOPY May and June: EYE SURGERY     Comment:  bilateral cataract No date: ganglion cyst removal; Right No date: PILONIDAL CYST EXCISION  BMI    Body Mass Index:  31.57 kg/m      Reproductive/Obstetrics                             Anesthesia Physical Anesthesia Plan  ASA: 2  Anesthesia Plan: General   Post-op Pain Management: Minimal or no pain anticipated   Induction: Intravenous  PONV Risk Score and Plan: 2 and Treatment may vary due to age or medical condition and TIVA  Airway Management Planned: Nasal Cannula and Natural Airway  Additional Equipment: None  Intra-op Plan:   Post-operative Plan:  Informed Consent: I have reviewed the patients History and Physical, chart, labs and discussed the procedure including the risks, benefits and alternatives for the proposed anesthesia with the patient or authorized representative who has indicated his/her understanding and acceptance.     Dental Advisory Given  Plan Discussed with: CRNA  Anesthesia Plan Comments: (Discussed risks of anesthesia with patient, including possibility of difficulty with spontaneous ventilation under anesthesia necessitating airway intervention, PONV, and rare risks such as cardiac or respiratory or neurological events, and allergic reactions. Discussed the role of CRNA in patient's perioperative care. Patient understands.)        Anesthesia Quick Evaluation

## 2023-03-21 NOTE — Op Note (Signed)
[facility] Gastroenterology Patient Name: Brandon Bright Procedure Date: 03/21/2023 9:51 AM MRN: [id number] Account #: [id number] Date of Birth: Apr 23, 1946 Admit Type: Outpatient Age: 77 Room: [facility] ENDO ROOM 2 Gender: Male Note Status: Supervisor Override Instrument Name: Prentice Docker [id number] Procedure:             Colonoscopy Indications:           Rectal bleeding Providers:             Jaynie Collins DO, DO Referring MD:          Wilford Corner (Referring MD) Medicines:             Monitored Anesthesia Care Complications:         No immediate complications. Estimated blood loss:                         Minimal. Procedure:             Pre-Anesthesia Assessment:                        - Prior to the procedure, a History and Physical was                         performed, and patient medications and allergies were                         reviewed. The patient is competent. The risks and                         benefits of the procedure and the sedation options and                         risks were discussed with the patient. All questions                         were answered and informed consent was obtained.                         Patient identification and proposed procedure were                         verified by the physician, the nurse, the anesthetist                         and the technician in the endoscopy suite. Mental                         Status Examination: alert and oriented. Airway                         Examination: normal oropharyngeal airway and neck                         mobility. Respiratory Examination: clear to                         auscultation. CV Examination: RRR, no murmurs, no S3  or S4. Prophylactic Antibiotics: The patient does not                         require prophylactic antibiotics. Prior                         Anticoagulants: The patient has taken no anticoagulant                          or antiplatelet agents. ASA Grade Assessment: II - A                         patient with mild systemic disease. After reviewing                         the risks and benefits, the patient was deemed in                         satisfactory condition to undergo the procedure. The                         anesthesia plan was to use monitored anesthesia care                         (MAC). Immediately prior to administration of                         medications, the patient was re-assessed for adequacy                         to receive sedatives. The heart rate, respiratory                         rate, oxygen saturations, blood pressure, adequacy of                         pulmonary ventilation, and response to care were                         monitored throughout the procedure. The physical                         status of the patient was re-assessed after the                         procedure.                        After obtaining informed consent, the colonoscope was                         passed under direct vision. Throughout the procedure,                         the patient's blood pressure, pulse, and oxygen                         saturations were monitored continuously. The  Colonoscope was introduced through the anus and                         advanced to the the terminal ileum, with                         identification of the appendiceal orifice and IC                         valve. The colonoscopy was performed without                         difficulty. The patient tolerated the procedure well.                         The quality of the bowel preparation was evaluated                         using the BBPS [facility] Bowel Preparation Scale) with                         scores of: Right Colon = 2 (minor amount of residual                         staining, small fragments of stool and/or opaque                         liquid, but mucosa seen  well), Transverse Colon = 3                         (entire mucosa seen well with no residual staining,                         small fragments of stool or opaque liquid) and Left                         Colon = 2 (minor amount of residual staining, small                         fragments of stool and/or opaque liquid, but mucosa                         seen well). The total BBPS score equals 7. The quality                         of the bowel preparation was good. The terminal ileum,                         ileocecal valve, appendiceal orifice, and rectum were                         photographed. Biopsy forceps would not work after                         initial opening. Due to this and not being able to  withdraw instrument through the scope channel, the                         entire scope was carefully withdrawn out of the                         patient and the forceps were then removed from the                         scope channel. The scope was then reintroduced and                         advanced in the same manner. Findings:      The perianal and digital rectal examinations were normal. Pertinent       negatives include normal sphincter tone.      The terminal ileum appeared normal. Estimated blood loss: none.      Non-bleeding internal hemorrhoids were found during retroflexion. The       hemorrhoids were Grade I (internal hemorrhoids that do not prolapse).       Estimated blood loss: none.      A 2 to 3 mm polyp was found in the descending colon. The polyp was       sessile. The polyp was removed with a jumbo cold forceps. Resection and       retrieval were complete. Estimated blood loss was minimal.      A 1 to 2 mm polyp was found in the ascending colon. The polyp was       sessile. The polyp was removed with a jumbo cold forceps. Resection and       retrieval were complete. Estimated blood loss was minimal.      Normal mucosa was found in the  entire colon. Biopsies for histology were       taken with a cold forceps from the right colon and left colon for       evaluation of microscopic colitis. Estimated blood loss was minimal.      The exam was otherwise without abnormality on direct and retroflexion       views. Impression:            - The examined portion of the ileum was normal.                        - Non-bleeding internal hemorrhoids.                        - One 2 to 3 mm polyp in the descending colon, removed                         with a jumbo cold forceps. Resected and retrieved.                        - One 1 to 2 mm polyp in the ascending colon, removed                         with a jumbo cold forceps. Resected and retrieved.                        - Normal mucosa in the  entire examined colon. Biopsied.                        - The examination was otherwise normal on direct and                         retroflexion views. Recommendation:        - Patient has a contact number available for                         emergencies. The signs and symptoms of potential                         delayed complications were discussed with the patient.                         Return to normal activities tomorrow. Written                         discharge instructions were provided to the patient.                        - Resume previous diet.                        - Continue present medications.                        - Await pathology results.                        - Repeat colonoscopy is not recommended due to current                         age (13 years or older) not indicated due to advanced                         age.                        - Return to GI office as previously scheduled.                        - The findings and recommendations were discussed with                         the patient. Procedure Code(s):     --- Professional ---                        [phone], Colonoscopy, flexible; with biopsy, single or                          multiple Diagnosis Code(s):     --- Professional ---                        K64.0, First degree hemorrhoids                        D12.4, Benign neoplasm of descending colon  D12.2, Benign neoplasm of ascending colon                        K92.1, Melena (includes Hematochezia) CPT copyright 2022 American Medical Association. All rights reserved. The codes documented in this report are preliminary and upon coder review may  be revised to meet current compliance requirements. Attending Participation:      I personally performed the entire procedure. Elfredia Nevins, DO Jaynie Collins DO, DO 03/21/2023 10:34:25 AM This report has been signed electronically. Number of Addenda: 0 Note Initiated On: 03/21/2023 9:51 AM Scope Withdrawal Time: 0 hours 20 minutes 48 seconds  Total Procedure Duration: 0 hours 28 minutes 10 seconds  Estimated Blood Loss:  Estimated blood loss was minimal.      [facility]

## 2023-03-21 NOTE — Anesthesia Procedure Notes (Signed)
Procedure Name: General with mask airway Date/Time: 03/21/2023 9:56 AM  Performed by: Mohammed Kindle, CRNAPre-anesthesia Checklist: Patient identified, Emergency Drugs available, Suction available and Patient being monitored Oxygen Delivery Method: Simple face mask Induction Type: IV induction Placement Confirmation: positive ETCO2, CO2 detector and breath sounds checked- equal and bilateral Dental Injury: Teeth and Oropharynx as per pre-operative assessment

## 2023-03-21 NOTE — Interval H&P Note (Signed)
History and Physical Interval Note: Preprocedure H&P from 03/21/23  was reviewed and there was no interval change after seeing and examining the patient.  Written consent was obtained from the patient after discussion of risks, benefits, and alternatives. Patient has consented to proceed with Colonoscopy with possible intervention   03/21/2023 9:49 AM  Brandon Bright  has presented today for surgery, with the diagnosis of 569.3 (ICD-9-CM) - K62.5 (ICD-10-CM) - Rectal bleeding.  The various methods of treatment have been discussed with the patient and family. After consideration of risks, benefits and other options for treatment, the patient has consented to  Procedure(s): COLONOSCOPY WITH PROPOFOL (N/A) as a surgical intervention.  The patient's history has been reviewed, patient examined, no change in status, stable for surgery.  I have reviewed the patient's chart and labs.  Questions were answered to the patient's satisfaction.     Jaynie Collins

## 2023-03-21 NOTE — Transfer of Care (Signed)
Immediate Anesthesia Transfer of Care Note  Patient: Brandon Bright  Procedure(s) Performed: COLONOSCOPY WITH PROPOFOL  Patient Location: Endoscopy Unit  Anesthesia Type:General  Level of Consciousness: drowsy and patient cooperative  Airway & Oxygen Therapy: Patient Spontanous Breathing and Patient connected to face mask oxygen  Post-op Assessment: Report given to RN and Post -op Vital signs reviewed and stable  Post vital signs: Reviewed and stable  Last Vitals:  Vitals Value Taken Time  BP 94/60 03/21/23 1032  Temp 36.1 C 03/21/23 1032  Pulse 89 03/21/23 1038  Resp 18 03/21/23 1037  SpO2 96 % 03/21/23 1038  Vitals shown include unvalidated device data.  Last Pain:  Vitals:   03/21/23 0910  TempSrc: Temporal  PainSc: 0-No pain         Complications: No notable events documented.

## 2023-03-21 NOTE — H&P (Signed)
Pre-Procedure H&P   Patient ID: Brandon Bright is a 77 y.o. male.  Gastroenterology Provider: Jaynie Collins, DO  Referring Provider: Tawni Pummel, PA PCP: Wilford Corner, PA-C  Date: 03/21/2023  HPI [name] is a 77 y.o. male who presents today for Colonoscopy for Bright red blood per rectum, and lymphocytic colitis .  Patient with report of bright red blood per rectum after hard stool in March.  He has 2-3 bowel movements per day.  No abdominal pain.  Appetite and weight are stable.  Last underwent colonoscopy September 2019 demonstrating internal hemorrhoids and lymphocytic colitis on biopsy.  He underwent colonoscopies in July 2014 and June 2009 both of which were normal.  February 2060 had polyps per report. No family history of colon cancer or colon polyps  Creatinine 1.1 hemoglobin 14.5 MCV 90 platelets 203,000   Past Medical History:  Diagnosis Date   AGE (acute gastroenteritis) 08/19/2014   Anemia    hx of   ARF (acute renal failure) (HCC) 08/19/2014   Arthritis    osteo in head and neck   Chronic kidney disease    acute renal failue?   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   no issue/ DR Gwen Pounds   CATARACT EXTRACTION W/PHACO Left 02/04/2016   Procedure: CATARACT EXTRACTION PHACO AND INTRAOCULAR LENS PLACEMENT (IOC) LEFT EYE;  Surgeon: Lockie Mola, MD;  Location: [facility];  Service: Ophthalmology;  Laterality: Left;   CATARACT EXTRACTION W/PHACO Right 03/03/2016   Procedure: CATARACT EXTRACTION PHACO AND INTRAOCULAR LENS PLACEMENT (IOC);  Surgeon: Lockie Mola, MD;  Location: [facility];  Service: Ophthalmology;   Laterality: Right;   COLONOSCOPY     COLONOSCOPY WITH PROPOFOL N/A 06/05/2018   Procedure: COLONOSCOPY WITH PROPOFOL;  Surgeon: Scot Jun, MD;  Location: [facility];  Service: Endoscopy;  Laterality: N/A;   EYE SURGERY  May and June   bilateral cataract   ganglion cyst removal Right    PILONIDAL CYST EXCISION      Family History No h/o GI disease or malignancy  Review of Systems  Constitutional:  Negative for activity change, appetite change, chills, diaphoresis, fatigue, fever and unexpected weight change.  HENT:  Negative for trouble swallowing and voice change.   Respiratory:  Negative for shortness of breath and wheezing.   Cardiovascular:  Negative for chest pain, palpitations and leg swelling.  Gastrointestinal:  Positive for blood in stool and constipation. Negative for abdominal distention, abdominal pain, anal bleeding, diarrhea, nausea and vomiting.  Musculoskeletal:  Negative for arthralgias and myalgias.  Skin:  Negative for color change and pallor.  Neurological:  Negative for dizziness, syncope and weakness.  Psychiatric/Behavioral:  Negative for confusion. The patient is not nervous/anxious.   All other systems reviewed and are negative.    Medications No current facility-administered medications on file prior to encounter.   Current Outpatient Medications on File Prior to Encounter  Medication Sig Dispense Refill   ASPIRIN 81 PO Take 81 mg by mouth daily.     cholecalciferol (VITAMIN D3) 25 MCG (1000 UT) tablet Take 1,000 Units by mouth daily.     gabapentin (NEURONTIN) 300 MG capsule  Limit 2-3 tablets by mouth per day or twice per day if tolerated (Patient taking differently: 2 (two) times daily. Limit 2-3 tablets by mouth per day or twice per day if tolerated) 540 capsule 0   Hypertonic Nasal Wash (SINUS RINSE KIT NA) Place into the nose as needed.     ipratropium (ATROVENT) 0.06 % nasal spray Place 2 sprays into both nostrils 4 (four) times daily. 15 mL  12   latanoprost (XALATAN) 0.005 % ophthalmic solution Place 1 drop into both eyes at bedtime.     Multiple Vitamins-Minerals (CENTRUM SILVER 50+MEN) TABS Take 1 tablet by mouth daily.     Multiple Vitamins-Minerals (PRESERVISION AREDS PO) Take 1 tablet by mouth 2 (two) times daily. Am and dinner     simvastatin (ZOCOR) 20 MG tablet Take 20 mg by mouth daily. bedtime     valsartan-hydrochlorothiazide (DIOVAN-HCT) 160-12.5 MG tablet      amLODipine (NORVASC) 10 MG tablet Take 1 tablet by mouth daily.     fluticasone (FLONASE) 50 MCG/ACT nasal spray Place 2 sprays into both nostrils daily. (Patient taking differently: Place 2 sprays into both nostrils daily as needed.) 16 g 0   Melatonin 5 MG CHEW Chew 5 mg by mouth at bedtime as needed.  (Patient not taking: Reported on 03/21/2023)      Pertinent medications related to GI and procedure were reviewed by me with the patient prior to the procedure   Current Facility-Administered Medications:    0.9 %  sodium chloride infusion, , Intravenous, Continuous, Brandon Collins, DO  sodium chloride         Allergies  Allergen Reactions   Codeine Swelling and Rash    throat   Vicodin [Hydrocodone-Acetaminophen] Swelling    throat   Celebrex [Celecoxib]     Abdominal pain   Ultram [Tramadol]    Allergies were reviewed by me prior to the procedure  Objective   Body mass index is 32.64 kg/m. Vitals:   03/21/23 0910  BP: 138/87  Pulse: 85  Resp: 18  Temp: 97.8 F (36.6 C)  TempSrc: Temporal  SpO2: 96%  Weight: 103.2 kg  Height: 5\' 10"  (1.778 m)     Physical Exam Vitals and nursing note reviewed.  Constitutional:      General: He is not in acute distress.    Appearance: Normal appearance. He is obese. He is not ill-appearing, toxic-appearing or diaphoretic.  HENT:     Head: Normocephalic and atraumatic.     Nose: Nose normal.     Mouth/Throat:     Mouth: Mucous membranes are moist.     Pharynx: Oropharynx is clear.   Eyes:     General: No scleral icterus.    Extraocular Movements: Extraocular movements intact.  Cardiovascular:     Rate and Rhythm: Normal rate and regular rhythm.     Heart sounds: Normal heart sounds. No murmur heard.    No friction rub. No gallop.  Pulmonary:     Effort: Pulmonary effort is normal. No respiratory distress.     Breath sounds: Normal breath sounds. No wheezing, rhonchi or rales.  Abdominal:     General: Bowel sounds are normal. There is no distension.     Palpations: Abdomen is soft.     Tenderness: There is no abdominal tenderness. There is no guarding or rebound.  Musculoskeletal:     Cervical back: Neck supple.     Right lower leg: No edema.     Left lower leg: No  edema.  Skin:    General: Skin is warm and dry.     Coloration: Skin is not jaundiced or pale.  Neurological:     General: No focal deficit present.     Mental Status: He is alert and oriented to person, place, and time. Mental status is at baseline.  Psychiatric:        Mood and Affect: Mood normal.        Behavior: Behavior normal.        Thought Content: Thought content normal.        Judgment: Judgment normal.      Assessment:  [name] is a 77 y.o. male  who presents today for Colonoscopy for Bright red blood per rectum, and lymphocytic colitis .  Plan:  Colonoscopy with possible intervention today  Colonoscopy with possible biopsy, control of bleeding, polypectomy, and interventions as necessary has been discussed with the patient/patient representative. Informed consent was obtained from the patient/patient representative after explaining the indication, nature, and risks of the procedure including but not limited to death, bleeding, perforation, missed neoplasm/lesions, cardiorespiratory compromise, and reaction to medications. Opportunity for questions was given and appropriate answers were provided. Patient/patient representative has verbalized understanding is amenable to  undergoing the procedure.   Brandon Collins, DO  [facility]  Portions of the record may have been created with voice recognition software. Occasional wrong-word or 'sound-a-like' substitutions may have occurred due to the inherent limitations of voice recognition software.  Read the chart carefully and recognize, using context, where substitutions may have occurred.

## 2023-03-22 ENCOUNTER — Encounter: Payer: Self-pay | Admitting: Gastroenterology

## 2023-03-22 NOTE — Anesthesia Postprocedure Evaluation (Signed)
Anesthesia Post Note  Patient: Brandon Bright  Procedure(s) Performed: COLONOSCOPY WITH PROPOFOL BIOPSY POLYPECTOMY  Patient location during evaluation: Endoscopy Anesthesia Type: General Level of consciousness: awake and alert Pain management: pain level controlled Vital Signs Assessment: post-procedure vital signs reviewed and stable Respiratory status: spontaneous breathing, nonlabored ventilation, respiratory function stable and patient connected to nasal cannula oxygen Cardiovascular status: blood pressure returned to baseline and stable Postop Assessment: no apparent nausea or vomiting Anesthetic complications: no   No notable events documented.   Last Vitals:  Vitals:   03/21/23 1042 03/21/23 1105  BP: 103/65 124/70  Pulse:  76  Resp:  16  Temp:    SpO2:  95%    Last Pain:  Vitals:   03/22/23 0747  TempSrc:   PainSc: 0-No pain                 Corinda Gubler

## 2023-04-28 ENCOUNTER — Emergency Department: Payer: Medicare PPO

## 2023-04-28 ENCOUNTER — Other Ambulatory Visit: Payer: Self-pay

## 2023-04-28 ENCOUNTER — Observation Stay (HOSPITAL_BASED_OUTPATIENT_CLINIC_OR_DEPARTMENT_OTHER)
Admit: 2023-04-28 | Discharge: 2023-04-28 | Disposition: A | Discharge status: Home / Self Care | Payer: Medicare PPO | Attending: Family Medicine | Admitting: Family Medicine

## 2023-04-28 ENCOUNTER — Observation Stay
Admission: EM | Admit: 2023-04-28 | Discharge: 2023-04-29 | Disposition: A | Discharge status: Home / Self Care | Payer: Medicare PPO | Attending: Internal Medicine | Admitting: Internal Medicine

## 2023-04-28 DIAGNOSIS — I10 Essential (primary) hypertension: Secondary | ICD-10-CM | POA: Diagnosis present

## 2023-04-28 DIAGNOSIS — I129 Hypertensive chronic kidney disease with stage 1 through stage 4 chronic kidney disease, or unspecified chronic kidney disease: Secondary | ICD-10-CM | POA: Insufficient documentation

## 2023-04-28 DIAGNOSIS — Z7982 Long term (current) use of aspirin: Secondary | ICD-10-CM | POA: Insufficient documentation

## 2023-04-28 DIAGNOSIS — N189 Chronic kidney disease, unspecified: Secondary | ICD-10-CM | POA: Diagnosis not present

## 2023-04-28 DIAGNOSIS — E785 Hyperlipidemia, unspecified: Secondary | ICD-10-CM | POA: Diagnosis not present

## 2023-04-28 DIAGNOSIS — I130 Hypertensive heart and chronic kidney disease with heart failure and stage 1 through stage 4 chronic kidney disease, or unspecified chronic kidney disease: Secondary | ICD-10-CM | POA: Insufficient documentation

## 2023-04-28 DIAGNOSIS — G459 Transient cerebral ischemic attack, unspecified: Secondary | ICD-10-CM | POA: Diagnosis not present

## 2023-04-28 DIAGNOSIS — E1169 Type 2 diabetes mellitus with other specified complication: Secondary | ICD-10-CM | POA: Diagnosis not present

## 2023-04-28 DIAGNOSIS — R2990 Unspecified symptoms and signs involving the nervous system: Principal | ICD-10-CM

## 2023-04-28 DIAGNOSIS — M6281 Muscle weakness (generalized): Secondary | ICD-10-CM | POA: Insufficient documentation

## 2023-04-28 DIAGNOSIS — I739 Peripheral vascular disease, unspecified: Secondary | ICD-10-CM | POA: Diagnosis not present

## 2023-04-28 DIAGNOSIS — E119 Type 2 diabetes mellitus without complications: Secondary | ICD-10-CM

## 2023-04-28 DIAGNOSIS — Z79899 Other long term (current) drug therapy: Secondary | ICD-10-CM | POA: Insufficient documentation

## 2023-04-28 DIAGNOSIS — R531 Weakness: Secondary | ICD-10-CM | POA: Diagnosis present

## 2023-04-28 LAB — MAGNESIUM: Magnesium: 2.2 mg/dL (ref 1.7–2.4)

## 2023-04-28 LAB — CBC WITH DIFFERENTIAL/PLATELET
Abs Immature Granulocytes: 0.01 10*3/uL (ref 0.00–0.07)
Basophils Absolute: 0 10*3/uL (ref 0.0–0.1)
Basophils Relative: 1 %
Eosinophils Absolute: 0.3 10*3/uL (ref 0.0–0.5)
Eosinophils Relative: 6 %
HCT: 42.1 % (ref 39.0–52.0)
Hemoglobin: 14 g/dL (ref 13.0–17.0)
Immature Granulocytes: 0 %
Lymphocytes Relative: 29 %
Lymphs Abs: 1.6 10*3/uL (ref 0.7–4.0)
MCH: 31.8 pg (ref 26.0–34.0)
MCHC: 33.3 g/dL (ref 30.0–36.0)
MCV: 95.7 fL (ref 80.0–100.0)
Monocytes Absolute: 0.8 10*3/uL (ref 0.1–1.0)
Monocytes Relative: 14 %
Neutro Abs: 2.9 10*3/uL (ref 1.7–7.7)
Neutrophils Relative %: 50 %
Platelets: 218 10*3/uL (ref 150–400)
RBC: 4.4 MIL/uL (ref 4.22–5.81)
RDW: 13.3 % (ref 11.5–15.5)
WBC: 5.6 10*3/uL (ref 4.0–10.5)
nRBC: 0 % (ref 0.0–0.2)

## 2023-04-28 LAB — ECHOCARDIOGRAM COMPLETE
AR max vel: 2.24 cm2
AV Area VTI: 1.9 cm2
AV Area mean vel: 2.04 cm2
AV Mean grad: 5 mmHg
AV Peak grad: 9.7 mmHg
Ao pk vel: 1.56 m/s
Area-P 1/2: 3 cm2
Height: 70 in
MV VTI: 2.18 cm2
S' Lateral: 2.8 cm
Weight: 3872 oz

## 2023-04-28 LAB — COMPREHENSIVE METABOLIC PANEL
ALT: 24 U/L (ref 0–44)
AST: 21 U/L (ref 15–41)
Albumin: 3.8 g/dL (ref 3.5–5.0)
Alkaline Phosphatase: 66 U/L (ref 38–126)
Anion gap: 10 (ref 5–15)
BUN: 24 mg/dL — ABNORMAL HIGH (ref 8–23)
CO2: 28 mmol/L (ref 22–32)
Calcium: 9.5 mg/dL (ref 8.9–10.3)
Chloride: 103 mmol/L (ref 98–111)
Creatinine, Ser: 1.11 mg/dL (ref 0.61–1.24)
GFR, Estimated: >60 mL/min (ref >60)
Glucose, Bld: 148 mg/dL — ABNORMAL HIGH (ref 70–99)
Potassium: 3.2 mmol/L — ABNORMAL LOW (ref 3.5–5.1)
Sodium: 141 mmol/L (ref 135–145)
Total Bilirubin: 0.4 mg/dL (ref 0.3–1.2)
Total Protein: 7.4 g/dL (ref 6.5–8.1)

## 2023-04-28 LAB — HEMOGLOBIN A1C
Hgb A1c MFr Bld: 6.4 % — ABNORMAL HIGH (ref 4.8–5.6)
Mean Plasma Glucose: 136.98 mg/dL

## 2023-04-28 LAB — TROPONIN I (HIGH SENSITIVITY)
Troponin I (High Sensitivity): 9 ng/L (ref <18)
Troponin I (High Sensitivity): 8 ng/L (ref <18)

## 2023-04-28 LAB — GLUCOSE, CAPILLARY: Glucose-Capillary: 132 mg/dL — ABNORMAL HIGH (ref 70–99)

## 2023-04-28 MED ORDER — HYDROCHLOROTHIAZIDE 12.5 MG PO TABS
12.5000 mg | ORAL_TABLET | Freq: Every day | ORAL | Status: DC
  Administered 2023-04-28 – 2023-04-29 (×2): 12.5 mg via ORAL
  Filled 2023-04-28 (×2): qty 1

## 2023-04-28 MED ORDER — ASPIRIN 81 MG PO CHEW
324.0000 mg | CHEWABLE_TABLET | Freq: Once | ORAL | Status: AC
  Administered 2023-04-28: 324 mg via ORAL
  Filled 2023-04-28: qty 4

## 2023-04-28 MED ORDER — VALSARTAN-HYDROCHLOROTHIAZIDE 160-12.5 MG PO TABS: 1.0000 | ORAL_TABLET | Freq: Every day | ORAL | Status: DC

## 2023-04-28 MED ORDER — ASPIRIN 81 MG PO CHEW
81.0000 mg | CHEWABLE_TABLET | Freq: Every day | ORAL | Status: DC
  Administered 2023-04-28 – 2023-04-29 (×2): 81 mg via ORAL
  Filled 2023-04-28 (×2): qty 1

## 2023-04-28 MED ORDER — CENTRUM SILVER 50+MEN PO TABS: 1.0000 | ORAL_TABLET | Freq: Every day | ORAL | Status: DC

## 2023-04-28 MED ORDER — IOHEXOL 350 MG/ML SOLN
75.0000 mL | Freq: Once | INTRAVENOUS | Status: AC | PRN
  Administered 2023-04-28: 75 mL via INTRAVENOUS

## 2023-04-28 MED ORDER — SODIUM CHLORIDE 0.9 % IV BOLUS
1000.0000 mL | Freq: Once | INTRAVENOUS | Status: AC
  Administered 2023-04-28: 1000 mL via INTRAVENOUS

## 2023-04-28 MED ORDER — AMLODIPINE BESYLATE 10 MG PO TABS
10.0000 mg | ORAL_TABLET | Freq: Every day | ORAL | Status: DC
  Administered 2023-04-28: 10 mg via ORAL
  Filled 2023-04-28: qty 1

## 2023-04-28 MED ORDER — ADULT MULTIVITAMIN W/MINERALS CH
1.0000 | ORAL_TABLET | Freq: Every day | ORAL | Status: DC
  Administered 2023-04-28 – 2023-04-29 (×2): 1 via ORAL
  Filled 2023-04-28 (×2): qty 1

## 2023-04-28 MED ORDER — STROKE: EARLY STAGES OF RECOVERY BOOK: Freq: Once | Status: AC

## 2023-04-28 MED ORDER — LABETALOL HCL 5 MG/ML IV SOLN: 10.0000 mg | INTRAVENOUS | Status: DC | PRN

## 2023-04-28 MED ORDER — ENOXAPARIN SODIUM 60 MG/0.6ML IJ SOSY
55.0000 mg | PREFILLED_SYRINGE | INTRAMUSCULAR | Status: DC
  Administered 2023-04-28: 55 mg via SUBCUTANEOUS
  Filled 2023-04-28: qty 0.6

## 2023-04-28 MED ORDER — IRBESARTAN 150 MG PO TABS
150.0000 mg | ORAL_TABLET | Freq: Every day | ORAL | Status: DC
  Administered 2023-04-28 – 2023-04-29 (×2): 150 mg via ORAL
  Filled 2023-04-28 (×2): qty 1

## 2023-04-28 MED ORDER — CLOPIDOGREL BISULFATE 75 MG PO TABS
75.0000 mg | ORAL_TABLET | Freq: Every day | ORAL | Status: DC
  Administered 2023-04-29: 75 mg via ORAL
  Filled 2023-04-28: qty 1

## 2023-04-28 MED ORDER — CLOPIDOGREL BISULFATE 75 MG PO TABS
300.0000 mg | ORAL_TABLET | Freq: Once | ORAL | Status: AC
  Administered 2023-04-28: 300 mg via ORAL
  Filled 2023-04-28: qty 4

## 2023-04-28 MED ORDER — GABAPENTIN 300 MG PO CAPS
400.0000 mg | ORAL_CAPSULE | Freq: Two times a day (BID) | ORAL | Status: DC
  Administered 2023-04-28 – 2023-04-29 (×3): 400 mg via ORAL
  Filled 2023-04-28 (×3): qty 1

## 2023-04-28 MED ORDER — ATORVASTATIN CALCIUM 20 MG PO TABS
80.0000 mg | ORAL_TABLET | Freq: Every day | ORAL | Status: DC
  Administered 2023-04-28 – 2023-04-29 (×2): 80 mg via ORAL
  Filled 2023-04-28 (×2): qty 4

## 2023-04-28 MED ORDER — POTASSIUM CHLORIDE 20 MEQ PO PACK
40.0000 meq | PACK | Freq: Once | ORAL | Status: AC
  Administered 2023-04-28: 40 meq via ORAL
  Filled 2023-04-28: qty 2

## 2023-04-28 MED ORDER — IPRATROPIUM BROMIDE 0.06 % NA SOLN
2.0000 | Freq: Three times a day (TID) | NASAL | Status: DC
  Administered 2023-04-28 – 2023-04-29 (×2): 2 via NASAL
  Filled 2023-04-28: qty 15

## 2023-04-28 NOTE — Assessment & Plan Note (Signed)
Lipid panel pending High-dose statin

## 2023-04-28 NOTE — Progress Notes (Signed)
Received patient from ER in stable condition. Pt ambulated with steady, but weak gait.

## 2023-04-28 NOTE — Assessment & Plan Note (Signed)
SSI  A1C  

## 2023-04-28 NOTE — ED Provider Notes (Signed)
Care assumed of patient from outgoing provider.  See their note for initial history, exam and plan.  Clinical Course as of 04/28/23 0756  Thu Apr 28, 2023  0710 Concern for small CVA - LNK 10p, weakness of L arm and leg, improvement but ongoing weakness of LLE. Ct head pending.  Plan to admit to hospitalist for TIA/CVA [SM]    Clinical Course User Index [SM] Corena Herter, MD   CT of the head with no emergent vascular findings.  Mild arthrosclerosis except for an advanced left PCA stenosis.  Consulted hospitalist for admission for TIA/CVA workup   Corena Herter, MD 04/28/23 [phone]

## 2023-04-28 NOTE — ED Triage Notes (Addendum)
Pt states he woke up around 0330 to go to the bathroom. Pt states he had weakness in L arm and L leg, but was able to walk to the bathroom. Pt states he almost fell d/t weakness but caught himself. Pt denies any other symptoms. Went to bed @ 2230; denies pmh of stroke.  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

## 2023-04-28 NOTE — Progress Notes (Signed)
PHARMACIST - PHYSICIAN COMMUNICATION  CONCERNING:  Enoxaparin (Lovenox) for DVT Prophylaxis    RECOMMENDATION: Patient was prescribed enoxaprin 40mg  q24 hours for VTE prophylaxis.   Filed Weights   04/28/23 0534  Weight: 109.8 kg (242 lb)    Body mass index is 34.72 kg/m.  Estimated Creatinine Clearance: 66.9 mL/min (by C-G formula based on SCr of 1.11 mg/dL).   Based on [facility] policy patient is candidate for enoxaparin 0.5mg /kg TBW SQ every 24 hours based on BMI being >30.  DESCRIPTION: Pharmacy has adjusted enoxaparin dose per [facility] policy.  Patient is now receiving enoxaparin 0.5 mg/kg every 24 hours   Otelia Sergeant, PharmD, [facility] 04/28/2023 5:14 PM

## 2023-04-28 NOTE — Assessment & Plan Note (Addendum)
Transient left sided weakness from around this morning that has now resolved on evaluation CT head, MRI of the brain as well as CT angio of the head neck grossly stable Will finish TIA evaluation with 2D echo as well as risk stratification labs ABCD 2 score 7 Start DAPT  Statin

## 2023-04-28 NOTE — Evaluation (Signed)
Physical Therapy Evaluation Patient Details Name: Brandon Bright MRN: [id number] DOB: 06-Jun-1946 Today's Date: 04/28/2023  History of Present Illness  Brandon Bright is a 77 y.o. male with medical history significant of hypertension, hyperlipidemia, arthritis, CKD presenting with TIA. Patient reports significant left upper and lower extremity weakness when 7 to go to bathroom around 3 AM today. Had significant difficulty with ambulation and walking on the left side. Denies any prior episodes like this in the past.  Clinical Impression  Pt received in bed, he is agreeable to PT session. Pt able to amb ~200 ft super A for safety with occasional LLE decreased stance time due to LLE weakness but overall good. Pt able to balance on LLE SLS when throwing trash away with no assistance. Pt reports LLE "numbness" when standing/amb. Pt has diminished sensation on LLE specifically below knee joint. Pt will benefit from skilled PT to address functional mobility, amb, stair negotiation, and BLE strength.        If plan is discharge home, recommend the following: A little help with walking and/or transfers;A little help with bathing/dressing/bathroom;Assistance with cooking/housework   Can travel by private vehicle        Equipment Recommendations None recommended by PT  Recommendations for Other Services       Functional Status Assessment Patient has had a recent decline in their functional status and demonstrates the ability to make significant improvements in function in a reasonable and predictable amount of time.     Precautions / Restrictions Precautions Precautions: None Restrictions Weight Bearing Restrictions: No      Mobility  Bed Mobility Overal bed mobility: Independent                  Transfers Overall transfer level: Independent Equipment used: None               General transfer comment: Pt able to transfers with superA for safety     Ambulation/Gait Ambulation/Gait assistance: Supervision Gait Distance (Feet): 200 Feet Assistive device: None Gait Pattern/deviations: WFL(Within Functional Limits), Step-through pattern, Decreased stance time - left Gait velocity: normal     General Gait Details: Pt demonstrates occasional min decreased LLE stance time but overall symmetrical  Stairs            Wheelchair Mobility     Tilt Bed    Modified Rankin (Stroke Patients Only)       Balance Overall balance assessment: Independent                                           Pertinent Vitals/Pain Pain Assessment Pain Assessment: No/denies pain    Home Living Family/patient expects to be discharged to:: Private residence Living Arrangements: Spouse/significant other Available Help at Discharge: Family Type of Home: House Home Access: Stairs to enter Entrance Stairs-Rails: Left (L railing at back entrance, B railings at front entrance) [employer] of Steps: 3 in the back, 2 in the front   Home Layout: One level Home Equipment: Shower seat;Cane - single point Additional Comments: does not use cane at this time, but keeps it inside vehicle from prior need    Prior Function Prior Level of Function : Independent/Modified Independent               ADLs Comments: independent with all ADLs and with work around home     [employer]  Dominant Hand: Right    Extremity/Trunk Assessment   Upper Extremity Assessment Upper Extremity Assessment: Overall WFL for tasks assessed    Lower Extremity Assessment Lower Extremity Assessment: LLE deficits/detail;RLE deficits/detail RLE Deficits / Details: 4+/5 overall MMT RLE Sensation: WNL LLE Deficits / Details: 4/5 overall MMT LLE Sensation: decreased light touch       Communication   Communication: No difficulties  Cognition Arousal/Alertness: Awake/alert Behavior During Therapy: WFL for tasks  assessed/performed Overall Cognitive Status: Within Functional Limits for tasks assessed                                          General Comments General comments (skin integrity, edema, etc.): Pt demonstrates ability to SLS on LLE when throwing paper towel away    Exercises Total Joint Exercises Goniometric ROM: WNL B knee flexion, knee extension, DF/PF   Assessment/Plan    PT Assessment Patient needs continued PT services  PT Problem List Decreased strength;Impaired sensation       PT Treatment Interventions Stair training;Gait training;Therapeutic exercise    PT Goals (Current goals can be found in the Care Plan section)  Acute Rehab PT Goals Patient Stated Goal: return to home PT Goal Formulation: With patient/family Time For Goal Achievement: 05/12/23 Potential to Achieve Goals: Good    Frequency Min 1X/week     Co-evaluation               AM-PAC PT "6 Clicks" Mobility  Outcome Measure Help needed turning from your back to your side while in a flat bed without using bedrails?: None Help needed moving from lying on your back to sitting on the side of a flat bed without using bedrails?: None Help needed moving to and from a bed to a chair (including a wheelchair)?: None Help needed standing up from a chair using your arms (e.g., wheelchair or bedside chair)?: None Help needed to walk in hospital room?: A Little Help needed climbing 3-5 steps with a railing? : A Little 6 Click Score: 22    End of Session Equipment Utilized During Treatment: Gait belt Activity Tolerance: Patient tolerated treatment well Patient left: in bed;with call bell/phone within reach;with bed alarm set;with family/visitor present Nurse Communication: Mobility status PT Visit Diagnosis: Muscle weakness (generalized) (M62.81)    Time: 6160-7371 PT Time Calculation (min) (ACUTE ONLY): 20 min   Charges:   PT Evaluation $PT Eval Low Complexity: 1 Low   PT General  Charges $$ ACUTE PT VISIT: 1 Visit         Keyonta Barradas, PT, GCS 04/28/23,3:08 PM

## 2023-04-28 NOTE — H&P (Addendum)
History and Physical    Patient: Brandon Bright:[id number] DOB: 1946-05-20 DOA: 04/28/2023 DOS: the patient was seen and examined on 04/28/2023 PCP: Wilford Corner, PA-C  Patient coming from: Home  Chief Complaint:  Chief Complaint  Patient presents with   Weakness    Weakness in l arm and leg around 0330, full strength now   HPI: Brandon Bright is a 77 y.o. male with medical history significant of hypertension, hyperlipidemia, arthritis, CKD presenting with TIA.  Patient reports significant left upper and lower extremity weakness when 7 to go to bathroom around 3 AM today.  Had significant difficulty with ambulation and walking on the left side.  Denies any prior episodes like this in the past.  No reported dysarthria, confusion, headache or vision changes.  Takes baby aspirin on a regular basis.  Baseline history of hypertension, hyperlipidemia that has been fairly well-controlled per patient.  Symptoms persisted for several minutes per the patient.  No chest pain or shortness of breath.  No nausea or vomiting.  EMS arrived around 430 this morning.  Symptoms significantly improved when patient got up to the stretcher though with some trace weakness. Presented to the ER afebrile, hemodynamically stable.  White count 5.6, hemoglobin 14, platelets 218.  Creatinine 1.1, glucose 148.  Potassium 3.2.  Review of Systems: As mentioned in the history of present illness. All other systems reviewed and are negative. 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   no issue/ DR Gwen Pounds   CATARACT EXTRACTION W/PHACO Left 02/04/2016   Procedure: CATARACT EXTRACTION PHACO AND INTRAOCULAR LENS PLACEMENT (IOC) LEFT EYE;  Surgeon: Lockie Mola, MD;  Location: [facility];  Service: Ophthalmology;  Laterality: Left;   CATARACT EXTRACTION W/PHACO Right 03/03/2016   Procedure: CATARACT EXTRACTION PHACO AND INTRAOCULAR LENS PLACEMENT (IOC);  Surgeon: Lockie Mola, MD;  Location: [facility];  Service: Ophthalmology;  Laterality: Right;   COLONOSCOPY     COLONOSCOPY WITH PROPOFOL N/A 06/05/2018   Procedure: COLONOSCOPY WITH PROPOFOL;  Surgeon: Scot Jun, MD;  Location: [facility] ENDOSCOPY;  Service: Endoscopy;  Laterality: N/A;   COLONOSCOPY WITH PROPOFOL N/A 03/21/2023   Procedure: COLONOSCOPY WITH PROPOFOL;  Surgeon: Jaynie Collins, DO;  Location: [facility] ENDOSCOPY;  Service: Gastroenterology;  Laterality: N/A;   EYE SURGERY  May and June   bilateral cataract   ganglion cyst removal Right    PILONIDAL CYST EXCISION     POLYPECTOMY  03/21/2023   Procedure: POLYPECTOMY;  Surgeon: Jaynie Collins, DO;  Location: [facility] ENDOSCOPY;  Service: Gastroenterology;;   Social History:  reports that he has never smoked. He has never used smokeless tobacco. He reports that he does not drink alcohol and does not use drugs.  Allergies  Allergen Reactions  Codeine Swelling and Rash    throat   Vicodin [Hydrocodone-Acetaminophen] Swelling    throat   Celebrex [Celecoxib]     Abdominal pain   Ultram [Tramadol]     Family History  Problem Relation Age of Onset   Heart disease Father    Dementia Mother    Heart disease Brother    Heart disease Brother    Diabetes Brother     Prior to Admission medications    Medication Sig Start Date End Date Taking? Authorizing Provider  amLODipine (NORVASC) 10 MG tablet Take 1 tablet by mouth daily. 11/11/21 04/28/23 Yes [provider]  ASPIRIN 81 PO Take 81 mg by mouth daily.   Yes [provider]  gabapentin (NEURONTIN) 300 MG capsule Limit 2-3 tablets by mouth per day or twice per day if tolerated Patient taking differently: 2 (two) times daily. Limit 2-3 tablets by mouth per day or twice per day if tolerated 03/25/16  Yes Ewing Schlein, MD  ipratropium (ATROVENT) 0.06 % nasal spray Place 2 sprays into both nostrils 4 (four) times daily. 09/17/21  Yes Becky Augusta, NP  latanoprost (XALATAN) 0.005 % ophthalmic solution Place 1 drop into both eyes at bedtime.   Yes [provider]  Multiple Vitamins-Minerals (CENTRUM SILVER 50+MEN) TABS Take 1 tablet by mouth daily.   Yes [provider]  simvastatin (ZOCOR) 20 MG tablet Take 20 mg by mouth daily. bedtime   Yes [provider]  valsartan-hydrochlorothiazide (DIOVAN-HCT) 160-12.5 MG tablet  02/06/20  Yes [provider]  cholecalciferol (VITAMIN D3) 25 MCG (1000 UT) tablet Take 1,000 Units by mouth daily.    [provider]  fluticasone (FLONASE) 50 MCG/ACT nasal spray Place 2 sprays into both nostrils daily. Patient not taking: Reported on 04/28/2023 05/25/18   Lutricia Feil, PA-C  Hypertonic Nasal Wash (SINUS RINSE KIT NA) Place into the nose as needed.    [provider]  Melatonin 5 MG CHEW Chew 5 mg by mouth at bedtime as needed.  Patient not taking: Reported on 03/21/2023    [provider]  Multiple Vitamins-Minerals (PRESERVISION AREDS PO) Take 1 tablet by mouth 2 (two) times daily. Am and dinner Patient not taking: Reported on 04/28/2023    [provider]    Physical Exam: Vitals:   04/28/23 1000 04/28/23 1030 04/28/23 1136 04/28/23 1139  BP: (!) 132/90 118/81 (!) 134/94 126/76  Pulse: 71 82 75 73  Resp: 13 (!) 30  12   Temp:   97.6 F (36.4 C)   TempSrc:   Oral   SpO2: (!) 89% 96% 95%   Weight:      Height:       Physical Exam Constitutional:      Appearance: He is normal weight.  HENT:     Head: Normocephalic and atraumatic.     Nose: Nose normal.     Mouth/Throat:     Mouth: Mucous membranes are moist.  Eyes:     Pupils: Pupils are equal, round, and reactive to light.  Cardiovascular:     Rate and Rhythm: Normal rate and regular rhythm.  Pulmonary:     Effort: Pulmonary effort is normal.  Abdominal:     General: Bowel sounds are normal.  Musculoskeletal:     Cervical back: Normal range of motion.     Comments: Trace left upper and lower extremity weakness  Skin:    General: Skin is warm.  Neurological:     Comments: Trace left  upper and lower extremity weakness Otherwise grossly stable neuroexam  Psychiatric:        Mood and Affect: Mood normal.     Data Reviewed:  There are no new results to review at this time. MR BRAIN WO CONTRAST CLINICAL DATA:  Neuro deficit, acute, stroke suspected  EXAM: MRI HEAD WITHOUT CONTRAST  TECHNIQUE: Multiplanar, multiecho pulse sequences of the brain and surrounding structures were obtained without intravenous contrast.  COMPARISON:  MRI head 10/25/2018.  FINDINGS: Brain: No acute infarction, hemorrhage, hydrocephalus, extra-axial collection or mass lesion. Moderate T2/FLAIR hyperintensities the white matter, nonspecific but compatible with chronic microvascular ischemic change.  Vascular: Major arterial flow voids are maintained at the skull base.  Skull and upper cervical spine: Normal marrow signal.  Sinuses/Orbits: Right maxillary sinus retention cyst. Otherwise, clear sinuses. No acute orbital findings.  Other: No mastoid effusions.  IMPRESSION: No evidence of acute intracranial abnormality.  Electronically Signed   By: Feliberto Harts M.D.   On: 04/28/2023 08:09 CT ANGIO HEAD NECK W WO CM CLINICAL DATA:   Weakness in the left arm and leg.  EXAM: CT ANGIOGRAPHY HEAD AND NECK WITH AND WITHOUT CONTRAST  TECHNIQUE: Multidetector CT imaging of the head and neck was performed using the standard protocol during bolus administration of intravenous contrast. Multiplanar CT image reconstructions and MIPs were obtained to evaluate the vascular anatomy. Carotid stenosis measurements (when applicable) are obtained utilizing NASCET criteria, using the distal internal carotid diameter as the denominator.  RADIATION DOSE REDUCTION: This exam was performed according to the departmental dose-optimization program which includes automated exposure control, adjustment of the mA and/or kV according to patient size and/or use of iterative reconstruction technique.  CONTRAST:  75mL OMNIPAQUE IOHEXOL 350 MG/ML SOLN  COMPARISON:  Brain MRI 10/25/2018  FINDINGS: CT HEAD FINDINGS  Brain: No evidence of acute infarction, hemorrhage, hydrocephalus, extra-axial collection or mass lesion/mass effect. Moderate low-density in the cerebral white matter attributed to chronic small vessel ischemia. Mild for age cerebral volume loss.  Vascular: See below  Skull: Negative  Sinuses/Orbits: No acute finding.  Review of the MIP images confirms the above findings  CTA NECK FINDINGS  Aortic arch: Atheromatous plaque. Three vessel branching. No acute finding  Right carotid system: Mixed density plaque at the bifurcation without stenosis or ulceration.  Left carotid system: Mixed density plaque at the bifurcation primarily. No stenosis or ulceration  Vertebral arteries: Proximal subclavian atherosclerosis without significant stenosis. The left vertebral artery is dominant. No vertebral stenosis or beading  Skeleton: Ordinary cervical spine degeneration.  Other neck: No acute or aggressive finding.  Upper chest: Clear apical lungs  Review of the MIP images confirms the above findings  CTA HEAD  FINDINGS  Anterior circulation: Atheromatous plaque along the carotid siphons. No branch occlusion, beading, proximal flow limiting stenosis or aneurysm.  Posterior circulation: The vertebral and basilar arteries are smoothly contoured and widely patent. High-grade left P2/3 segment stenosis. No branch occlusion, beading, or aneurysm  Venous sinuses: Unremarkable for arterial timing.  Anatomic variants: None significant  Review of the MIP images confirms the above findings  IMPRESSION: 1. No emergent vascular finding. 2. Overall mild atherosclerosis except for an advanced left PCA stenosis. 3. Chronic small vessel ischemia in the cerebral white matter.  Electronically Signed   By: Tiburcio Pea M.D.   On: 04/28/2023 07:44 DG Chest Port 1 View CLINICAL DATA:  Weakness.  EXAM: PORTABLE CHEST 1 VIEW  COMPARISON:  09/17/2021  FINDINGS: Stable cardiomediastinal contours. Aortic atherosclerotic calcifications.  Decreased lung volumes. Scarring is again seen in the left lung base. No pleural effusion, interstitial edema or consolidation.  IMPRESSION: Low lung volumes. No acute findings.  Electronically Signed   By: Signa Kell M.D.   On: 04/28/2023 06:27  Lab Results  Component Value Date   WBC 5.6 04/28/2023   HGB 14.0 04/28/2023   HCT 42.1 04/28/2023   MCV 95.7 04/28/2023   PLT 218 04/28/2023   Last metabolic panel Lab Results  Component Value Date   GLUCOSE 148 (H) 04/28/2023   NA 141 04/28/2023   K 3.2 (L) 04/28/2023   CL 103 04/28/2023   CO2 28 04/28/2023   BUN 24 (H) 04/28/2023   CREATININE 1.11 04/28/2023   GFRNONAA >60 04/28/2023   CALCIUM 9.5 04/28/2023   PROT 7.4 04/28/2023   ALBUMIN 3.8 04/28/2023   BILITOT 0.4 04/28/2023   ALKPHOS 66 04/28/2023   AST 21 04/28/2023   ALT 24 04/28/2023   ANIONGAP 10 04/28/2023    Assessment and Plan: * TIA (transient ischemic attack) Transient left sided weakness from around this morning that has now  resolved on evaluation CT head, MRI of the brain as well as CT angio of the head neck grossly stable Will finish TIA evaluation with 2D echo as well as risk stratification labs ABCD 2 score 7 Start DAPT  Statin    Type 2 diabetes mellitus without complication, without long-term current use of insulin (HCC) SSI  A1C   Hypertension MRI negative for acute CVA  Cont home BP regimen   Hyperlipidemia Lipid panel pending  High dose statin     Greater than 50% was spent in counseling and coordination of care with patient Total encounter time 80 minutes or more    Advance Care Planning:   Code Status: Full Code   Consults: None   Family Communication: Wife at the bedside   Severity of Illness: The appropriate patient status for this patient is OBSERVATION. Observation status is judged to be reasonable and necessary in order to provide the required intensity of service to ensure the patient's safety. The patient's presenting symptoms, physical exam findings, and initial radiographic and laboratory data in the context of their medical condition is felt to place them at decreased risk for further clinical deterioration. Furthermore, it is anticipated that the patient will be medically stable for discharge from the hospital within 2 midnights of admission.   Author: Floydene Flock, MD 04/28/2023 1:04 PM  For on call review [email or web address].

## 2023-04-28 NOTE — Progress Notes (Signed)
SLP Cancellation Note  Patient Details Name: Brandon Bright MRN: [id number] DOB: 06-13-1946   Cancelled treatment:       Reason Eval/Treat Not Completed: SLP screened, no needs identified, will sign off (Per chart review, no concern for speech/language/cognitive changes. NIHSS =0. MRI negative. Cognitive-linguistic evaluation not warranted at this time.)  [name], M.S., CCC-SLP Speech-Language Pathologist [facility] [phone] Arnette Felts)  Woodroe Chen 04/28/2023, 12:27 PM

## 2023-04-28 NOTE — Assessment & Plan Note (Addendum)
MRI negative for acute CVA  Cont home BP regimen

## 2023-04-28 NOTE — ED Provider Notes (Signed)
[facility] Provider Note    Event Date/Time   First MD Initiated Contact with Patient 04/28/23 0542     (approximate)   History   Weakness (Weakness in l arm and leg around 0330, full strength now)   HPI  Brandon Bright is a 77 y.o. male here with weakness of the left arm and leg.  Patient states that he was last normal around 1030 when he went to bed.  He felt fine at the time.  He woke up around 3:00, and tried to get out of bed and noticed significant left arm and left leg weakness.  He states that he struggled to get to the bathroom and his left leg essentially felt like it was completely dead.  He states he had to use his body to move it.  He states he returned back to his bed and notified his wife.  When EMS arrived around 430, his symptoms had significantly improved although when he got off to go to the stretcher he did notice some left-sided leg weakness.  He continues to feel this but otherwise denies any ongoing symptoms.  No visual changes.  No aphasia.  No neglect.  No other complaints.  No history of stroke.     Physical Exam   Triage Vital Signs: ED Triage Vitals  Encounter Vitals Group     BP 04/28/23 0533 125/77     Systolic BP Percentile --      Diastolic BP Percentile --      Pulse Rate 04/28/23 0533 84     Resp 04/28/23 0533 18     Temp 04/28/23 0533 98.5 F (36.9 C)     Temp Source 04/28/23 0533 Oral     SpO2 04/28/23 0518 91 %     Weight 04/28/23 0534 242 lb (109.8 kg)     Height 04/28/23 0534 5\' 10"  (1.778 m)     Head Circumference --      Peak Flow --      Pain Score 04/28/23 0534 0     Pain Loc --      Pain Education --      Exclude from Growth Chart --     Most recent vital signs: Vitals:   04/28/23 0800 04/28/23 0830  BP: 137/84 (!) 147/85  Pulse: 79 80  Resp: (!) 42 17  Temp:    SpO2: (!) 82% 91%     General: Awake, no distress.  CV:  Good peripheral perfusion.  Resp:  Normal work of breathing.  Abd:  No  distention.  Other:  Cranial nerves II through XII individually tested and intact.  Pupils are equal, round, and reactive.  Alert and oriented x 4.  Motor strength out of 5 bilateral upper extremities.   ED Results / Procedures / Treatments   Labs (all labs ordered are listed, but only abnormal results are displayed) Labs Reviewed  COMPREHENSIVE METABOLIC PANEL - Abnormal; Notable for the following components:      Result Value   Potassium 3.2 (*)    Glucose, Bld 148 (*)    BUN 24 (*)    All other components within normal limits  CBC WITH DIFFERENTIAL/PLATELET  URINALYSIS, ROUTINE W REFLEX MICROSCOPIC  TROPONIN I (HIGH SENSITIVITY)  TROPONIN I (HIGH SENSITIVITY)     EKG Normal sinus rhythm, VR 84. PR 199, QRS 148, QTc 495. No acute ST elevatoins. RBBB and LAFB.   RADIOLOGY CXR: Clear CT Head: Pending, on my review no  intracranial hemorrhage   I also independently reviewed and agree with radiologist interpretations.   PROCEDURES:  Critical Care performed: No    MEDICATIONS ORDERED IN ED: Medications  sodium chloride 0.9 % bolus 1,000 mL (1,000 mLs Intravenous New Bag/Given 04/28/23 0651)  iohexol (OMNIPAQUE) 350 MG/ML injection 75 mL (75 mLs Intravenous Contrast Given 04/28/23 0720)  aspirin chewable tablet 324 mg (324 mg Oral Given 04/28/23 0810)     IMPRESSION / MDM / ASSESSMENT AND PLAN / ED COURSE  I reviewed the triage vital signs and the nursing notes.                              Differential diagnosis includes, but is not limited to, CVA, TIA, mass/lesion, ICH  Patient's presentation is most consistent with acute presentation with potential threat to life or bodily function.  The patient is on the cardiac monitor to evaluate for evidence of arrhythmia and/or significant heart rate changes  Clinical Course as of 04/28/23 0908  Thu Apr 28, 2023  0710 Concern for small CVA - LNK 10p, weakness of L arm and leg, improvement but ongoing weakness of LLE. Ct head  pending.  Plan to admit to hospitalist for TIA/CVA [SM]    Clinical Course User Index [SM] Corena Herter, MD   77 yo M here with left arm and leg weakness, now mostly resolved though mild LLE weakness persists. Outside of tPA window and VAN negative. CT head obtained, no large bleed noted on my prelim review. Plan to admit to medicine once labs return for suspected small CVA vs TIA. Pt updated and in agreement.   FINAL CLINICAL IMPRESSION(S) / ED DIAGNOSES   Final diagnoses:  Stroke-like episode     Rx / DC Orders   ED Discharge Orders     None        Note:  This document was prepared using Dragon voice recognition software and may include unintentional dictation errors.   Shaune Pollack, MD 04/28/23 [phone]

## 2023-04-28 NOTE — Progress Notes (Signed)
*  PRELIMINARY RESULTS* Echocardiogram 2D Echocardiogram has been performed.  Brandon Bright 04/28/2023, 2:17 PM

## 2023-04-28 NOTE — Progress Notes (Signed)
OT Cancellation Note  Patient Details Name: Brandon Bright MRN: [id number] DOB: 1945-10-19   Cancelled Treatment:    Reason Eval/Treat Not Completed: OT screened, no needs identified, will sign off. Consult received chart reviewed. Spoke with PT. Pt nearly back to baseline. No functional deficits appreciated. No skilled OT services indicated at this time. Will sign off.   Arman Filter., MPH, MS, OTR/L ascom [phone] 04/28/23, 3:55 PM

## 2023-04-28 NOTE — ED Notes (Signed)
ED Provider at bedside. 

## 2023-04-29 DIAGNOSIS — G459 Transient cerebral ischemic attack, unspecified: Secondary | ICD-10-CM | POA: Diagnosis not present

## 2023-04-29 LAB — BASIC METABOLIC PANEL
Anion gap: 10 (ref 5–15)
BUN: 27 mg/dL — ABNORMAL HIGH (ref 8–23)
CO2: 26 mmol/L (ref 22–32)
Calcium: 9.6 mg/dL (ref 8.9–10.3)
Chloride: 105 mmol/L (ref 98–111)
Creatinine, Ser: 0.96 mg/dL (ref 0.61–1.24)
GFR, Estimated: >60 mL/min (ref >60)
Glucose, Bld: 122 mg/dL — ABNORMAL HIGH (ref 70–99)
Potassium: 3.6 mmol/L (ref 3.5–5.1)
Sodium: 141 mmol/L (ref 135–145)

## 2023-04-29 MED ORDER — POTASSIUM CHLORIDE CRYS ER 20 MEQ PO TBCR
20.0000 meq | EXTENDED_RELEASE_TABLET | Freq: Once | ORAL | Status: AC
  Administered 2023-04-29: 20 meq via ORAL
  Filled 2023-04-29: qty 1

## 2023-04-29 MED ORDER — ATORVASTATIN CALCIUM 80 MG PO TABS: 80.0000 mg | ORAL_TABLET | Freq: Every day | ORAL | 0 refills | Status: AC

## 2023-04-29 MED ORDER — POTASSIUM CHLORIDE CRYS ER 10 MEQ PO TBCR: 10.0000 meq | EXTENDED_RELEASE_TABLET | Freq: Every day | ORAL | 0 refills | Status: AC

## 2023-04-29 MED ORDER — CLOPIDOGREL BISULFATE 75 MG PO TABS: 75.0000 mg | ORAL_TABLET | Freq: Every day | ORAL | 0 refills | Status: AC

## 2023-04-29 NOTE — Plan of Care (Signed)
  Problem: Education: Goal: Knowledge of General Education information will improve Description: Including pain rating scale, medication(s)/side effects and non-pharmacologic comfort measures Outcome: Progressing   Problem: Education: Goal: Knowledge of disease or condition will improve Outcome: Progressing Goal: Knowledge of secondary prevention will improve (MUST DOCUMENT ALL) Outcome: Progressing Goal: Knowledge of patient specific risk factors will improve Loraine Leriche N/A or DELETE if not current risk factor) Outcome: Progressing   Problem: Ischemic Stroke/TIA Tissue Perfusion: Goal: Complications of ischemic stroke/TIA will be minimized Outcome: Progressing   Problem: Coping: Goal: Will identify appropriate support needs Outcome: Progressing   Problem: Health Behavior/Discharge Planning: Goal: Ability to manage health-related needs will improve Outcome: Progressing Goal: Goals will be collaboratively established with patient/family Outcome: Progressing   Problem: Self-Care: Goal: Ability to participate in self-care as condition permits will improve Outcome: Progressing Goal: Ability to communicate needs accurately will improve Outcome: Progressing   Problem: Nutrition: Goal: Risk of aspiration will decrease Outcome: Progressing

## 2023-04-29 NOTE — Care Management Obs Status (Signed)
MEDICARE OBSERVATION STATUS NOTIFICATION   Patient Details  Name: [name] MRN: [id number] Date of Birth: [birth year]   Medicare Observation Status Notification Given:  Yes     , LCSW 04/29/2023, 10:55 AM

## 2023-05-01 NOTE — Discharge Summary (Signed)
Physician Discharge Summary   Patient: Brandon Bright MRN: [id number] DOB: 1946-08-04  Admit date:     04/28/2023  Discharge date: 04/29/2023  Discharge Physician: Baldwin Jamaica   PCP: Wilford Corner, PA-C   Recommendations at discharge:    Follow up with your primary care physician in one week for medical management and to ensure resolution of symptoms.  Discharge Diagnoses: Principal Problem:   TIA (transient ischemic attack) Active Problems:   Hyperlipidemia   Hypertension   Type 2 diabetes mellitus without complication, without long-term current use of insulin (HCC)  Resolved Problems:   * No resolved hospital problems. [facility] Course: HPI per Dr. Alvester Morin: "Brandon Bright is a 77 y.o. male with medical history significant of hypertension, hyperlipidemia, arthritis, CKD presenting with TIA.  Patient reports significant left upper and lower extremity weakness when 7 to go to bathroom around 3 AM today.  Had significant difficulty with ambulation and walking on the left side.  Denies any prior episodes like this in the past.  No reported dysarthria, confusion, headache or vision changes.  Takes baby aspirin on a regular basis.  Baseline history of hypertension, hyperlipidemia that has been fairly well-controlled per patient.  Symptoms persisted for several minutes per the patient.  No chest pain or shortness of breath.  No nausea or vomiting.  EMS arrived around 430 this morning.  Symptoms significantly improved when patient got up to the stretcher though with some trace weakness. Presented to the ER afebrile, hemodynamically stable.  White count 5.6, hemoglobin 14, platelets 218.  Creatinine 1.1, glucose 148.  Potassium 3.2"  Assessment and Plan:  Left Sided Weakness resolved - secondary to TIA: Patient was on Aspirin prior to onset of symptoms.  Head CT showed no acute abnormality.  CTA of head and neck showed no occlusion.  MRI of brain showed no intracranial abnormality.   Echo and telemetry showed no evidence of Afib.  LA was normal in size.  There was no PFO. - Given NIHSS < 5 and ABCD > 6, we will treat with DAPT x 30 days followed by Plavix monotherapy (THALES trial). - Continue physical therapy outpatient.  Grade 1 Diastolic Heart Failure: Echo showed EF 60-65%. - Euvolemic.  Essential Hypertension: - BP was stable.  Hyperlipidemia: - Statin.  Diabetes Mellitus Type 2: - Glucose was stable.  Possible OSA:  - Recommend sleep study outpatient.  Morbid Obesity: BMI 34.72 kg/m2. - Counseled on weight loss through diet and lifestyle modifications.  Consultants: Teleneurology Procedures performed: None Disposition: Home Diet recommendation:  Discharge Diet Orders (From admission, onward)     Start     Ordered   04/29/23 0000  Diet - low sodium heart healthy        04/29/23 1049           Cardiac diet DISCHARGE MEDICATION: Allergies as of 04/29/2023       Reactions   Codeine Swelling, Rash   throat   Vicodin [hydrocodone-acetaminophen] Swelling   throat   Celebrex [celecoxib]    Abdominal pain   Ultram [tramadol]         Medication List     STOP taking these medications    simvastatin 20 MG tablet Commonly known as: ZOCOR       TAKE these medications    amLODipine 10 MG tablet Commonly known as: NORVASC Take 1 tablet by mouth daily.   ASPIRIN 81 PO Take 81 mg by mouth daily.   atorvastatin 80 MG  tablet Commonly known as: LIPITOR Take 1 tablet (80 mg total) by mouth daily.   cholecalciferol 25 MCG (1000 UNIT) tablet Commonly known as: VITAMIN D3 Take 1,000 Units by mouth daily.   clopidogrel 75 MG tablet Commonly known as: PLAVIX Take 1 tablet (75 mg total) by mouth daily.   fluticasone 50 MCG/ACT nasal spray Commonly known as: FLONASE Place 2 sprays into both nostrils daily.   gabapentin 300 MG capsule Commonly known as: NEURONTIN Limit 2-3 tablets by mouth per day or twice per day if  tolerated What changed: when to take this   ipratropium 0.06 % nasal spray Commonly known as: ATROVENT Place 2 sprays into both nostrils 4 (four) times daily.   latanoprost 0.005 % ophthalmic solution Commonly known as: XALATAN Place 1 drop into both eyes at bedtime.   Melatonin 5 MG Chew Chew 5 mg by mouth at bedtime as needed.   potassium chloride 10 MEQ tablet Commonly known as: KLOR-CON M Take 1 tablet (10 mEq total) by mouth daily for 7 days.   PRESERVISION AREDS PO Take 1 tablet by mouth 2 (two) times daily. Am and dinner   Centrum Silver 50+Men Tabs Take 1 tablet by mouth daily.   SINUS RINSE KIT NA Place into the nose as needed.   valsartan-hydrochlorothiazide 160-12.5 MG tablet Commonly known as: DIOVAN-HCT        Discharge Exam: Filed Weights   04/28/23 0534  Weight: 109.8 kg   Physical Exam Constitutional:      General: He is not in acute distress. HENT:     Head: Normocephalic and atraumatic.     Mouth/Throat:     Mouth: Mucous membranes are moist.  Eyes:     Extraocular Movements: Extraocular movements intact.     Pupils: Pupils are equal, round, and reactive to light.  Cardiovascular:     Rate and Rhythm: Normal rate and regular rhythm.     Pulses: Normal pulses.     Heart sounds: Normal heart sounds.  Pulmonary:     Effort: Pulmonary effort is normal.     Breath sounds: Normal breath sounds.  Abdominal:     General: There is no distension.     Palpations: Abdomen is soft.     Tenderness: There is no abdominal tenderness.  Musculoskeletal:     Cervical back: Normal range of motion and neck supple.  Skin:    General: Skin is warm and dry.     Capillary Refill: Capillary refill takes less than 2 seconds.  Neurological:     General: No focal deficit present.      Condition at discharge: stable  The results of significant diagnostics from this hospitalization (including imaging, microbiology, ancillary and laboratory) are listed below  for reference.   Imaging Studies: ECHOCARDIOGRAM COMPLETE  Result Date: 04/28/2023    ECHOCARDIOGRAM REPORT   Patient Name:   Brandon Bright Sweetman Date of Exam: 04/28/2023 Medical Rec #:  [id number]       Height:       70.0 in Accession #:    [id number]      Weight:       242.0 lb Date of Birth:  [birth year]        BSA:          2.263 m Patient Age:    77 years        BP:           126/76 mmHg Patient Gender: M  HR:           77 bpm. Exam Location:  [facility] Procedure: 2D Echo, Cardiac Doppler and Color Doppler Indications:     TIA  History:         Patient has no prior history of Echocardiogram examinations.                  TIA, Arrythmias:RBBB; Risk Factors:Hypertension, Diabetes and                  Dyslipidemia.  Sonographer:     Mikki Harbor Referring Phys:  [phone] Francoise Schaumann [place] Diagnosing Phys: Julien Nordmann MD IMPRESSIONS  1. Left ventricular ejection fraction, by estimation, is 60 to 65%. The left ventricle has normal function. The left ventricle has no regional wall motion abnormalities. Left ventricular diastolic parameters are consistent with Grade I diastolic dysfunction (impaired relaxation).  2. Right ventricular systolic function is normal. The right ventricular size is normal. There is mildly elevated pulmonary artery systolic pressure. The estimated right ventricular systolic pressure is 37.1 mmHg.  3. The mitral valve is normal in structure. Mild mitral valve regurgitation. No evidence of mitral stenosis.  4. The aortic valve is tricuspid. Aortic valve regurgitation is not visualized. Aortic valve sclerosis/calcification is present, without any evidence of aortic stenosis. Aortic valve mean gradient measures 5.0 mmHg.  5. There is mild dilatation of the aortic root, measuring 40 mm. There is mild dilatation of the ascending aorta, measuring 40 mm.  6. The inferior vena cava is normal in size with greater than 50% respiratory variability, suggesting right atrial pressure of 3 mmHg. FINDINGS   Left Ventricle: Left ventricular ejection fraction, by estimation, is 60 to 65%. The left ventricle has normal function. The left ventricle has no regional wall motion abnormalities. The left ventricular internal cavity size was normal in size. There is  no left ventricular hypertrophy. Left ventricular diastolic parameters are consistent with Grade I diastolic dysfunction (impaired relaxation). Right Ventricle: The right ventricular size is normal. No increase in right ventricular wall thickness. Right ventricular systolic function is normal. There is mildly elevated pulmonary artery systolic pressure. The tricuspid regurgitant velocity is 2.92  m/s, and with an assumed right atrial pressure of 3 mmHg, the estimated right ventricular systolic pressure is 37.1 mmHg. Left Atrium: Left atrial size was normal in size. Right Atrium: Right atrial size was normal in size. Pericardium: There is no evidence of pericardial effusion. Mitral Valve: The mitral valve is normal in structure. Mild mitral annular calcification. Mild mitral valve regurgitation. No evidence of mitral valve stenosis. MV peak gradient, 5.7 mmHg. The mean mitral valve gradient is 3.0 mmHg. Tricuspid Valve: The tricuspid valve is normal in structure. Tricuspid valve regurgitation is mild . No evidence of tricuspid stenosis. Aortic Valve: The aortic valve is tricuspid. Aortic valve regurgitation is not visualized. Aortic valve sclerosis/calcification is present, without any evidence of aortic stenosis. Aortic valve mean gradient measures 5.0 mmHg. Aortic valve peak gradient measures 9.7 mmHg. Aortic valve area, by VTI measures 1.90 cm. Pulmonic Valve: The pulmonic valve was normal in structure. Pulmonic valve regurgitation is mild. No evidence of pulmonic stenosis. Aorta: The aortic root is normal in size and structure. There is mild dilatation of the aortic root, measuring 40 mm. There is mild dilatation of the ascending aorta, measuring 40 mm. Venous:  The inferior vena cava is normal in size with greater than 50% respiratory variability, suggesting right atrial pressure of 3 mmHg. IAS/Shunts: No atrial level  shunt detected by color flow Doppler.  LEFT VENTRICLE PLAX 2D LVIDd:         4.20 cm   Diastology LVIDs:         2.80 cm   LV e' medial:    6.09 cm/s LV PW:         1.20 cm   LV E/e' medial:  14.0 LV IVS:        1.10 cm   LV e' lateral:   8.05 cm/s LVOT diam:     2.00 cm   LV E/e' lateral: 10.6 LV SV:         72 LV SV Index:   32 LVOT Area:     3.14 cm  RIGHT VENTRICLE RV Basal diam:  3.60 cm RV Mid diam:    3.60 cm RV S prime:     12.90 cm/s TAPSE (M-mode): 2.3 cm LEFT ATRIUM             Index        RIGHT ATRIUM           Index LA diam:        4.50 cm 1.99 cm/m   RA Area:     16.70 cm LA Vol (A2C):   49.7 ml 21.96 ml/m  RA Volume:   37.00 ml  16.35 ml/m LA Vol (A4C):   45.2 ml 19.97 ml/m LA Biplane Vol: 49.3 ml 21.78 ml/m  AORTIC VALVE                     PULMONIC VALVE AV Area (Vmax):    2.24 cm      PV Vmax:       1.12 m/s AV Area (Vmean):   2.04 cm      PV Peak grad:  5.0 mmHg AV Area (VTI):     1.90 cm AV Vmax:           156.00 cm/s AV Vmean:          111.000 cm/s AV VTI:            0.377 m AV Peak Grad:      9.7 mmHg AV Mean Grad:      5.0 mmHg LVOT Vmax:         111.00 cm/s LVOT Vmean:        72.100 cm/s LVOT VTI:          0.228 m LVOT/AV VTI ratio: 0.60  AORTA Ao Root diam: 4.00 cm Ao Asc diam:  4.00 cm MITRAL VALVE                TRICUSPID VALVE MV Area (PHT): 3.00 cm     TR Peak grad:   34.1 mmHg MV Area VTI:   2.18 cm     TR Vmax:        292.00 cm/s MV Peak grad:  5.7 mmHg MV Mean grad:  3.0 mmHg     SHUNTS MV Vmax:       1.19 m/s     Systemic VTI:  0.23 m MV Vmean:      77.0 cm/s    Systemic Diam: 2.00 cm MV Decel Time: 253 msec MV E velocity: 85.40 cm/s MV A velocity: 104.00 cm/s MV E/A ratio:  0.82 Julien Nordmann MD Electronically signed by Julien Nordmann MD Signature Date/Time: 04/28/2023/6:08:18 PM    Final    MR BRAIN WO  CONTRAST  Result Date: 04/28/2023 CLINICAL DATA:  Neuro deficit,  acute, stroke suspected EXAM: MRI HEAD WITHOUT CONTRAST TECHNIQUE: Multiplanar, multiecho pulse sequences of the brain and surrounding structures were obtained without intravenous contrast. COMPARISON:  MRI head 10/25/2018. FINDINGS: Brain: No acute infarction, hemorrhage, hydrocephalus, extra-axial collection or mass lesion. Moderate T2/FLAIR hyperintensities the white matter, nonspecific but compatible with chronic microvascular ischemic change. Vascular: Major arterial flow voids are maintained at the skull base. Skull and upper cervical spine: Normal marrow signal. Sinuses/Orbits: Right maxillary sinus retention cyst. Otherwise, clear sinuses. No acute orbital findings. Other: No mastoid effusions. IMPRESSION: No evidence of acute intracranial abnormality. Electronically Signed   By: Feliberto Harts M.D.   On: 04/28/2023 08:09   CT ANGIO HEAD NECK W WO CM  Result Date: 04/28/2023 CLINICAL DATA:  Weakness in the left arm and leg. EXAM: CT ANGIOGRAPHY HEAD AND NECK WITH AND WITHOUT CONTRAST TECHNIQUE: Multidetector CT imaging of the head and neck was performed using the standard protocol during bolus administration of intravenous contrast. Multiplanar CT image reconstructions and MIPs were obtained to evaluate the vascular anatomy. Carotid stenosis measurements (when applicable) are obtained utilizing NASCET criteria, using the distal internal carotid diameter as the denominator. RADIATION DOSE REDUCTION: This exam was performed according to the departmental dose-optimization program which includes automated exposure control, adjustment of the mA and/or kV according to patient size and/or use of iterative reconstruction technique. CONTRAST:  75mL OMNIPAQUE IOHEXOL 350 MG/ML SOLN COMPARISON:  Brain MRI 10/25/2018 FINDINGS: CT HEAD FINDINGS Brain: No evidence of acute infarction, hemorrhage, hydrocephalus, extra-axial collection or mass  lesion/mass effect. Moderate low-density in the cerebral white matter attributed to chronic small vessel ischemia. Mild for age cerebral volume loss. Vascular: See below Skull: Negative Sinuses/Orbits: No acute finding. Review of the MIP images confirms the above findings CTA NECK FINDINGS Aortic arch: Atheromatous plaque. Three vessel branching. No acute finding Right carotid system: Mixed density plaque at the bifurcation without stenosis or ulceration. Left carotid system: Mixed density plaque at the bifurcation primarily. No stenosis or ulceration Vertebral arteries: Proximal subclavian atherosclerosis without significant stenosis. The left vertebral artery is dominant. No vertebral stenosis or beading Skeleton: Ordinary cervical spine degeneration. Other neck: No acute or aggressive finding. Upper chest: Clear apical lungs Review of the MIP images confirms the above findings CTA HEAD FINDINGS Anterior circulation: Atheromatous plaque along the carotid siphons. No branch occlusion, beading, proximal flow limiting stenosis or aneurysm. Posterior circulation: The vertebral and basilar arteries are smoothly contoured and widely patent. High-grade left P2/3 segment stenosis. No branch occlusion, beading, or aneurysm Venous sinuses: Unremarkable for arterial timing. Anatomic variants: None significant Review of the MIP images confirms the above findings IMPRESSION: 1. No emergent vascular finding. 2. Overall mild atherosclerosis except for an advanced left PCA stenosis. 3. Chronic small vessel ischemia in the cerebral white matter. Electronically Signed   By: Tiburcio Pea M.D.   On: 04/28/2023 07:44   DG Chest Port 1 View  Result Date: 04/28/2023 CLINICAL DATA:  Weakness. EXAM: PORTABLE CHEST 1 VIEW COMPARISON:  09/17/2021 FINDINGS: Stable cardiomediastinal contours. Aortic atherosclerotic calcifications. Decreased lung volumes. Scarring is again seen in the left lung base. No pleural effusion, interstitial  edema or consolidation. IMPRESSION: Low lung volumes. No acute findings. Electronically Signed   By: Signa Kell M.D.   On: 04/28/2023 06:27    Microbiology: Results for orders placed or performed during the hospital encounter of 09/17/21  Resp Panel by RT-PCR (Flu A&B, Covid) Nasopharyngeal Swab     Status: Abnormal   Collection Time: 09/17/21 12:41  PM   Specimen: Nasopharyngeal Swab; Nasopharyngeal(NP) swabs in vial transport medium  Result Value Ref Range Status   SARS Coronavirus 2 by RT PCR POSITIVE (A) NEGATIVE Final    Comment: (NOTE) SARS-CoV-2 target nucleic acids are DETECTED.  The SARS-CoV-2 RNA is generally detectable in upper respiratory specimens during the acute phase of infection. Positive results are indicative of the presence of the identified virus, but do not rule out bacterial infection or co-infection with other pathogens not detected by the test. Clinical correlation with patient history and other diagnostic information is necessary to determine patient infection status. The expected result is Negative.  Fact Sheet for Patients: BloggerCourse.com  Fact Sheet for Healthcare Providers: SeriousBroker.it  This test is not yet approved or cleared by the [place] FDA and  has been authorized for detection and/or diagnosis of SARS-CoV-2 by FDA under an Emergency Use Authorization (EUA).  This EUA will remain in effect (meaning this test can be used) for the duration of  the COVID-19 declaration under Section 564(b)(1) of the A ct, 21 U.S.C. section 360bbb-3(b)(1), unless the authorization is terminated or revoked sooner.     Influenza A by PCR NEGATIVE NEGATIVE Final   Influenza B by PCR NEGATIVE NEGATIVE Final    Comment: (NOTE) The Xpert Xpress SARS-CoV-2/FLU/RSV plus assay is intended as an aid in the diagnosis of influenza from Nasopharyngeal swab specimens and should not be used as a sole basis for  treatment. Nasal washings and aspirates are unacceptable for Xpert Xpress SARS-CoV-2/FLU/RSV testing.  Fact Sheet for Patients: BloggerCourse.com  Fact Sheet for Healthcare Providers: SeriousBroker.it  This test is not yet approved or cleared by the [place] FDA and has been authorized for detection and/or diagnosis of SARS-CoV-2 by FDA under an Emergency Use Authorization (EUA). This EUA will remain in effect (meaning this test can be used) for the duration of the COVID-19 declaration under Section 564(b)(1) of the Act, 21 U.S.C. section 360bbb-3(b)(1), unless the authorization is terminated or revoked.  Performed at [facility], [address], [place], [place] [address]     Labs: CBC: Recent Labs  Lab 04/28/23 0612  WBC 5.6  NEUTROABS 2.9  HGB 14.0  HCT 42.1  MCV 95.7  PLT 218   Basic Metabolic Panel: Recent Labs  Lab 04/28/23 0612 04/28/23 1317 04/29/23 0843  NA 141  --  141  K 3.2*  --  3.6  CL 103  --  105  CO2 28  --  26  GLUCOSE 148*  --  122*  BUN 24*  --  27*  CREATININE 1.11  --  0.96  CALCIUM 9.5  --  9.6  MG  --  2.2  --    Liver Function Tests: Recent Labs  Lab 04/28/23 0612  AST 21  ALT 24  ALKPHOS 66  BILITOT 0.4  PROT 7.4  ALBUMIN 3.8   CBG: Recent Labs  Lab 04/28/23 2015  GLUCAP 132*    Discharge time spent: greater than 30 minutes.  Signed: Baldwin Jamaica, MD [facility] 05/01/2023

## 2023-06-15 ENCOUNTER — Encounter: Payer: Self-pay | Admitting: Urology

## 2023-06-15 ENCOUNTER — Ambulatory Visit: Payer: Medicare PPO | Admitting: Urology

## 2023-06-15 VITALS — BP 132/61 | HR 51 | Ht 70.0 in | Wt 225.0 lb

## 2023-06-15 DIAGNOSIS — N476 Balanoposthitis: Secondary | ICD-10-CM

## 2023-06-15 MED ORDER — NYSTATIN-TRIAMCINOLONE 100000-0.1 UNIT/GM-% EX CREA: 1.0000 | TOPICAL_CREAM | Freq: Two times a day (BID) | CUTANEOUS | 0 refills | Status: AC

## 2023-06-15 NOTE — Progress Notes (Signed)
I, [name], acting as a Neurosurgeon for Brandon Altes, MD., have documented all relevant documentation on the behalf of Brandon Altes, MD, as directed by  Brandon Altes, MD while in the presence of Brandon Altes, MD.  06/15/2023 4:40 PM   Brandon Bright [birth year] [id number]  Referring provider: Wilford Corner, PA-C [address] [place],  [place] [address]  Chief Complaint  Patient presents with   Penis Pain    HPI: Brandon Bright is a 77 y.o. male who called for an acute visit for penile irritation.  1 week history of redness and irritation of the glans penis. Several years ago, was treated for a fungal infection and states current symptoms are similar. Previously seen for BPH with lower urinary tract symptoms. No neck split, no difficulty retracting the foreskin.   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   no issue/ DR Gwen Pounds   CATARACT EXTRACTION W/PHACO Left 02/04/2016   Procedure: CATARACT EXTRACTION PHACO AND INTRAOCULAR LENS PLACEMENT (IOC) LEFT EYE;  Surgeon: Lockie Mola, MD;  Location: [facility];  Service: Ophthalmology;  Laterality: Left;   CATARACT EXTRACTION W/PHACO Right 03/03/2016   Procedure: CATARACT EXTRACTION PHACO AND INTRAOCULAR LENS PLACEMENT (IOC);   Surgeon: Lockie Mola, MD;  Location: [facility];  Service: Ophthalmology;  Laterality: Right;   COLONOSCOPY     COLONOSCOPY WITH PROPOFOL N/A 06/05/2018   Procedure: COLONOSCOPY WITH PROPOFOL;  Surgeon: Scot Jun, MD;  Location: [facility] END[facility]COPY;  Service: Endoscopy;  Laterality: N/A;   COLONOSCOPY WITH PROPOFOL N/A 03/21/2023   Procedure: COLONOSCOPY WITH PROPOFOL;  Surgeon: Jaynie Collins, DO;  Location: [facility] ENDOSCOPY;  Service: Gastroenterology;  Laterality: N/A;   EYE SURGERY  May and June   bilateral cataract   ganglion cyst removal Right    PILONIDAL CYST EXCISION     POLYPECTOMY  03/21/2023   Procedure: POLYPECTOMY;  Surgeon: Jaynie Collins, DO;  Location: [facility] END[facility]COPY;  Service: Gastroenterology;;    Home Medications:  Allergies as of 06/15/2023       Reactions   Codeine Swelling, Rash   throat   Vicodin [hydrocodone-acetaminophen] Swelling   throat   Celebrex [celecoxib]    Abdominal pain   Ultram [tramadol]         Medication List        Accurate as of June 15, 2023  4:40 PM. If you have any questions, ask your nurse or doctor.          amLODipine 10 MG tablet Commonly known as: NORVASC Take 1 tablet by mouth daily.   ASPIRIN 81 PO Take 81 mg by mouth daily.   atorvastatin 80 MG tablet Commonly known as: LIPITOR  Take 1 tablet (80 mg total) by mouth daily.   cholecalciferol 25 MCG (1000 UNIT) tablet Commonly known as: VITAMIN D3 Take 1,000 Units by mouth daily.   clopidogrel 75 MG tablet Commonly known as: PLAVIX Take by mouth.   fluticasone 50 MCG/ACT nasal spray Commonly known as: FLONASE Place 2 sprays into both nostrils daily.   gabapentin 300 MG capsule Commonly known as: NEURONTIN Limit 2-3 tablets by mouth per day or twice per day if tolerated What changed: when to take this   ipratropium 0.06 % nasal spray Commonly known as: ATROVENT Place 2 sprays into both nostrils 4 (four) times daily.    latanoprost 0.005 % ophthalmic solution Commonly known as: XALATAN Place 1 drop into both eyes at bedtime.   Melatonin 5 MG Chew Chew 5 mg by mouth at bedtime as needed.   nystatin-triamcinolone cream Commonly known as: MYCOLOG II Apply 1 Application topically 2 (two) times daily for 7 days. Started by: Brandon Bright   potassium chloride 10 MEQ tablet Commonly known as: KLOR-CON M Take 1 tablet (10 mEq total) by mouth daily for 7 days.   PRESERVISION AREDS PO Take 1 tablet by mouth 2 (two) times daily. Am and dinner   Centrum Silver 50+Men Tabs Take 1 tablet by mouth daily.   SINUS RINSE KIT NA Place into the nose as needed.   valsartan-hydrochlorothiazide 160-12.5 MG tablet Commonly known as: DIOVAN-HCT        Allergies:  Allergies  Allergen Reactions   Codeine Swelling and Rash    throat   Vicodin [Hydrocodone-Acetaminophen] Swelling    throat   Celebrex [Celecoxib]     Abdominal pain   Ultram [Tramadol]     Family History: Family History  Problem Relation Age of Onset   Heart disease Father    Dementia Mother    Heart disease Brother    Heart disease Brother    Diabetes Brother     Social History:  reports that he has never smoked. He has never used smokeless tobacco. He reports that he does not drink alcohol and does not use drugs.   Physical Exam: BP 132/61   Pulse (!) 51   Ht 5\' 10"  (1.778 m)   Wt 225 lb (102.1 kg)   BMI 32.28 kg/m   Constitutional:  Alert and oriented, No acute distress. HEENT:  AT, moist mucus membranes.  Trachea midline, no masses. Cardiovascular: No clubbing, cyanosis, or edema. Respiratory: Normal respiratory effort, no increased work of breathing. GI: Abdomen is soft, nontender, nondistended, no abdominal masses GU: Phallus uncircumcised. Foreskin easily retracts. Erythema of the proximal glans. Testes descended bilaterally without masses or tenderness. Skin: No rashes, bruises or suspicious lesions. Neurologic:  Grossly intact, no focal deficits, moving all 4 extremities. Psychiatric: Normal mood and affect.    Assessment & Plan:    1. Balanoposthitis Rx Mycolog ointment, applied twice daily x7 days. He inquired about a circumcision and was informed this would make recurrent infections much less likely. Dorsal slit was also discussed. He will call back if interested in surgical options.      [facility] [address], [address] [place], [place] [address] [phone]

## 2023-06-21 NOTE — Progress Notes (Unsigned)
PROVIDER NOTE: Information contained herein reflects review and annotations entered in association with encounter. Interpretation of such information and data should be left to medically-trained personnel. Information provided to patient can be located elsewhere in the medical record under "Patient Instructions". Document created using STT-dictation technology, any transcriptional errors that may result from process are unintentional.    Patient: Brandon Bright  Service Category: E/M  Provider: Oswaldo Done, MD  DOB: [birth year]  DOS: 06/22/2023  Referring Provider: Wilford Corner  MRN: [id number]  Specialty: Interventional Pain Management  PCP: Wilford Corner, PA-C  Type: Established Patient  Setting: Ambulatory outpatient    Location: Office  Delivery: Face-to-face     HPI  [name], a 77 y.o. year old male, is here today because of his No primary diagnosis found.. Brandon Bright primary complain today is No chief complaint on file.  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 Pain Assessment:  Severity of   is reported as a  /10. Location:    / . Onset:  . Quality:  . Timing:  . Modifying factor(s):  Marland Kitchen Vitals:  vitals were not taken for this visit.  BMI: Estimated body mass index is 32.28 kg/m as calculated from the following:   Height as of 06/15/23: 5\' 10"  (1.778 m).   Weight as of 06/15/23: 225 lb (102.1 kg). Last encounter: 06/23/2022. Last procedure: Visit date not found.  Reason for encounter:  *** . ***  Pharmacotherapy Assessment  Analgesic: No opioid analgesics prescribed by our practice.   Monitoring: [place] PMP: PDMP reviewed during this encounter.       Pharmacotherapy: No side-effects or adverse reactions reported. Compliance: No problems identified. Effectiveness: Clinically acceptable.  No notes on file  No results found for: "CBDTHCR" No results found for: "D8THCCBX" No results found for: "D9THCCBX"  UDS:  No results found for: "SUMMARY"    ROS  Constitutional: Denies any fever or chills Gastrointestinal: No reported hemesis, hematochezia, vomiting, or acute GI distress Musculoskeletal: Denies any acute onset joint swelling, redness, loss of ROM, or weakness Neurological: No reported episodes of acute onset apraxia, aphasia, dysarthria, agnosia, amnesia, paralysis, loss of coordination, or loss of consciousness  Medication Review  Aspirin, Centrum Silver 50+Men, Hypertonic Nasal Wash, Melatonin, Multiple Vitamins-Minerals, amLODipine, atorvastatin, cholecalciferol, clopidogrel, fluticasone, gabapentin, ipratropium, latanoprost, nystatin-triamcinolone, potassium chloride, and valsartan-hydrochlorothiazide  History Review  Allergy: Brandon Bright is allergic to codeine, vicodin [hydrocodone-acetaminophen], celebrex [celecoxib], and ultram [tramadol]. Drug: Brandon Bright  reports no history of drug use. Alcohol:  reports no history of alcohol use. Tobacco:  reports that he has never smoked. He has never used smokeless tobacco. Social: Brandon Bright  reports that he  has never smoked. He has never used smokeless tobacco. He reports that he does not  drink alcohol and does not use drugs. 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 Surgical: Brandon Bright  has a past surgical history that includes ganglion cyst removal (Right); Cataract extraction w/PHACO (Left, 02/04/2016); Colonoscopy; Pilonidal cyst excision; Cardiac catheterization (1999?); Cataract extraction w/PHACO (Right, 03/03/2016); Eye surgery (May and June); Colonoscopy with propofol (N/A, 06/05/2018); Colonoscopy with propofol (N/A, 03/21/2023); biopsy (03/21/2023); and polypectomy (03/21/2023). Family: family history includes Dementia in his mother; Diabetes in his brother; Heart disease in his brother, brother, and father.  Laboratory Chemistry Profile   Renal Lab Results  Component Value Date   BUN 27 (H) 04/29/2023   CREATININE 0.96 04/29/2023   GFRAA >60 08/17/2014   GFRNONAA >60 04/29/2023    Hepatic Lab Results  Component Value Date   AST 21 04/28/2023   ALT 24 04/28/2023   ALBUMIN 3.8 04/28/2023   ALKPHOS 66 04/28/2023   LIPASE 75 08/16/2014    Electrolytes Lab Results  Component Value Date   NA 141 04/29/2023   K 3.6 04/29/2023   CL 105 04/29/2023   CALCIUM 9.6 04/29/2023   MG 2.2 04/28/2023    Bone No results found for: "VD25OH", "VD125OH2TOT", "FA2130QM5", "HQ4696EX5", "25OHVITD1", "25OHVITD2", "25OHVITD3", "TESTOFREE", "TESTOSTERONE"  Inflammation (CRP: Acute Phase) (ESR: Chronic Phase) Lab Results  Component Value Date   CRP 2 08/28/2020   ESRSEDRATE 26 08/28/2020         Note: Above Lab results reviewed.  Recent Imaging Review  ECHOCARDIOGRAM COMPLETE     ECHOCARDIOGRAM REPORT       Patient Name:   Brandon Bright Date of Exam: 04/28/2023 Medical Rec #:  [id number]       Height:       70.0 in Accession #:    [id number]      Weight:       242.0 lb Date of Birth:  Nov 11, 1945        BSA:          2.263 m Patient Age:    77 years        BP:           126/76 mmHg Patient Gender: M               HR:           77 bpm. Exam Location:  [facility]  Procedure: 2D Echo, Cardiac Doppler and Color Doppler  Indications:     TIA   History:         Patient has no prior history of Echocardiogram examinations.                  TIA, Arrythmias:RBBB; Risk Factors:Hypertension, Diabetes and                  Dyslipidemia.   Sonographer:     Mikki Harbor Referring Phys:  [phone] Francoise Schaumann [place] Diagnosing Phys: Julien Nordmann MD  IMPRESSIONS   1. Left ventricular ejection fraction, by estimation, is 60 to 65%. The left ventricle has normal function. The left ventricle has no regional wall motion abnormalities. Left ventricular diastolic parameters are consistent with Grade I diastolic  dysfunction (impaired relaxation).  2. Right ventricular systolic function is normal. The right ventricular size is normal. There is mildly elevated pulmonary artery systolic pressure. The estimated right ventricular systolic  pressure is 37.1 mmHg.  3. The mitral valve is normal in structure. Mild mitral valve regurgitation. No evidence of mitral stenosis.  4. The aortic valve is tricuspid. Aortic valve regurgitation is not visualized. Aortic valve sclerosis/calcification is present, without any evidence of aortic stenosis. Aortic valve mean gradient measures 5.0 mmHg.  5. There is mild dilatation of the aortic root, measuring 40 mm. There is mild dilatation of the ascending aorta, measuring 40 mm.  6. The inferior vena cava is normal in size with greater than 50% respiratory variability, suggesting right atrial pressure of 3 mmHg.  FINDINGS  Left Ventricle: Left ventricular ejection  fraction, by estimation, is 60 to 65%. The left ventricle has normal function. The left ventricle has no regional wall motion abnormalities. The left ventricular internal cavity size was normal in size. There is  no left ventricular hypertrophy. Left ventricular diastolic parameters are consistent with Grade I diastolic dysfunction (impaired relaxation).  Right Ventricle: The right ventricular size is normal. No increase in right ventricular wall thickness. Right ventricular systolic function is normal. There is mildly elevated pulmonary artery systolic pressure. The tricuspid regurgitant velocity is 2.92  m/s, and with an assumed right atrial pressure of 3 mmHg, the estimated right ventricular systolic pressure is 37.1 mmHg.  Left Atrium: Left atrial size was normal in size.  Right Atrium: Right atrial size was normal in size.  Pericardium: There is no evidence of pericardial effusion.  Mitral Valve: The mitral valve is normal in structure. Mild mitral annular calcification. Mild mitral valve regurgitation. No evidence of mitral valve stenosis. MV peak gradient, 5.7 mmHg. The mean mitral valve gradient is 3.0 mmHg.  Tricuspid Valve: The tricuspid valve is normal in structure. Tricuspid valve regurgitation is mild . No evidence of tricuspid stenosis.  Aortic Valve: The aortic valve is tricuspid. Aortic valve regurgitation is not visualized. Aortic valve sclerosis/calcification is present, without any evidence of aortic stenosis. Aortic valve mean gradient measures 5.0 mmHg. Aortic valve peak gradient  measures 9.7 mmHg. Aortic valve area, by VTI measures 1.90 cm.  Pulmonic Valve: The pulmonic valve was normal in structure. Pulmonic valve regurgitation is mild. No evidence of pulmonic stenosis.  Aorta: The aortic root is normal in size and structure. There is mild dilatation of the aortic root, measuring 40 mm. There is mild dilatation of the ascending aorta, measuring 40 mm.  Venous: The  inferior vena cava is normal in size with greater than 50% respiratory variability, suggesting right atrial pressure of 3 mmHg.  IAS/Shunts: No atrial level shunt detected by color flow Doppler.    LEFT VENTRICLE PLAX 2D LVIDd:         4.20 cm   Diastology LVIDs:         2.80 cm   LV e' medial:    6.09 cm/s LV PW:         1.20 cm   LV E/e' medial:  14.0 LV IVS:        1.10 cm   LV e' lateral:   8.05 cm/s LVOT diam:     2.00 cm   LV E/e' lateral: 10.6 LV SV:         72 LV SV Index:   32 LVOT Area:     3.14 cm    RIGHT VENTRICLE RV Basal diam:  3.60 cm RV Mid diam:    3.60 cm RV S prime:     12.90 cm/s TAPSE (M-mode): 2.3 cm  LEFT ATRIUM  Index        RIGHT ATRIUM           Index LA diam:        4.50 cm 1.99 cm/m   RA Area:     16.70 cm LA Vol (A2C):   49.7 ml 21.96 ml/m  RA Volume:   37.00 ml  16.35 ml/m LA Vol (A4C):   45.2 ml 19.97 ml/m LA Biplane Vol: 49.3 ml 21.78 ml/m  AORTIC VALVE                     PULMONIC VALVE AV Area (Vmax):    2.24 cm      PV Vmax:       1.12 m/s AV Area (Vmean):   2.04 cm      PV Peak grad:  5.0 mmHg AV Area (VTI):     1.90 cm AV Vmax:           156.00 cm/s AV Vmean:          111.000 cm/s AV VTI:            0.377 m AV Peak Grad:      9.7 mmHg AV Mean Grad:      5.0 mmHg LVOT Vmax:         111.00 cm/s LVOT Vmean:        72.100 cm/s LVOT VTI:          0.228 m LVOT/AV VTI ratio: 0.60   AORTA Ao Root diam: 4.00 cm Ao Asc diam:  4.00 cm  MITRAL VALVE                TRICUSPID VALVE MV Area (PHT): 3.00 cm     TR Peak grad:   34.1 mmHg MV Area VTI:   2.18 cm     TR Vmax:        292.00 cm/s MV Peak grad:  5.7 mmHg MV Mean grad:  3.0 mmHg     SHUNTS MV Vmax:       1.19 m/s     Systemic VTI:  0.23 m MV Vmean:      77.0 cm/s    Systemic Diam: 2.00 cm MV Decel Time: 253 msec MV E velocity: 85.40 cm/s MV A velocity: 104.00 cm/s MV E/A ratio:  0.82  Julien Nordmann MD Electronically signed by Julien Nordmann MD Signature  Date/Time: 04/28/2023/6:08:18 PM      Final   MR BRAIN WO CONTRAST CLINICAL DATA:  Neuro deficit, acute, stroke suspected  EXAM: MRI HEAD WITHOUT CONTRAST  TECHNIQUE: Multiplanar, multiecho pulse sequences of the brain and surrounding structures were obtained without intravenous contrast.  COMPARISON:  MRI head 10/25/2018.  FINDINGS: Brain: No acute infarction, hemorrhage, hydrocephalus, extra-axial collection or mass lesion. Moderate T2/FLAIR hyperintensities the white matter, nonspecific but compatible with chronic microvascular ischemic change.  Vascular: Major arterial flow voids are maintained at the skull base.  Skull and upper cervical spine: Normal marrow signal.  Sinuses/Orbits: Right maxillary sinus retention cyst. Otherwise, clear sinuses. No acute orbital findings.  Other: No mastoid effusions.  IMPRESSION: No evidence of acute intracranial abnormality.  Electronically Signed   By: Feliberto Harts M.D.   On: 04/28/2023 08:09 CT ANGIO HEAD NECK W WO CM CLINICAL DATA:  Weakness in the left arm and leg.  EXAM: CT ANGIOGRAPHY HEAD AND NECK WITH AND WITHOUT CONTRAST  TECHNIQUE: Multidetector CT imaging of the head and neck was performed using the standard protocol during bolus  administration of intravenous contrast. Multiplanar CT image reconstructions and MIPs were obtained to evaluate the vascular anatomy. Carotid stenosis measurements (when applicable) are obtained utilizing NASCET criteria, using the distal internal carotid diameter as the denominator.  RADIATION DOSE REDUCTION: This exam was performed according to the departmental dose-optimization program which includes automated exposure control, adjustment of the mA and/or kV according to patient size and/or use of iterative reconstruction technique.  CONTRAST:  75mL OMNIPAQUE IOHEXOL 350 MG/ML SOLN  COMPARISON:  Brain MRI 10/25/2018  FINDINGS: CT HEAD FINDINGS  Brain: No evidence of  acute infarction, hemorrhage, hydrocephalus, extra-axial collection or mass lesion/mass effect. Moderate low-density in the cerebral white matter attributed to chronic small vessel ischemia. Mild for age cerebral volume loss.  Vascular: See below  Skull: Negative  Sinuses/Orbits: No acute finding.  Review of the MIP images confirms the above findings  CTA NECK FINDINGS  Aortic arch: Atheromatous plaque. Three vessel branching. No acute finding  Right carotid system: Mixed density plaque at the bifurcation without stenosis or ulceration.  Left carotid system: Mixed density plaque at the bifurcation primarily. No stenosis or ulceration  Vertebral arteries: Proximal subclavian atherosclerosis without significant stenosis. The left vertebral artery is dominant. No vertebral stenosis or beading  Skeleton: Ordinary cervical spine degeneration.  Other neck: No acute or aggressive finding.  Upper chest: Clear apical lungs  Review of the MIP images confirms the above findings  CTA HEAD FINDINGS  Anterior circulation: Atheromatous plaque along the carotid siphons. No branch occlusion, beading, proximal flow limiting stenosis or aneurysm.  Posterior circulation: The vertebral and basilar arteries are smoothly contoured and widely patent. High-grade left P2/3 segment stenosis. No branch occlusion, beading, or aneurysm  Venous sinuses: Unremarkable for arterial timing.  Anatomic variants: None significant  Review of the MIP images confirms the above findings  IMPRESSION: 1. No emergent vascular finding. 2. Overall mild atherosclerosis except for an advanced left PCA stenosis. 3. Chronic small vessel ischemia in the cerebral white matter.  Electronically Signed   By: Tiburcio Pea M.D.   On: 04/28/2023 07:44 DG Chest Port 1 View CLINICAL DATA:  Weakness.  EXAM: PORTABLE CHEST 1 VIEW  COMPARISON:  09/17/2021  FINDINGS: Stable cardiomediastinal contours. Aortic  atherosclerotic calcifications. Decreased lung volumes. Scarring is again seen in the left lung base. No pleural effusion, interstitial edema or consolidation.  IMPRESSION: Low lung volumes. No acute findings.  Electronically Signed   By: Signa Kell M.D.   On: 04/28/2023 06:27 Note: Reviewed        Physical Exam  General appearance: Well nourished, well developed, and well hydrated. In no apparent acute distress Mental status: Alert, oriented x 3 (person, place, & time)       Respiratory: No evidence of acute respiratory distress Eyes: PERLA Vitals: There were no vitals taken for this visit. BMI: Estimated body mass index is 32.28 kg/m as calculated from the following:   Height as of 06/15/23: 5\' 10"  (1.778 m).   Weight as of 06/15/23: 225 lb (102.1 kg). Ideal: Ideal body weight: 73 kg (160 lb 15 oz) Adjusted ideal body weight: 84.6 kg (186 lb 9 oz)  Assessment   Diagnosis Status  No diagnosis found. Controlled Controlled Controlled   Updated Problems: No problems updated.  Plan of Care  Problem-specific:  No problem-specific Assessment & Plan notes found for this encounter.  Brandon Bright has a current medication list which includes the following long-term medication(s): amlodipine, atorvastatin, fluticasone, gabapentin, hypertonic nasal wash, ipratropium, potassium  chloride, and valsartan-hydrochlorothiazide.  Pharmacotherapy (Medications Ordered): No orders of the defined types were placed in this encounter.  Orders:  No orders of the defined types were placed in this encounter.  Follow-up plan:   No follow-ups on file.      Interventional Therapies  Risk  Complexity Considerations:   Estimated body mass index is 31.57 kg/m as calculated from the following:   Height as of this encounter: 5\' 10"  (1.778 m).   Weight as of this encounter: 220 lb (99.8 kg). WNL   Planned  Pending:      Under consideration:   Diagnostic right L3 TFESI   Diagnostic bilateral lumbar facet MBB  Diagnostic Trigger point injection  Diagnostic  bilateral cervical facet MBB     Completed:   Diagnostic/therapeutic right L4 TFESI x1 (04/01/2022) (100/100/100/100)  Diagnostic/therapeutic left IA hip joint injection x1 (09/09/2020) (100/100/100/100)    Therapeutic  Palliative (PRN) options:  Palliative/therapeutic left IA hip joint injection #2  Palliative right L4-5 LESI (none done by me) The patient indicated having had LESI x 4, by Dr. Metta Clines. (before 2000) (Procedure described as helpful).           Recent Visits No visits were found meeting these conditions. Showing recent visits within past 90 days and meeting all other requirements Future Appointments Date Type Provider Dept  06/22/23 Appointment Delano Metz, MD [facility]  Showing future appointments within next 90 days and meeting all other requirements  I discussed the assessment and treatment plan with the patient. The patient was provided an opportunity to ask questions and all were answered. The patient agreed with the plan and demonstrated an understanding of the instructions.  Patient advised to call back or seek an in-person evaluation if the symptoms or condition worsens.  Duration of encounter: *** minutes.  Total time on encounter, as per AMA guidelines included both the face-to-face and non-face-to-face time personally spent by the physician and/or other qualified health care professional(s) on the day of the encounter (includes time in activities that require the physician or other qualified health care professional and does not include time in activities normally performed by clinical staff). Physician's time may include the following activities when performed: Preparing to see the patient (e.g., pre-charting review of records, searching for previously ordered imaging, lab work, and nerve conduction tests) Review of prior analgesic  pharmacotherapies. Reviewing PMP Interpreting ordered tests (e.g., lab work, imaging, nerve conduction tests) Performing post-procedure evaluations, including interpretation of diagnostic procedures Obtaining and/or reviewing separately obtained history Performing a medically appropriate examination and/or evaluation Counseling and educating the patient/family/caregiver Ordering medications, tests, or procedures Referring and communicating with other health care professionals (when not separately reported) Documenting clinical information in the electronic or other health record Independently interpreting results (not separately reported) and communicating results to the patient/ family/caregiver Care coordination (not separately reported)  Note by: Oswaldo Done, MD Date: 06/22/2023; Time: 7:48 PM

## 2023-06-22 ENCOUNTER — Ambulatory Visit: Payer: Medicare PPO | Attending: Pain Medicine | Admitting: Pain Medicine

## 2023-06-22 ENCOUNTER — Encounter: Payer: Self-pay | Admitting: Pain Medicine

## 2023-06-22 VITALS — BP 126/63 | HR 61 | Temp 98.1°F | Resp 18 | Ht 70.0 in | Wt 225.0 lb

## 2023-06-22 DIAGNOSIS — M542 Cervicalgia: Secondary | ICD-10-CM | POA: Insufficient documentation

## 2023-06-22 DIAGNOSIS — Z0000 Encounter for general adult medical examination without abnormal findings: Secondary | ICD-10-CM | POA: Insufficient documentation

## 2023-06-22 DIAGNOSIS — Z7901 Long term (current) use of anticoagulants: Secondary | ICD-10-CM | POA: Diagnosis not present

## 2023-06-22 DIAGNOSIS — M79604 Pain in right leg: Secondary | ICD-10-CM | POA: Insufficient documentation

## 2023-06-22 DIAGNOSIS — G8929 Other chronic pain: Secondary | ICD-10-CM | POA: Insufficient documentation

## 2023-06-22 DIAGNOSIS — M5441 Lumbago with sciatica, right side: Secondary | ICD-10-CM | POA: Diagnosis not present

## 2023-06-22 NOTE — Progress Notes (Signed)
Safety precautions to be maintained throughout the outpatient stay will include: orient to surroundings, keep bed in low position, maintain call bell within reach at all times, provide assistance with transfer out of bed and ambulation.  

## 2023-11-22 NOTE — Progress Notes (Unsigned)
 PROVIDER NOTE: Information contained herein reflects review and annotations entered in association with encounter. Interpretation of such information and data should be left to medically-trained personnel. Information provided to patient can be located elsewhere in the medical record under "Patient Instructions". Document created using STT-dictation technology, any transcriptional errors that may result from process are unintentional.    Patient: Brandon Bright  Service Category: E/M  Provider: Oswaldo Done, MD  DOB: [birth year]  DOS: 11/23/2023  Referring Provider: Wilford Corner  MRN: [id number]  Specialty: Interventional Pain Management  PCP: Wilford Corner, PA-C  Type: Established Patient  Setting: Ambulatory outpatient    Location: Office  Delivery: Face-to-face     HPI  Brandon Bright, a 78 y.o. year old male, is here today because of his Chronic pain of right lower extremity [M79.604, G89.29]. Brandon Bright primary complain today is No chief complaint on file.  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 Pain Assessment: Severity of   is reported as a  /10. Location:    / . Onset:  . Quality:  . Timing:  . Modifying factor(s):  Marland Kitchen Vitals:  vitals were not taken for this visit.  BMI: Estimated body mass index is 32.28 kg/m as calculated from the following:   Height as of 06/22/23: 5\' 10"  (1.778 m).   Weight as of 06/22/23: 225 lb (102.1 kg). Last encounter: 06/22/2023. Last procedure: Visit date not found.  Reason for encounter: evaluation of worsening, or previously known (established) problem. ***  Discussed the use of AI scribe software for clinical note transcription with the patient, who gave verbal consent to proceed.  History of Present Illness           Pharmacotherapy Assessment  Analgesic: No opioid analgesics prescribed by our practice.   Monitoring: [place] PMP: PDMP reviewed during this encounter.       Pharmacotherapy: No side-effects or adverse reactions reported. Compliance: No problems identified. Effectiveness: Clinically acceptable.  No notes on file  No results found for: "CBDTHCR" No results found for: "D8THCCBX" No results found for: "D9THCCBX"  UDS:  No results found for: "SUMMARY"    ROS  Constitutional: Denies any fever or chills Gastrointestinal: No reported hemesis, hematochezia, vomiting, or acute GI distress Musculoskeletal: Denies any acute onset joint swelling, redness, loss of ROM, or weakness Neurological: No reported episodes of acute onset apraxia, aphasia, dysarthria, agnosia, amnesia, paralysis, loss of coordination, or loss of consciousness  Medication Review  Centrum Silver 50+Men, Hypertonic Nasal Wash, Multiple Vitamins-Minerals, amLODipine, atorvastatin, cholecalciferol, clopidogrel, gabapentin, ipratropium, latanoprost, potassium chloride, and valsartan-hydrochlorothiazide  History Review  Allergy: Brandon Bright is allergic to codeine, vicodin [hydrocodone-acetaminophen], celebrex [celecoxib], and ultram  [tramadol]. Drug: Brandon Bright  reports no history of drug use. Alcohol:  reports no history of alcohol use. Tobacco:  reports that he has never smoked. He has never used smokeless tobacco. Social: Brandon Bright  reports that he has never smoked. He has never used smokeless tobacco. He reports that he does not drink alcohol and does not use drugs. 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 Surgical: Brandon Bright  has a past surgical history that includes ganglion cyst removal (Right); Cataract extraction w/PHACO (Left, 02/04/2016); Colonoscopy; Pilonidal cyst excision; Cardiac catheterization (1999?); Cataract extraction w/PHACO (Right, 03/03/2016); Eye surgery (May and June); Colonoscopy with propofol (N/A, 06/05/2018); Colonoscopy with propofol (N/A, 03/21/2023); biopsy (03/21/2023); and polypectomy (03/21/2023). Family: family history includes Dementia in his mother; Diabetes in his brother; Heart disease in his brother, brother, and father.  Laboratory Chemistry Profile   Renal Lab Results  Component Value Date   BUN 27 (H) 04/29/2023   CREATININE 0.96 04/29/2023   GFRAA >60 08/17/2014   GFRNONAA >60 04/29/2023    Hepatic Lab Results  Component Value Date   AST 21 04/28/2023   ALT 24 04/28/2023   ALBUMIN 3.8 04/28/2023   ALKPHOS 66 04/28/2023   LIPASE 75 08/16/2014    Electrolytes Lab Results  Component Value Date   NA 141 04/29/2023   K 3.6 04/29/2023   CL 105 04/29/2023   CALCIUM 9.6 04/29/2023   MG 2.2 04/28/2023    Bone No results found for: "VD25OH", "VD125OH2TOT", "ZO1096EA5", "WU9811BJ4", "25OHVITD1", "25OHVITD2", "25OHVITD3", "TESTOFREE", "TESTOSTERONE"  Inflammation (CRP:  Acute Phase) (ESR: Chronic Phase) Lab Results  Component Value Date   CRP 2 08/28/2020   ESRSEDRATE 26 08/28/2020         Note: Above Lab results reviewed.  Recent Imaging Review  ECHOCARDIOGRAM COMPLETE    ECHOCARDIOGRAM REPORT       Patient Name:   ESTELLE SKIBICKI Lick Date of Exam: 04/28/2023 Medical Rec #:  [id number]       Height:       70.0 in Accession #:    [id number]      Weight:       242.0 lb Date of Birth:  April 25, 1946        BSA:          2.263 m Patient Age:    77 years        BP:           126/76 mmHg Patient Gender: M               HR:           77 bpm. Exam Location:  [facility]  Procedure: 2D Echo, Cardiac Doppler and Color Doppler  Indications:     TIA   History:         Patient has no prior history of Echocardiogram examinations.                  TIA, Arrythmias:RBBB; Risk Factors:Hypertension, Diabetes and                  Dyslipidemia.   Sonographer:     Mikki Harbor Referring Phys:  [phone] Francoise Schaumann [place] Diagnosing Phys: Julien Nordmann MD  IMPRESSIONS   1. Left ventricular ejection fraction, by estimation, is 60 to 65%. The left ventricle has normal function. The left ventricle has no regional wall motion  abnormalities. Left ventricular diastolic parameters are consistent with Grade I diastolic  dysfunction (impaired relaxation).  2. Right ventricular systolic function is normal. The right ventricular size is normal. There is mildly elevated pulmonary artery systolic pressure. The estimated right ventricular systolic pressure is 37.1 mmHg.  3. The mitral valve is normal in structure. Mild mitral valve regurgitation. No evidence of mitral stenosis.  4. The aortic valve is tricuspid. Aortic valve regurgitation is not visualized. Aortic valve sclerosis/calcification is present, without any evidence of aortic stenosis. Aortic valve mean gradient measures 5.0 mmHg.  5. There is mild dilatation of the aortic root, measuring 40 mm. There is mild dilatation of the  ascending aorta, measuring 40 mm.  6. The inferior vena cava is normal in size with greater than 50% respiratory variability, suggesting right atrial pressure of 3 mmHg.  FINDINGS  Left Ventricle: Left ventricular ejection fraction, by estimation, is 60 to 65%. The left ventricle has normal function. The left ventricle has no regional wall motion abnormalities. The left ventricular internal cavity size was normal in size. There is  no left ventricular hypertrophy. Left ventricular diastolic parameters are consistent with Grade I diastolic dysfunction (impaired relaxation).  Right Ventricle: The right ventricular size is normal. No increase in right ventricular wall thickness. Right ventricular systolic function is normal. There is mildly elevated pulmonary artery systolic pressure. The tricuspid regurgitant velocity is 2.92  m/s, and with an assumed right atrial pressure of 3 mmHg, the estimated right ventricular systolic pressure is 37.1 mmHg.  Left Atrium: Left atrial size was normal in size.  Right Atrium: Right atrial size was normal in size.  Pericardium: There is no evidence of pericardial effusion.  Mitral Valve: The mitral valve is normal in structure. Mild mitral annular calcification. Mild mitral valve regurgitation. No evidence of mitral valve stenosis. MV peak gradient, 5.7 mmHg. The mean mitral valve gradient is 3.0 mmHg.  Tricuspid Valve: The tricuspid valve is normal in structure. Tricuspid valve regurgitation is mild . No evidence of tricuspid stenosis.  Aortic Valve: The aortic valve is tricuspid. Aortic valve regurgitation is not visualized. Aortic valve sclerosis/calcification is present, without any evidence of aortic stenosis. Aortic valve mean gradient measures 5.0 mmHg. Aortic valve peak gradient  measures 9.7 mmHg. Aortic valve area, by VTI measures 1.90 cm.  Pulmonic Valve: The pulmonic valve was normal in structure. Pulmonic valve regurgitation is mild. No evidence  of pulmonic stenosis.  Aorta: The aortic root is normal in size and structure. There is mild dilatation of the aortic root, measuring 40 mm. There is mild dilatation of the ascending aorta, measuring 40 mm.  Venous: The inferior vena cava is normal in size with greater than 50% respiratory variability, suggesting right atrial pressure of 3 mmHg.  IAS/Shunts: No atrial level shunt detected by color flow Doppler.    LEFT VENTRICLE PLAX 2D LVIDd:         4.20 cm   Diastology LVIDs:         2.80 cm   LV e' medial:    6.09 cm/s LV PW:         1.20 cm   LV E/e' medial:  14.0 LV IVS:        1.10 cm   LV e' lateral:   8.05 cm/s LVOT diam:     2.00 cm   LV E/e' lateral: 10.6 LV SV:         72 LV SV Index:   32 LVOT Area:  3.14 cm    RIGHT VENTRICLE RV Basal diam:  3.60 cm RV Mid diam:    3.60 cm RV S prime:     12.90 cm/s TAPSE (M-mode): 2.3 cm  LEFT ATRIUM             Index        RIGHT ATRIUM           Index LA diam:        4.50 cm 1.99 cm/m   RA Area:     16.70 cm LA Vol (A2C):   49.7 ml 21.96 ml/m  RA Volume:   37.00 ml  16.35 ml/m LA Vol (A4C):   45.2 ml 19.97 ml/m LA Biplane Vol: 49.3 ml 21.78 ml/m  AORTIC VALVE                     PULMONIC VALVE AV Area (Vmax):    2.24 cm      PV Vmax:       1.12 m/s AV Area (Vmean):   2.04 cm      PV Peak grad:  5.0 mmHg AV Area (VTI):     1.90 cm AV Vmax:           156.00 cm/s AV Vmean:          111.000 cm/s AV VTI:            0.377 m AV Peak Grad:      9.7 mmHg AV Mean Grad:      5.0 mmHg LVOT Vmax:         111.00 cm/s LVOT Vmean:        72.100 cm/s LVOT VTI:          0.228 m LVOT/AV VTI ratio: 0.60   AORTA Ao Root diam: 4.00 cm Ao Asc diam:  4.00 cm  MITRAL VALVE                TRICUSPID VALVE MV Area (PHT): 3.00 cm     TR Peak grad:   34.1 mmHg MV Area VTI:   2.18 cm     TR Vmax:        292.00 cm/s MV Peak grad:  5.7 mmHg MV Mean grad:  3.0 mmHg     SHUNTS MV Vmax:       1.19 m/s     Systemic VTI:  0.23 m MV  Vmean:      77.0 cm/s    Systemic Diam: 2.00 cm MV Decel Time: 253 msec MV E velocity: 85.40 cm/s MV A velocity: 104.00 cm/s MV E/A ratio:  0.82  Julien Nordmann MD Electronically signed by Julien Nordmann MD Signature Date/Time: 04/28/2023/6:08:18 PM      Final   MR BRAIN WO CONTRAST CLINICAL DATA:  Neuro deficit, acute, stroke suspected  EXAM: MRI HEAD WITHOUT CONTRAST  TECHNIQUE: Multiplanar, multiecho pulse sequences of the brain and surrounding structures were obtained without intravenous contrast.  COMPARISON:  MRI head 10/25/2018.  FINDINGS: Brain: No acute infarction, hemorrhage, hydrocephalus, extra-axial collection or mass lesion. Moderate T2/FLAIR hyperintensities the white matter, nonspecific but compatible with chronic microvascular ischemic change.  Vascular: Major arterial flow voids are maintained at the skull base.  Skull and upper cervical spine: Normal marrow signal.  Sinuses/Orbits: Right maxillary sinus retention cyst. Otherwise, clear sinuses. No acute orbital findings.  Other: No mastoid effusions.  IMPRESSION: No evidence of acute intracranial abnormality.  Electronically Signed   By: Juluis Mire.D.  On: 04/28/2023 08:09 CT ANGIO HEAD NECK W WO CM CLINICAL DATA:  Weakness in the left arm and leg.  EXAM: CT ANGIOGRAPHY HEAD AND NECK WITH AND WITHOUT CONTRAST  TECHNIQUE: Multidetector CT imaging of the head and neck was performed using the standard protocol during bolus administration of intravenous contrast. Multiplanar CT image reconstructions and MIPs were obtained to evaluate the vascular anatomy. Carotid stenosis measurements (when applicable) are obtained utilizing NASCET criteria, using the distal internal carotid diameter as the denominator.  RADIATION DOSE REDUCTION: This exam was performed according to the departmental dose-optimization program which includes automated exposure control, adjustment of the mA and/or kV  according to patient size and/or use of iterative reconstruction technique.  CONTRAST:  75mL OMNIPAQUE IOHEXOL 350 MG/ML SOLN  COMPARISON:  Brain MRI 10/25/2018  FINDINGS: CT HEAD FINDINGS  Brain: No evidence of acute infarction, hemorrhage, hydrocephalus, extra-axial collection or mass lesion/mass effect. Moderate low-density in the cerebral white matter attributed to chronic small vessel ischemia. Mild for age cerebral volume loss.  Vascular: See below  Skull: Negative  Sinuses/Orbits: No acute finding.  Review of the MIP images confirms the above findings  CTA NECK FINDINGS  Aortic arch: Atheromatous plaque. Three vessel branching. No acute finding  Right carotid system: Mixed density plaque at the bifurcation without stenosis or ulceration.  Left carotid system: Mixed density plaque at the bifurcation primarily. No stenosis or ulceration  Vertebral arteries: Proximal subclavian atherosclerosis without significant stenosis. The left vertebral artery is dominant. No vertebral stenosis or beading  Skeleton: Ordinary cervical spine degeneration.  Other neck: No acute or aggressive finding.  Upper chest: Clear apical lungs  Review of the MIP images confirms the above findings  CTA HEAD FINDINGS  Anterior circulation: Atheromatous plaque along the carotid siphons. No branch occlusion, beading, proximal flow limiting stenosis or aneurysm.  Posterior circulation: The vertebral and basilar arteries are smoothly contoured and widely patent. High-grade left P2/3 segment stenosis. No branch occlusion, beading, or aneurysm  Venous sinuses: Unremarkable for arterial timing.  Anatomic variants: None significant  Review of the MIP images confirms the above findings  IMPRESSION: 1. No emergent vascular finding. 2. Overall mild atherosclerosis except for an advanced left PCA stenosis. 3. Chronic small vessel ischemia in the cerebral white matter.  Electronically  Signed   By: Tiburcio Pea M.D.   On: 04/28/2023 07:44 DG Chest Port 1 View CLINICAL DATA:  Weakness.  EXAM: PORTABLE CHEST 1 VIEW  COMPARISON:  09/17/2021  FINDINGS: Stable cardiomediastinal contours. Aortic atherosclerotic calcifications. Decreased lung volumes. Scarring is again seen in the left lung base. No pleural effusion, interstitial edema or consolidation.  IMPRESSION: Low lung volumes. No acute findings.  Electronically Signed   By: Signa Kell M.D.   On: 04/28/2023 06:27 Note: Reviewed        Physical Exam  General appearance: Well nourished, well developed, and well hydrated. In no apparent acute distress Mental status: Alert, oriented x 3 (person, place, & time)       Respiratory: No evidence of acute respiratory distress Eyes: PERLA Vitals: There were no vitals taken for this visit. BMI: Estimated body mass index is 32.28 kg/m as calculated from the following:   Height as of 06/22/23: 5\' 10"  (1.778 m).   Weight as of 06/22/23: 225 lb (102.1 kg). Ideal: Patient weight not recorded  Assessment   Diagnosis Status  1. Chronic lower extremity pain (1ry area of Pain) (Right)   2. Chronic midline low back pain (2ry area of  Pain) w/ sciatica (Right)   3. Chronic neck pain (3ry area of Pain) (Left)    Controlled Controlled Controlled   Updated Problems: No problems updated.  Plan of Care  Problem-specific:  Assessment and Plan            Mr. DARAY Bright has a current medication list which includes the following long-term medication(s): amlodipine, atorvastatin, gabapentin, hypertonic nasal wash, ipratropium, potassium chloride, and valsartan-hydrochlorothiazide.  Pharmacotherapy (Medications Ordered): No orders of the defined types were placed in this encounter.  Orders:  No orders of the defined types were placed in this encounter.  Follow-up plan:   No follow-ups on file.      Interventional Therapies  Risk Factors  Considerations   Medical Comorbidities:  Plavix Anticoagulation: (Stop: 7-10 days  Restart: 2 hours)  TIAs  HTN  T2NIDDM  (B) Carotid Stenosis  RBBB     Planned  Pending:      Under consideration:   Diagnostic right L3 TFESI  Diagnostic bilateral lumbar facet MBB  Diagnostic Trigger point injection  Diagnostic  bilateral cervical facet MBB     Completed:   Diagnostic/therapeutic right L4 TFESI x1 (04/01/2022) (100/100/100/100)  Diagnostic/therapeutic left IA hip joint injection x1 (09/09/2020) (100/100/100/100)    Therapeutic  Palliative (PRN) options:   Palliative/therapeutic left IA hip joint injection #2  Palliative right L4-5 LESI (none done by me)   Completed by other providers:   The patient indicated having had LESI x 4, by Dr. Metta Clines. (before 2000) (Procedure described as helpful).        Recent Visits No visits were found meeting these conditions. Showing recent visits within past 90 days and meeting all other requirements Future Appointments Date Type Provider Dept  11/23/23 Appointment Delano Metz, MD [facility]  Showing future appointments within next 90 days and meeting all other requirements  I discussed the assessment and treatment plan with the patient. The patient was provided an opportunity to ask questions and all were answered. The patient agreed with the plan and demonstrated an understanding of the instructions.  Patient advised to call back or seek an in-person evaluation if the symptoms or condition worsens.  Duration of encounter: *** minutes.  Total time on encounter, as per AMA guidelines included both the face-to-face and non-face-to-face time personally spent by the physician and/or other qualified health care professional(s) on the day of the encounter (includes time in activities that require the physician or other qualified health care professional and does not include time in activities normally performed by clinical staff). Physician's  time may include the following activities when performed: Preparing to see the patient (e.g., pre-charting review of records, searching for previously ordered imaging, lab work, and nerve conduction tests) Review of prior analgesic pharmacotherapies. Reviewing PMP Interpreting ordered tests (e.g., lab work, imaging, nerve conduction tests) Performing post-procedure evaluations, including interpretation of diagnostic procedures Obtaining and/or reviewing separately obtained history Performing a medically appropriate examination and/or evaluation Counseling and educating the patient/family/caregiver Ordering medications, tests, or procedures Referring and communicating with other health care professionals (when not separately reported) Documenting clinical information in the electronic or other health record Independently interpreting results (not separately reported) and communicating results to the patient/ family/caregiver Care coordination (not separately reported)  Note by: Oswaldo Done, MD Date: 11/23/2023; Time: 7:29 AM

## 2023-11-23 ENCOUNTER — Encounter: Payer: Self-pay | Admitting: Pain Medicine

## 2023-11-23 ENCOUNTER — Ambulatory Visit: Payer: Medicare PPO | Admitting: Pain Medicine

## 2023-11-23 ENCOUNTER — Ambulatory Visit
Admission: RE | Admit: 2023-11-23 | Discharge: 2023-11-23 | Disposition: A | Discharge status: Home / Self Care | Payer: Medicare PPO | Source: Ambulatory Visit | Attending: Pain Medicine | Admitting: Pain Medicine

## 2023-11-23 ENCOUNTER — Telehealth: Payer: Self-pay

## 2023-11-23 VITALS — BP 119/54 | HR 75 | Temp 97.7°F | Resp 18 | Ht 70.0 in | Wt 228.0 lb

## 2023-11-23 DIAGNOSIS — I6523 Occlusion and stenosis of bilateral carotid arteries: Secondary | ICD-10-CM | POA: Insufficient documentation

## 2023-11-23 DIAGNOSIS — M25552 Pain in left hip: Secondary | ICD-10-CM | POA: Insufficient documentation

## 2023-11-23 DIAGNOSIS — M7072 Other bursitis of hip, left hip: Secondary | ICD-10-CM | POA: Diagnosis present

## 2023-11-23 DIAGNOSIS — G894 Chronic pain syndrome: Secondary | ICD-10-CM

## 2023-11-23 DIAGNOSIS — M1612 Unilateral primary osteoarthritis, left hip: Secondary | ICD-10-CM | POA: Insufficient documentation

## 2023-11-23 DIAGNOSIS — G8929 Other chronic pain: Secondary | ICD-10-CM | POA: Diagnosis present

## 2023-11-23 DIAGNOSIS — Z7901 Long term (current) use of anticoagulants: Secondary | ICD-10-CM

## 2023-11-23 DIAGNOSIS — Z8673 Personal history of transient ischemic attack (TIA), and cerebral infarction without residual deficits: Secondary | ICD-10-CM | POA: Insufficient documentation

## 2023-11-23 NOTE — Telephone Encounter (Signed)
 He needs clearance to stop Plavix for 7 days before scheduling.

## 2023-11-23 NOTE — Patient Instructions (Addendum)
 We will call you when we get clearance to stop the Plavix You will stop Plavix for 7 days and take your blood pressure med with a small sip of water the morning of the procedue. ______________________________________________________________________    Blood Thinners  IMPORTANT NOTICE:  If you take any of these, make sure to notify the nursing staff.  Failure to do so may result in serious injury.  Recommended time intervals to stop and restart blood-thinners, before & after invasive procedures  Generic Name Brand Name Pre-procedure: Stop medication for this amount of time before your procedure: Post-procedure: Wait this amount of time after the procedure before restarting your medication:  Abciximab Reopro 15 days 2 hrs  Alteplase Activase 10 days 10 days  Anagrelide Agrylin    Apixaban Eliquis 3 days 6 hrs  Cilostazol Pletal 3 days 5 hrs  Clopidogrel Plavix 7-10 days 2 hrs  Dabigatran Pradaxa 5 days 6 hrs  Dalteparin Fragmin 24 hours 4 hrs  Dipyridamole Aggrenox 11days 2 hrs  Edoxaban Lixiana; Savaysa 3 days 2 hrs  Enoxaparin  Lovenox 24 hours 4 hrs  Eptifibatide Integrillin 8 hours 2 hrs  Fondaparinux  Arixtra 72 hours 12 hrs  Hydroxychloroquine Plaquenil 11 days   Prasugrel Effient 7-10 days 6 hrs  Reteplase Retavase 10 days 10 days  Rivaroxaban Xarelto 3 days 6 hrs  Ticagrelor Brilinta 5-7 days 6 hrs  Ticlopidine Ticlid 10-14 days 2 hrs  Tinzaparin Innohep 24 hours 4 hrs  Tirofiban Aggrastat 8 hours 2 hrs  Warfarin Coumadin 5 days 2 hrs   Other medications with blood-thinning effects  Product indications Generic (Brand) names Note  Cholesterol Lipitor Stop 4 days before procedure  Blood thinner (injectable) Heparin (LMW or LMWH Heparin) Stop 24 hours before procedure  Cancer Ibrutinib (Imbruvica) Stop 7 days before procedure  Malaria/Rheumatoid Hydroxychloroquine (Plaquenil) Stop 11 days before procedure  Thrombolytics  10 days before or after procedures    Over-the-counter (OTC) Products with blood-thinning effects  Product Common names Stop Time  Aspirin > 325 mg Goody Powders, Excedrin, etc. 11 days  Aspirin <= 81 mg  7 days  Fish oil  4 days  Garlic supplements  7 days  Ginkgo biloba  36 hours  Ginseng  24 hours  NSAIDs Ibuprofen, Naprosyn, etc. 3 days  Vitamin E  4 days   ______________________________________________________________________      ______________________________________________________________________    Procedure instructions  Stop blood-thinners  Do not eat or drink fluids (other than water) for 6 hours before your procedure  No water for 2 hours before your procedure  Take your blood pressure medicine with a sip of water  Arrive 30 minutes before your appointment  If sedation is planned, bring suitable driver. Pennie Banter, Benedetto Goad, & public transportation are NOT APPROVED)  Carefully read the "Preparing for your procedure" detailed instructions  If you have questions call us at [phone]  Procedure appointments are for procedures only. NO medication refills or new problem evaluations.   ______________________________________________________________________      ______________________________________________________________________    Preparing for your procedure  Appointments: If you think you may not be able to keep your appointment, call 24-48 hours in advance to cancel. We need time to make it available to others.  Procedure visits are for procedures only. During your procedure appointment there will be: NO Prescription Refills*. NO medication changes or discussions*. NO discussion of disability issues*. NO unrelated pain problem evaluations*. NO evaluations to order other pain procedures*. *These will be addressed at a separate  and distinct evaluation encounter on the provider's evaluation schedule and not during procedure days.  Instructions: Food intake: Avoid eating anything solid for  at least 8 hours prior to your procedure. Clear liquid intake: You may take clear liquids such as water up to 2 hours prior to your procedure. (No carbonated drinks. No soda.) Transportation: Unless otherwise stated by your physician, bring a driver. (Driver cannot be a Market researcher, Pharmacist, community, or any other form of public transportation.) Morning Medicines: Except for blood thinners, take all of your other morning medications with a sip of water. Make sure to take your heart and blood pressure medicines. If your blood pressure's lower number is above 100, the case will be rescheduled. Blood thinners: Make sure to stop your blood thinners as instructed.  If you take a blood thinner, but were not instructed to stop it, call our office [phone] and ask to talk to a nurse. Not stopping a blood thinner prior to certain procedures could lead to serious complications. Diabetics on insulin: Notify the staff so that you can be scheduled 1st case in the morning. If your diabetes requires high dose insulin, take only  of your normal insulin dose the morning of the procedure and notify the staff that you have done so. Preventing infections: Shower with an antibacterial soap the morning of your procedure.  Build-up your immune system: Take 1000 mg of Vitamin C with every meal (3 times a day) the day prior to your procedure. Antibiotics: Inform the nursing staff if you are taking any antibiotics or if you have any conditions that may require antibiotics prior to procedures. (Example: recent joint implants)   Pregnancy: If you are pregnant make sure to notify the nursing staff. Not doing so may result in injury to the fetus, including death.  Sickness: If you have a cold, fever, or any active infections, call and cancel or reschedule your procedure. Receiving steroids while having an infection may result in complications. Arrival: You must be in the facility at least 30 minutes prior to your scheduled procedure. Tardiness:  Your scheduled time is also the cutoff time. If you do not arrive at least 15 minutes prior to your procedure, you will be rescheduled.  Children: Do not bring any children with you. Make arrangements to keep them home. Dress appropriately: There is always a possibility that your clothing may get soiled. Avoid long dresses. Valuables: Do not bring any jewelry or valuables.  Reasons to call and reschedule or cancel your procedure: (Following these recommendations will minimize the risk of a serious complication.) Surgeries: Avoid having procedures within 2 weeks of any surgery. (Avoid for 2 weeks before or after any surgery). Flu Shots: Avoid having procedures within 2 weeks of a flu shots or . (Avoid for 2 weeks before or after immunizations). Barium: Avoid having a procedure within 7-10 days after having had a radiological study involving the use of radiological contrast. (Myelograms, Barium swallow or enema study). Heart attacks: Avoid any elective procedures or surgeries for the initial 6 months after a "Myocardial Infarction" (Heart Attack). Blood thinners: It is imperative that you stop these medications before procedures. Let us know if you if you take any blood thinner.  Infection: Avoid procedures during or within two weeks of an infection (including chest colds or gastrointestinal problems). Symptoms associated with infections include: Localized redness, fever, chills, night sweats or profuse sweating, burning sensation when voiding, cough, congestion, stuffiness, runny nose, sore throat, diarrhea, nausea, vomiting, cold or Flu symptoms, recent  or current infections. It is specially important if the infection is over the area that we intend to treat. Heart and lung problems: Symptoms that may suggest an active cardiopulmonary problem include: cough, chest pain, breathing difficulties or shortness of breath, dizziness, ankle swelling, uncontrolled high or unusually low blood pressure, and/or  palpitations. If you are experiencing any of these symptoms, cancel your procedure and contact your primary care physician for an evaluation.  Remember:  Regular Business hours are:  Monday to Thursday 8:00 AM to 4:00 PM  Provider's Schedule: Delano Metz, MD:  Procedure days: Tuesday and Thursday 7:30 AM to 4:00 PM  Edward Jolly, MD:  Procedure days: Monday and Wednesday 7:30 AM to 4:00 PM Last  Updated: 09/06/2023 ______________________________________________________________________      ______________________________________________________________________    General Risks and Possible Complications  Patient Responsibilities: It is important that you read this as it is part of your informed consent. It is our duty to inform you of the risks and possible complications associated with treatments offered to you. It is your responsibility as a patient to read this and to ask questions about anything that is not clear or that you believe was not covered in this document.  Patient's Rights: You have the right to refuse treatment. You also have the right to change your mind, even after initially having agreed to have the treatment done. However, under this last option, if you wait until the last second to change your mind, you may be charged for the materials used up to that point.  Introduction: Medicine is not an Visual merchandiser. Everything in Medicine, including the lack of treatment(s), carries the potential for danger, harm, or loss (which is by definition: Risk). In Medicine, a complication is a secondary problem, condition, or disease that can aggravate an already existing one. All treatments carry the risk of possible complications. The fact that a side effects or complications occurs, does not imply that the treatment was conducted incorrectly. It must be clearly understood that these can happen even when everything is done following the highest safety standards.  No treatment: You  can choose not to proceed with the proposed treatment alternative. The "PRO(s)" would include: avoiding the risk of complications associated with the therapy. The "CON(s)" would include: not getting any of the treatment benefits. These benefits fall under one of three categories: diagnostic; therapeutic; and/or palliative. Diagnostic benefits include: getting information which can ultimately lead to improvement of the disease or symptom(s). Therapeutic benefits are those associated with the successful treatment of the disease. Finally, palliative benefits are those related to the decrease of the primary symptoms, without necessarily curing the condition (example: decreasing the pain from a flare-up of a chronic condition, such as incurable terminal cancer).  General Risks and Complications: These are associated to most interventional treatments. They can occur alone, or in combination. They fall under one of the following six (6) categories: no benefit or worsening of symptoms; bleeding; infection; nerve damage; allergic reactions; and/or death. No benefits or worsening of symptoms: In Medicine there are no guarantees, only probabilities. No healthcare provider can ever guarantee that a medical treatment will work, they can only state the probability that it may. Furthermore, there is always the possibility that the condition may worsen, either directly, or indirectly, as a consequence of the treatment. Bleeding: This is more common if the patient is taking a blood thinner, either prescription or over the counter (example: Goody Powders, Fish oil, Aspirin, Garlic, etc.), or if suffering a  condition associated with impaired coagulation (example: Hemophilia, cirrhosis of the liver, low platelet counts, etc.). However, even if you do not have one on these, it can still happen. If you have any of these conditions, or take one of these drugs, make sure to notify your treating physician. Infection: This is more  common in patients with a compromised immune system, either due to disease (example: diabetes, cancer, human immunodeficiency virus [HIV], etc.), or due to medications or treatments (example: therapies used to treat cancer and rheumatological diseases). However, even if you do not have one on these, it can still happen. If you have any of these conditions, or take one of these drugs, make sure to notify your treating physician. Nerve Damage: This is more common when the treatment is an invasive one, but it can also happen with the use of medications, such as those used in the treatment of cancer. The damage can occur to small secondary nerves, or to large primary ones, such as those in the spinal cord and brain. This damage may be temporary or permanent and it may lead to impairments that can range from temporary numbness to permanent paralysis and/or brain death. Allergic Reactions: Any time a substance or material comes in contact with our body, there is the possibility of an allergic reaction. These can range from a mild skin rash (contact dermatitis) to a severe systemic reaction (anaphylactic reaction), which can result in death. Death: In general, any medical intervention can result in death, most of the time due to an unforeseen complication. ______________________________________________________________________

## 2023-11-23 NOTE — Telephone Encounter (Signed)
 Dr Laban Emperor would like clearance for the patient to stop his Plavix for 7 days prior to having a hip injection..  Thank you

## 2023-11-24 NOTE — Telephone Encounter (Signed)
 That would be fine to stop plavix for 7 days prior to hip injection

## 2023-12-01 ENCOUNTER — Ambulatory Visit
Admission: RE | Admit: 2023-12-01 | Discharge: 2023-12-01 | Disposition: A | Discharge status: Home / Self Care | Source: Ambulatory Visit | Attending: Pain Medicine | Admitting: Pain Medicine

## 2023-12-01 ENCOUNTER — Ambulatory Visit: Attending: Pain Medicine | Admitting: Pain Medicine

## 2023-12-01 ENCOUNTER — Encounter: Payer: Self-pay | Admitting: Pain Medicine

## 2023-12-01 VITALS — BP 119/57 | HR 52 | Temp 97.8°F | Resp 17 | Ht 70.0 in | Wt 225.0 lb

## 2023-12-01 DIAGNOSIS — M7072 Other bursitis of hip, left hip: Secondary | ICD-10-CM | POA: Insufficient documentation

## 2023-12-01 DIAGNOSIS — M25552 Pain in left hip: Secondary | ICD-10-CM | POA: Insufficient documentation

## 2023-12-01 DIAGNOSIS — G8929 Other chronic pain: Secondary | ICD-10-CM | POA: Diagnosis not present

## 2023-12-01 DIAGNOSIS — Z7901 Long term (current) use of anticoagulants: Secondary | ICD-10-CM | POA: Insufficient documentation

## 2023-12-01 DIAGNOSIS — M1612 Unilateral primary osteoarthritis, left hip: Secondary | ICD-10-CM | POA: Diagnosis present

## 2023-12-01 MED ORDER — LIDOCAINE HCL 2 % IJ SOLN
20.0000 mL | Freq: Once | INTRAMUSCULAR | Status: AC
  Administered 2023-12-01: 400 mg
  Filled 2023-12-01: qty 20

## 2023-12-01 MED ORDER — ROPIVACAINE HCL 2 MG/ML IJ SOLN
9.0000 mL | Freq: Once | INTRAMUSCULAR | Status: AC
  Administered 2023-12-01: 9 mL via INTRA_ARTICULAR
  Filled 2023-12-01: qty 20

## 2023-12-01 MED ORDER — METHYLPREDNISOLONE ACETATE 80 MG/ML IJ SUSP
80.0000 mg | Freq: Once | INTRAMUSCULAR | Status: AC
  Administered 2023-12-01: 80 mg via INTRA_ARTICULAR
  Filled 2023-12-01: qty 1

## 2023-12-01 MED ORDER — IOPAMIDOL (ISOVUE-M 200) INJECTION 41%: 10.0000 mL | Freq: Once | INTRAMUSCULAR | Status: DC

## 2023-12-01 MED ORDER — IOHEXOL 180 MG/ML  SOLN
INTRAMUSCULAR | Status: AC
  Filled 2023-12-01: qty 20

## 2023-12-01 MED ORDER — MIDAZOLAM HCL 2 MG/2ML IJ SOLN: 0.5000 mg | Freq: Once | INTRAMUSCULAR | Status: DC

## 2023-12-01 MED ORDER — PENTAFLUOROPROP-TETRAFLUOROETH EX AERO
INHALATION_SPRAY | Freq: Once | CUTANEOUS | Status: AC
  Administered 2023-12-01: 30 via TOPICAL

## 2023-12-01 NOTE — Patient Instructions (Signed)
 ______________________________________________________________________    Post-Procedure Discharge Instructions  Instructions: Apply ice:  Purpose: This will minimize any swelling and discomfort after procedure.  When: Day of procedure, as soon as you get home. How: Fill a plastic sandwich bag with crushed ice. Cover it with a small towel and apply to injection site. How long: (15 min on, 15 min off) Apply for 15 minutes then remove x 15 minutes.  Repeat sequence on day of procedure, until you go to bed. Apply heat:  Purpose: To treat any soreness and discomfort from the procedure. When: Starting the next day after the procedure. How: Apply heat to procedure site starting the day following the procedure. How long: May continue to repeat daily, until discomfort goes away. Food intake: Start with clear liquids (like water) and advance to regular food, as tolerated.  Physical activities: Keep activities to a minimum for the first 8 hours after the procedure. After that, then as tolerated. Driving: If you have received any sedation, be responsible and do not drive. You are not allowed to drive for 24 hours after having sedation. Blood thinner: (Applies only to those taking blood thinners) You may restart your blood thinner 6 hours after your procedure. Insulin: (Applies only to Diabetic patients taking insulin) As soon as you can eat, you may resume your normal dosing schedule. Infection prevention: Keep procedure site clean and dry. Shower daily and clean area with soap and water. Post-procedure Pain Diary: Extremely important that this be done correctly and accurately. Recorded information will be used to determine the next step in treatment. For the purpose of accuracy, follow these rules: Evaluate only the area treated. Do not report or include pain from an untreated area. For the purpose of this evaluation, ignore all other areas of pain, except for the treated area. After your procedure,  avoid taking a long nap and attempting to complete the pain diary after you wake up. Instead, set your alarm clock to go off every hour, on the hour, for the initial 8 hours after the procedure. Document the duration of the numbing medicine, and the relief you are getting from it. Do not go to sleep and attempt to complete it later. It will not be accurate. If you received sedation, it is likely that you were given a medication that may cause amnesia. Because of this, completing the diary at a later time may cause the information to be inaccurate. This information is needed to plan your care. Follow-up appointment: Keep your post-procedure follow-up evaluation appointment after the procedure (usually 2 weeks for most procedures, 6 weeks for radiofrequencies). DO NOT FORGET to bring you pain diary with you.   Expect: (What should I expect to see with my procedure?) From numbing medicine (AKA: Local Anesthetics): Numbness or decrease in pain. You may also experience some weakness, which if present, could last for the duration of the local anesthetic. Onset: Full effect within 15 minutes of injected. Duration: It will depend on the type of local anesthetic used. On the average, 1 to 8 hours.  From steroids (Applies only if steroids were used): Decrease in swelling or inflammation. Once inflammation is improved, relief of the pain will follow. Onset of benefits: Depends on the amount of swelling present. The more swelling, the longer it will take for the benefits to be seen. In some cases, up to 10 days. Duration: Steroids will stay in the system x 2 weeks. Duration of benefits will depend on multiple posibilities including persistent irritating factors. Side-effects: If  present, they may typically last 2 weeks (the duration of the steroids). Frequent: Cramps (if they occur, drink Gatorade and take over-the-counter Magnesium 450-500 mg once to twice a day); water retention with temporary weight gain;  increases in blood sugar; decreased immune system response; increased appetite. Occasional: Facial flushing (red, warm cheeks); mood swings; menstrual changes. Uncommon: Long-term decrease or suppression of natural hormones; bone thinning. (These are more common with higher doses or more frequent use. This is why we prefer that our patients avoid having any injection therapies in other practices.)  Very Rare: Severe mood changes; psychosis; aseptic necrosis. From procedure: Some discomfort is to be expected once the numbing medicine wears off. This should be minimal if ice and heat are applied as instructed.  Call if: (When should I call?) You experience numbness and weakness that gets worse with time, as opposed to wearing off. New onset bowel or bladder incontinence. (Applies only to procedures done in the spine)  Emergency Numbers: Durning business hours (Monday - Thursday, 8:00 AM - 4:00 PM) (Friday, 9:00 AM - 12:00 Noon): [phone] After hours: [phone] NOTE: If you are having a problem and are unable connect with, or to talk to a provider, then go to your nearest urgent care or emergency department. If the problem is serious and urgent, please call 911. ______________________________________________________________________      ______________________________________________________________________    Blood Thinners  IMPORTANT NOTICE:  If you take any of these, make sure to notify the nursing staff.  Failure to do so may result in serious injury.  Recommended time intervals to stop and restart blood-thinners, before & after invasive procedures  Generic Name Brand Name Pre-procedure: Stop medication for this amount of time before your procedure: Post-procedure: Wait this amount of time after the procedure before restarting your medication:  Abciximab Reopro 15 days 2 hrs  Alteplase Activase 10 days 10 days  Anagrelide Agrylin    Apixaban Eliquis 3 days 6 hrs   Cilostazol Pletal 3 days 5 hrs  Clopidogrel Plavix 7-10 days 2 hrs  Dabigatran Pradaxa 5 days 6 hrs  Dalteparin Fragmin 24 hours 4 hrs  Dipyridamole Aggrenox 11days 2 hrs  Edoxaban Lixiana; Savaysa 3 days 2 hrs  Enoxaparin  Lovenox 24 hours 4 hrs  Eptifibatide Integrillin 8 hours 2 hrs  Fondaparinux  Arixtra 72 hours 12 hrs  Hydroxychloroquine Plaquenil 11 days   Prasugrel Effient 7-10 days 6 hrs  Reteplase Retavase 10 days 10 days  Rivaroxaban Xarelto 3 days 6 hrs  Ticagrelor Brilinta 5-7 days 6 hrs  Ticlopidine Ticlid 10-14 days 2 hrs  Tinzaparin Innohep 24 hours 4 hrs  Tirofiban Aggrastat 8 hours 2 hrs  Warfarin Coumadin 5 days 2 hrs   Other medications with blood-thinning effects  NOTE: Consider stopping these if you have prolonged bleeding despite not taking any of the above blood thinners. Otherwise ask your provider and this will be decided on a case-by-case basis.  Product indications Generic (Brand) names Note  Cholesterol Lipitor Stop 4 days before procedure  Blood thinner (injectable) Heparin (LMW or LMWH Heparin) Stop 24 hours before procedure  Cancer Ibrutinib (Imbruvica) Stop 7 days before procedure  Malaria/Rheumatoid Hydroxychloroquine (Plaquenil) Stop 11 days before procedure  Thrombolytics  10 days before or after procedures   Over-the-counter (OTC) Products with blood-thinning effects  NOTE: Consider stopping these if you have prolonged bleeding despite not taking any of the above blood thinners. Otherwise ask your provider and this will be decided on a case-by-case basis.  Product Common names Stop Time  Aspirin > 325 mg Goody Powders, Excedrin, etc. 11 days  Aspirin <= 81 mg  7 days  Fish oil  4 days  Garlic supplements  7 days  Ginkgo biloba  36 hours  Ginseng  24 hours  NSAIDs Ibuprofen, Naprosyn, etc. 3 days  Vitamin E  4 days   ______________________________________________________________________

## 2023-12-01 NOTE — Progress Notes (Signed)
 PROVIDER NOTE: Interpretation of information contained herein should be left to medically-trained personnel. Specific patient instructions are provided elsewhere under "Patient Instructions" section of medical record. This document was created in part using STT-dictation technology, any transcriptional errors that may result from this process are unintentional.  Patient: Brandon Bright Type: Established DOB: 04-01-1946 MRN: [id number] PCP: Wilford Corner, PA-C  Service: Procedure DOS: 12/01/2023 Setting: Ambulatory Location: Ambulatory outpatient facility Delivery: Face-to-face Provider: Oswaldo Done, MD Specialty: Interventional Pain Management Specialty designation: 09 Location: Outpatient facility Ref. Prov.: Delano Metz, MD       Interventional Therapy   Procedure: Intra-articular hip injection  #2  Laterality: Left (-LT)  Approach: Percutaneous posterolateral approach. Level: Lower pelvic and hip joint level.  Imaging: Fluoroscopy-guided Non-spinal ([id number]) Anesthesia: Local anesthesia (1-2% Lidocaine) Anxiolysis: None                 Sedation: No Sedation                       DOS: 12/01/2023  Performed by: Oswaldo Done, MD  Purpose: Diagnostic/Therapeutic Indications: Hip pain severe enough to impact quality of life or function. Rationale (medical necessity): procedure needed and proper for the diagnosis and/or treatment of Brandon Bright's medical symptoms and needs. 1. Chronic hip pain (Left)   2. Osteoarthritis of hip (Left)   3. Other bursitis of hip (Left)   4. Chronic anticoagulation (PLAVIX)    NAS-11 Pain score:   Pre-procedure: 9 /10   Post-procedure: 9 /10   Caution: Refers to PSIS as "Hip".   Target: Intra-articular hip joint Region: Hip joint, upper (proximal) femoral region Type of procedure: Percutaneous joint injection   Position / Prep / Materials:  Position: Lateral Decubitus with affected side up  Prep solution:  ChloraPrep (2% chlorhexidine gluconate and 70% isopropyl alcohol) Prep Area:  Entire Posterolateral hip area. Materials:  Tray: Block tray Needle(s):  Type: Spinal  Gauge (G): 22  Length: 7-in  Qty: 1  H&P (Pre-op Assessment):  Brandon Bright is a 78 y.o. (year old), male patient, seen today for interventional treatment. He  has a past surgical history that includes ganglion cyst removal (Right); Cataract extraction w/PHACO (Left, 02/04/2016); Colonoscopy; Pilonidal cyst excision; Cardiac catheterization (1999?); Cataract extraction w/PHACO (Right, 03/03/2016); Eye surgery (May and June); Colonoscopy with propofol (N/A, 06/05/2018); Colonoscopy with propofol (N/A, 03/21/2023); biopsy (03/21/2023); and polypectomy (03/21/2023). Brandon Bright has a current medication list which includes the following prescription(s): cholecalciferol, clopidogrel, gabapentin, hypertonic nasal wash, ipratropium, latanoprost, centrum silver 50+men, multiple vitamins-minerals, valsartan-hydrochlorothiazide, amlodipine, atorvastatin, and potassium chloride, and the following Facility-Administered Medications: iopamidol. His primarily concern today is the Hip Pain (left)  Initial Vital Signs:  Pulse/HCG Rate: (!) 52ECG Heart Rate: 79 Temp: 97.8 F (36.6 C) Resp: 17 BP: 118/70 SpO2: 95 %  BMI: Estimated body mass index is 32.28 kg/m as calculated from the following:   Height as of this encounter: 5\' 10"  (1.778 m).   Weight as of this encounter: 225 lb (102.1 kg).  Risk Assessment: Allergies: Reviewed. He is allergic to codeine, vicodin [hydrocodone-acetaminophen], celebrex [celecoxib], and ultram [tramadol].  Allergy Precautions: None required Coagulopathies: Reviewed. None identified.  Blood-thinner therapy: None at this time Active Infection(s): Reviewed. None identified. Brandon Bright is afebrile  Site Confirmation: Brandon Bright was asked to confirm the procedure and laterality before marking the site Procedure  checklist: Completed Consent: Before the procedure and under the influence of no sedative(s), amnesic(s), or anxiolytics, the patient  was informed of the treatment options, risks and possible complications. To fulfill our ethical and legal obligations, as recommended by the American Medical Association's Code of Ethics, I have informed the patient of my clinical impression; the nature and purpose of the treatment or procedure; the risks, benefits, and possible complications of the intervention; the alternatives, including doing nothing; the risk(s) and benefit(s) of the alternative treatment(s) or procedure(s); and the risk(s) and benefit(s) of doing nothing. The patient was provided information about the general risks and possible complications associated with the procedure. These may include, but are not limited to: failure to achieve desired goals, infection, bleeding, organ or nerve damage, allergic reactions, paralysis, and death. In addition, the patient was informed of those risks and complications associated to the procedure, such as failure to decrease pain; infection; bleeding; organ or nerve damage with subsequent damage to sensory, motor, and/or autonomic systems, resulting in permanent pain, numbness, and/or weakness of one or several areas of the body; allergic reactions; (i.e.: anaphylactic reaction); and/or death. Furthermore, the patient was informed of those risks and complications associated with the medications. These include, but are not limited to: allergic reactions (i.e.: anaphylactic or anaphylactoid reaction(s)); adrenal axis suppression; blood sugar elevation that in diabetics may result in ketoacidosis or comma; water retention that in patients with history of congestive heart failure may result in shortness of breath, pulmonary edema, and decompensation with resultant heart failure; weight gain; swelling or edema; medication-induced neural toxicity; particulate matter embolism and  blood vessel occlusion with resultant organ, and/or nervous system infarction; and/or aseptic necrosis of one or more joints. Finally, the patient was informed that Medicine is not an exact science; therefore, there is also the possibility of unforeseen or unpredictable risks and/or possible complications that may result in a catastrophic outcome. The patient indicated having understood very clearly. We have given the patient no guarantees and we have made no promises. Enough time was given to the patient to ask questions, all of which were answered to the patient's satisfaction. Mr. Beeck has indicated that he wanted to continue with the procedure. Attestation: I, the ordering provider, attest that I have discussed with the patient the benefits, risks, side-effects, alternatives, likelihood of achieving goals, and potential problems during recovery for the procedure that I have provided informed consent. Date  Time: 12/01/2023  8:01 AM  Pre-Procedure Preparation:  Monitoring: As per clinic protocol. Respiration, ETCO2, SpO2, BP, heart rate and rhythm monitor placed and checked for adequate function Safety Precautions: Patient was assessed for positional comfort and pressure points before starting the procedure. Time-out: I initiated and conducted the "Time-out" before starting the procedure, as per protocol. The patient was asked to participate by confirming the accuracy of the "Time Out" information. Verification of the correct person, site, and procedure were performed and confirmed by me, the nursing staff, and the patient. "Time-out" conducted as per Joint Commission's Universal Protocol (UP.01.01.01). Time: 0812 Start Time: 0812 hrs.  Description/Narrative of Procedure:          Rationale (medical necessity): procedure needed and proper for the diagnosis and/or treatment of the patient's medical symptoms and needs. Procedural Technique Safety Precautions: Aspiration looking for blood return was  conducted prior to all injections. At no point did we inject any substances, as a needle was being advanced. No attempts were made at seeking any paresthesias. Safe injection practices and needle disposal techniques used. Medications properly checked for expiration dates. SDV (single dose vial) medications used. Description of the Procedure: Protocol guidelines  were followed. The patient was assisted into a comfortable position. The target area was identified and the area prepped in the usual manner. Skin & deeper tissues infiltrated with local anesthetic. Appropriate amount of time allowed to pass for local anesthetics to take effect. The procedure needles were then advanced to the target area. Proper needle placement secured. Negative aspiration confirmed. Solution injected in intermittent fashion, asking for systemic symptoms every 0.5cc of injectate. The needles were then removed and the area cleansed, making sure to leave some of the prepping solution back to take advantage of its long term bactericidal properties.  Technical description of procedure:  Skin & deeper tissues infiltrated with local anesthetic. Appropriate amount of time allowed to pass for local anesthetics to take effect. The procedure needles were then advanced to the target area. Proper needle placement secured. Negative aspiration confirmed. Solution injected in intermittent fashion, asking for systemic symptoms every 0.5cc of injectate. The needles were then removed and the area cleansed, making sure to leave some of the prepping solution back to take advantage of its long term bactericidal properties.       Vitals:   12/01/23 0759 12/01/23 0811 12/01/23 0816 12/01/23 0821  BP: 118/70 112/75 108/68 (!) 119/57  Pulse: (!) 52     Resp:  17 18 17   Temp: 97.8 F (36.6 C)     SpO2: 95% 95% 97% 95%  Weight: 225 lb (102.1 kg)     Height: 5\' 10"  (1.778 m)        Start Time: 0812 hrs. End Time: 0821 hrs.  Imaging Guidance  (Non-Spinal):          Type of Imaging Technique: Fluoroscopy Guidance (Non-Spinal) Indication(s): Fluoroscopy guidance for needle placement to enhance accuracy in procedures requiring precise needle localization for targeted delivery of medication in or near specific anatomical locations not easily accessible without such real-time imaging assistance. Exposure Time: Please see nurses notes. Contrast: Before injecting any contrast, we confirmed that the patient did not have an allergy to iodine, shellfish, or radiological contrast. Once satisfactory needle placement was completed at the desired level, radiological contrast was injected. Contrast injected under live fluoroscopy. No contrast complications. See chart for type and volume of contrast used. Fluoroscopic Guidance: I was personally present during the use of fluoroscopy. "Tunnel Vision Technique" used to obtain the best possible view of the target area. Parallax error corrected before commencing the procedure. "Direction-depth-direction" technique used to introduce the needle under continuous pulsed fluoroscopy. Once target was reached, antero-posterior, oblique, and lateral fluoroscopic projection used confirm needle placement in all planes. Images permanently stored in EMR. Interpretation: I personally interpreted the imaging intraoperatively. Adequate needle placement confirmed in multiple planes. Appropriate spread of contrast into desired area was observed. No evidence of afferent or efferent intravascular uptake. Permanent images saved into the patient's record.  Post-operative Assessment:  Post-procedure Vital Signs:  Pulse/HCG Rate: (!) 5285 Temp: 97.8 F (36.6 C) Resp: 17 BP: (!) 119/57 SpO2: 95 %  EBL: None  Complications: No immediate post-treatment complications observed by team, or reported by patient.  Note: The patient tolerated the entire procedure well. A repeat set of vitals were taken after the procedure and the  patient was kept under observation following institutional policy, for this type of procedure. Post-procedural neurological assessment was performed, showing return to baseline, prior to discharge. The patient was provided with post-procedure discharge instructions, including a section on how to identify potential problems. Should any problems arise concerning this procedure, the patient was  given instructions to immediately contact us, at any time, without hesitation. In any case, we plan to contact the patient by telephone for a follow-up status report regarding this interventional procedure.  Comments:  No additional relevant information.  Plan of Care (POC)  Orders:  Orders Placed This Encounter  Procedures   HIP INJECTION    Scheduling Instructions:     Side: Left-sided     Sedation: Patient's choice.     Timeframe: Today   DG PAIN [facility] C-ARM 1-60 MIN NO REPORT    Intraoperative interpretation by procedural physician at [facility].    Standing Status:   Standing    Number of Occurrences:   1    Reason for exam::   Assistance in needle guidance and placement for procedures requiring needle placement in or near specific anatomical locations not easily accessible without such assistance.   Informed Consent Details: Physician/Practitioner Attestation; Transcribe to consent form and obtain patient signature    Nursing Order: Transcribe to consent form and obtain patient signature. Note: Always confirm laterality of pain with Mr. Ashland, before procedure.    Physician/Practitioner attestation of informed consent for procedure/surgical case:   I, the physician/practitioner, attest that I have discussed with the patient the benefits, risks, side effects, alternatives, likelihood of achieving goals and potential problems during recovery for the procedure that I have provided informed consent.    Procedure:   Hip injection    Physician/Practitioner performing the procedure:    Arthuro Canelo A. Laban Emperor, MD    Indication/Reason:   Hip Joint Pain (Arthralgia)   Care order/instruction: Please confirm that the patient has stopped the Plavix (Clopidogrel) x 7-10 days prior to procedure or surgery.    Please confirm that the patient has stopped the Plavix (Clopidogrel) x 7-10 days prior to procedure or surgery.    Standing Status:   Standing    Number of Occurrences:   1   Provide equipment / supplies at bedside    Procedure tray: "Block Tray" (Disposable  single use) Skin infiltration needle: Regular 1.5-in, 25-G, (x1) Block Needle type: Spinal Amount/quantity: 1 Size: Long (7-inch) Gauge: 22G    Standing Status:   Standing    Number of Occurrences:   1    Specify:   Block Tray   Bleeding precautions    Standing Status:   Standing    Number of Occurrences:   1   Chronic Opioid Analgesic:  No opioid analgesics prescribed by our practice.   Medications ordered for procedure: Meds ordered this encounter  Medications   DISCONTD: iopamidol (ISOVUE-M) 41 % intrathecal injection 10 mL    Must be Myelogram-compatible. If not available, you may substitute with a water-soluble, non-ionic, hypoallergenic, myelogram-compatible radiological contrast medium.   lidocaine (XYLOCAINE) 2 % (with pres) injection 400 mg   pentafluoroprop-tetrafluoroeth (GEBAUERS) aerosol   DISCONTD: midazolam (VERSED) injection 0.5-2 mg    Make sure Flumazenil is available in the pyxis when using this medication. If oversedation occurs, administer 0.2 mg IV over 15 sec. If after 45 sec no response, administer 0.2 mg again over 1 min; may repeat at 1 min intervals; not to exceed 4 doses (1 mg)   ropivacaine (PF) 2 mg/mL (0.2%) (NAROPIN) injection 9 mL   methylPREDNISolone acetate (DEPO-MEDROL) injection 80 mg   iopamidol (ISOVUE-M) 41 % intrathecal injection 10 mL    Must be Myelogram-compatible. If not available, you may substitute with a water-soluble, non-ionic, hypoallergenic, myelogram-compatible  radiological contrast medium.   Medications administered: We  administered lidocaine, pentafluoroprop-tetrafluoroeth, ropivacaine (PF) 2 mg/mL (0.2%), and methylPREDNISolone acetate.  See the medical record for exact dosing, route, and time of administration.  Follow-up plan:   Return in about 2 weeks (around 12/15/2023) for (Face2F), (PPE).       Interventional Therapies  Risk Factors  Considerations  Medical Comorbidities:  Plavix Anticoagulation: (Stop: 7-10 days  Restart: 2 hours)  TIAs  HTN  T2NIDDM  (B) Carotid Stenosis  RBBB     Planned  Pending:   Diagnostic/therapeutic left IA hip joint inj. #2    Under consideration:   Diagnostic right L3 TFESI  Diagnostic bilateral lumbar facet MBB  Diagnostic Trigger point injection  Diagnostic  bilateral cervical facet MBB     Completed:   Diagnostic/therapeutic right L4 TFESI x1 (04/01/2022) (100/100/100/100)  Diagnostic/therapeutic left IA hip joint injection x1 (09/09/2020) (100/100/100/100)    Therapeutic  Palliative (PRN) options:   Palliative/therapeutic left IA hip joint injection #2  Palliative right L4-5 LESI (none done by me)   Completed by other providers:   The patient indicated having had LESI x 4, by Dr. Metta Clines. (before 2000) (Procedure described as helpful).        Recent Visits Date Type Provider Dept  11/23/23 Office Visit Delano Metz, MD [facility]  Showing recent visits within past 90 days and meeting all other requirements Today's Visits Date Type Provider Dept  12/01/23 Procedure visit Delano Metz, MD [facility]  Showing today's visits and meeting all other requirements Future Appointments Date Type Provider Dept  12/20/23 Appointment Delano Metz, MD [facility]  Showing future appointments within next 90 days and meeting all other requirements  Disposition: Discharge home  Discharge (Date  Time): 12/01/2023; 0830 hrs.   Primary Care  Physician: Wilford Corner, PA-C Location: [facility] Note by: Oswaldo Done, MD (TTS technology used. I apologize for any typographical errors that were not detected and corrected.) Date: 12/01/2023; Time: 8:32 AM  Disclaimer:  Medicine is not an Visual merchandiser. The only guarantee in medicine is that nothing is guaranteed. It is important to note that the decision to proceed with this intervention was based on the information collected from the patient. The Data and conclusions were drawn from the patient's questionnaire, the interview, and the physical examination. Because the information was provided in large part by the patient, it cannot be guaranteed that it has not been purposely or unconsciously manipulated. Every effort has been made to obtain as much relevant data as possible for this evaluation. It is important to note that the conclusions that lead to this procedure are derived in large part from the available data. Always take into account that the treatment will also be dependent on availability of resources and existing treatment guidelines, considered by other Pain Management Practitioners as being common knowledge and practice, at the time of the intervention. For Medico-Legal purposes, it is also important to point out that variation in procedural techniques and pharmacological choices are the acceptable norm. The indications, contraindications, technique, and results of the above procedure should only be interpreted and judged by a Board-Certified Interventional Pain Specialist with extensive familiarity and expertise in the same exact procedure and technique.

## 2023-12-01 NOTE — Progress Notes (Signed)
 Safety precautions to be maintained throughout the outpatient stay will include: orient to surroundings, keep bed in low position, maintain call bell within reach at all times, provide assistance with transfer out of bed and ambulation.

## 2023-12-02 ENCOUNTER — Telehealth: Payer: Self-pay | Admitting: *Deleted

## 2023-12-02 NOTE — Telephone Encounter (Signed)
 Post procedure call; reports that he is doing fine, pain is less, continues to have pain when he moves his left leg.  However pain is better.  Pain score this morning 4 - 5.

## 2023-12-18 NOTE — Progress Notes (Unsigned)
 PROVIDER NOTE: Information contained herein reflects review and annotations entered in association with encounter. Interpretation of such information and data should be left to medically-trained personnel. Information provided to patient can be located elsewhere in the medical record under "Patient Instructions". Document created using STT-dictation technology, any transcriptional errors that may result from process are unintentional.    Patient: Brandon Bright  Service Category: E/M  Provider: Oswaldo Done, MD  DOB: 02-Jun-1946  DOS: 12/19/2023  Referring Provider: Wilford Bright  MRN: [id number]  Specialty: Interventional Pain Management  PCP: Brandon Corner, PA-C  Type: Established Patient  Setting: Ambulatory outpatient    Location: Office  Delivery: Face-to-face     HPI  [name], a 78 y.o. year old male, is here today because of his Chronic hip pain, left [M25.552, G89.29]. [name] primary complain today is No chief complaint on file.  Pertinent problems: 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 Pain Assessment: Severity of   is reported as a  /10. Location:    / . Onset:  . Quality:  . Timing:  . Modifying factor(s):  Marland Kitchen Vitals:  vitals were not taken for this visit.  BMI: Estimated body mass index is 32.28 kg/m as calculated from the following:   Height as of 12/01/23: 5\' 10"  (1.778 m).   Weight as of 12/01/23: 225 lb (102.1 kg). Last encounter: 11/23/2023. Last procedure: 12/01/2023.  Reason for encounter: post-procedure evaluation and assessment. ***  Discussed the use of AI scribe software for clinical note transcription with the patient, who gave verbal consent to proceed.  History of Present Illness          Post-procedure evaluation   Procedure: Intra-articular hip injection  #2  Laterality: Left (-LT)  Approach: Percutaneous posterolateral approach. Level: Lower pelvic and hip joint level.  Imaging: Fluoroscopy-guided Non-spinal ([id number]) Anesthesia: Local anesthesia (1-2% Lidocaine) Anxiolysis: None                 Sedation: No Sedation                       DOS: 12/01/2023  Performed by: Brandon Done, MD  Purpose: Diagnostic/Therapeutic Indications: Hip pain severe enough to impact quality of life or function. Rationale (medical necessity): procedure needed and proper for the diagnosis and/or treatment of Brandon Bright's medical symptoms and needs. 1. Chronic hip pain (Left)   2. Osteoarthritis of hip (Left)   3. Other bursitis of hip (Left)   4. Chronic anticoagulation (PLAVIX)    NAS-11 Pain score:   Pre-procedure: 9 /10   Post-procedure: 9 /10   Caution: Refers to PSIS as "Hip".    Effectiveness:  Initial hour after procedure:   ***. Subsequent 4-6 hours  post-procedure:   ***. Analgesia past initial 6 hours:   ***. Ongoing improvement:  Analgesic:  *** Function:    ***    ROM:    ***      Pharmacotherapy Assessment  Analgesic: No opioid analgesics prescribed by our practice.   Monitoring: Brandon Bright: PDMP reviewed during this  encounter.       Pharmacotherapy: No side-effects or adverse reactions reported. Compliance: No problems identified. Effectiveness: Clinically acceptable.  No notes on file  No results found for: "CBDTHCR" No results found for: "D8THCCBX" No results found for: "D9THCCBX"  UDS:  No results found for: "SUMMARY"    ROS  Constitutional: Denies any fever or chills Gastrointestinal: No reported hemesis, hematochezia, vomiting, or acute GI distress Musculoskeletal: Denies any acute onset joint swelling, redness, loss of ROM, or weakness Neurological: No reported episodes of acute onset apraxia, aphasia, dysarthria, agnosia, amnesia, paralysis, loss of coordination, or loss of consciousness  Medication Review  Centrum Silver 50+Men, Hypertonic Nasal Wash, Multiple Vitamins-Minerals, amLODipine, atorvastatin, cholecalciferol, clopidogrel, gabapentin, ipratropium, latanoprost, potassium chloride, and valsartan-hydrochlorothiazide  History Review  Allergy: [name] is allergic to codeine, vicodin [hydrocodone-acetaminophen], celebrex [celecoxib], and ultram [tramadol]. Drug: [name]  reports no history of drug use. Alcohol:  reports no history of alcohol use. Tobacco:  reports that he has never smoked. He has never used smokeless tobacco. Social: [name]  reports that he has never smoked. He has never used smokeless tobacco. He reports that he does not drink alcohol and does not use drugs. 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 Surgical: [name]  has a past surgical history that includes ganglion cyst removal (Right); Cataract extraction w/PHACO (Left, 02/04/2016);  Colonoscopy; Pilonidal cyst excision; Cardiac catheterization (1999?); Cataract extraction w/PHACO (Right, 03/03/2016); Eye surgery (May and June); Colonoscopy with propofol (N/A, 06/05/2018); Colonoscopy with propofol (N/A, 03/21/2023); biopsy (03/21/2023); and polypectomy (03/21/2023). Family: family history includes Dementia in his mother; Diabetes in his brother; Heart disease in his brother, brother, and father.  Laboratory Chemistry Profile   Renal Lab Results  Component Value Date   BUN 27 (H) 04/29/2023   CREATININE 0.96 04/29/2023   GFRAA >60 08/17/2014   GFRNONAA >60 04/29/2023    Hepatic Lab Results  Component Value Date   AST 21 04/28/2023   ALT 24 04/28/2023   ALBUMIN 3.8 04/28/2023   ALKPHOS 66 04/28/2023   LIPASE 75 08/16/2014    Electrolytes Lab Results  Component Value Date   NA 141 04/29/2023   K 3.6 04/29/2023   CL 105 04/29/2023   CALCIUM 9.6 04/29/2023   MG 2.2 04/28/2023    Bone No results found for: "VD25OH", "VD125OH2TOT", "PI9518AC1", "YS0630ZS0", "25OHVITD1", "25OHVITD2", "25OHVITD3", "TESTOFREE", "TESTOSTERONE"  Inflammation (CRP: Acute Phase) (ESR: Chronic Phase) Lab Results  Component Value Date   CRP 2 08/28/2020   ESRSEDRATE 26 08/28/2020         Note: Above Lab results reviewed.  Recent Imaging Review  DG HIP UNILAT W OR W/O PELVIS 2-3 VIEWS LEFT CLINICAL DATA:  Pain.  EXAM: DG HIP (WITH OR WITHOUT PELVIS) 3V LEFT  COMPARISON:  08/28/2020.  FINDINGS: There is bilateral hip degenerative change with joint space narrowing and small osteophytes. Pelvic ring is intact. No acute fracture, dislocation or subluxation. No osteolytic or osteoblastic lesions.  IMPRESSION: Bilateral  hip degenerative changes.  No acute osseous abnormalities.  Electronically Signed   By: Layla Maw M.D.   On: 12/11/2023 20:13 Note: Reviewed        Physical Exam  General appearance: Well nourished, well developed, and well hydrated. In no apparent acute  distress Mental status: Alert, oriented x 3 (person, place, & time)       Respiratory: No evidence of acute respiratory distress Eyes: PERLA Vitals: There were no vitals taken for this visit. BMI: Estimated body mass index is 32.28 kg/m as calculated from the following:   Height as of 12/01/23: 5\' 10"  (1.778 m).   Weight as of 12/01/23: 225 lb (102.1 kg). Ideal: Patient weight not recorded  Assessment   Diagnosis Status  1. Chronic hip pain (Left)   2. Other bursitis of hip (Left)   3. Chronic lower extremity pain (1ry area of Pain) (Right)   4. Osteoarthritis of hip (Left)   5. Postop check    Controlled Controlled Controlled   Updated Problems: No problems updated.  Plan of Care  Problem-specific:  Assessment and Plan            Mr. CARVIN ALMAS has a current medication list which includes the following long-term medication(s): amlodipine, atorvastatin, gabapentin, hypertonic nasal wash, ipratropium, potassium chloride, and valsartan-hydrochlorothiazide.  Pharmacotherapy (Medications Ordered): No orders of the defined types were placed in this encounter.  Orders:  No orders of the defined types were placed in this encounter.  Follow-up plan:   No follow-ups on file.      Interventional Therapies  Risk Factors  Considerations  Medical Comorbidities:  Plavix Anticoagulation: (Stop: 7-10 days  Restart: 2 hours)  TIAs  HTN  T2NIDDM  (B) Carotid Stenosis  RBBB     Planned  Pending:   Diagnostic/therapeutic left IA hip joint inj. #2    Under consideration:   Diagnostic right L3 TFESI  Diagnostic bilateral lumbar facet MBB  Diagnostic Trigger point injection  Diagnostic  bilateral cervical facet MBB     Completed:   Diagnostic/therapeutic right L4 TFESI x1 (04/01/2022) (100/100/100/100)  Diagnostic/therapeutic left IA hip joint injection x1 (09/09/2020) (100/100/100/100)    Therapeutic  Palliative (PRN) options:   Palliative/therapeutic left IA hip  joint injection #2  Palliative right L4-5 LESI (none Bright by me)   Completed by other providers:   The patient indicated having had LESI x 4, by Dr. Metta Clines. (before 2000) (Procedure described as helpful).        Recent Visits Date Type Provider Dept  12/01/23 Procedure visit Delano Metz, MD [facility]  11/23/23 Office Visit Delano Metz, MD [facility]  Showing recent visits within past 90 days and meeting all other requirements Future Appointments Date Type Provider Dept  12/19/23 Appointment Delano Metz, MD [facility]  Showing future appointments within next 90 days and meeting all other requirements  I discussed the assessment and treatment plan with the patient. The patient was provided an opportunity to ask questions and all were answered. The patient agreed with the plan and demonstrated an understanding of the instructions.  Patient advised to call back or seek an in-person evaluation if the symptoms or condition worsens.  Duration of encounter: *** minutes.  Total time on encounter, as per AMA guidelines included both the face-to-face and non-face-to-face time personally spent by the physician and/or other qualified health care professional(s) on the day of the encounter (includes time in activities that require the physician or other qualified  health care professional and does not include time in activities normally performed by clinical staff). Physician's time may include the following activities when performed: Preparing to see the patient (e.g., pre-charting review of records, searching for previously ordered imaging, lab work, and nerve conduction tests) Review of prior analgesic pharmacotherapies. Reviewing Bright Interpreting ordered tests (e.g., lab work, imaging, nerve conduction tests) Performing post-procedure evaluations, including interpretation of diagnostic procedures Obtaining and/or reviewing separately obtained  history Performing a medically appropriate examination and/or evaluation Counseling and educating the patient/family/caregiver Ordering medications, tests, or procedures Referring and communicating with other health care professionals (when not separately reported) Documenting clinical information in the electronic or other health record Independently interpreting results (not separately reported) and communicating results to the patient/ family/caregiver Care coordination (not separately reported)  Note by: Brandon Done, MD Date: 12/19/2023; Time: 5:54 AM

## 2023-12-19 ENCOUNTER — Encounter: Payer: Self-pay | Admitting: Pain Medicine

## 2023-12-19 ENCOUNTER — Ambulatory Visit: Attending: Pain Medicine | Admitting: Pain Medicine

## 2023-12-19 VITALS — BP 126/70 | HR 80 | Temp 97.5°F | Resp 16 | Ht 70.0 in | Wt 217.0 lb

## 2023-12-19 DIAGNOSIS — G8929 Other chronic pain: Secondary | ICD-10-CM | POA: Insufficient documentation

## 2023-12-19 DIAGNOSIS — M7072 Other bursitis of hip, left hip: Secondary | ICD-10-CM | POA: Insufficient documentation

## 2023-12-19 DIAGNOSIS — M79604 Pain in right leg: Secondary | ICD-10-CM | POA: Diagnosis present

## 2023-12-19 DIAGNOSIS — M25552 Pain in left hip: Secondary | ICD-10-CM | POA: Diagnosis not present

## 2023-12-19 DIAGNOSIS — Z09 Encounter for follow-up examination after completed treatment for conditions other than malignant neoplasm: Secondary | ICD-10-CM | POA: Diagnosis present

## 2023-12-19 DIAGNOSIS — M1612 Unilateral primary osteoarthritis, left hip: Secondary | ICD-10-CM | POA: Insufficient documentation

## 2023-12-19 NOTE — Patient Instructions (Signed)

## 2023-12-20 ENCOUNTER — Ambulatory Visit: Admitting: Pain Medicine

## 2023-12-27 ENCOUNTER — Other Ambulatory Visit: Payer: Self-pay | Admitting: Gastroenterology

## 2023-12-27 DIAGNOSIS — R634 Abnormal weight loss: Secondary | ICD-10-CM

## 2023-12-27 DIAGNOSIS — R109 Unspecified abdominal pain: Secondary | ICD-10-CM

## 2023-12-27 DIAGNOSIS — R197 Diarrhea, unspecified: Secondary | ICD-10-CM

## 2024-01-09 ENCOUNTER — Ambulatory Visit
Admission: RE | Admit: 2024-01-09 | Discharge: 2024-01-09 | Disposition: A | Discharge status: Home / Self Care | Source: Ambulatory Visit | Attending: Gastroenterology | Admitting: Gastroenterology

## 2024-01-09 DIAGNOSIS — R634 Abnormal weight loss: Secondary | ICD-10-CM | POA: Diagnosis present

## 2024-01-09 DIAGNOSIS — R197 Diarrhea, unspecified: Secondary | ICD-10-CM | POA: Insufficient documentation

## 2024-01-09 DIAGNOSIS — R109 Unspecified abdominal pain: Secondary | ICD-10-CM | POA: Diagnosis present

## 2024-01-09 MED ORDER — IOHEXOL 300 MG/ML  SOLN
100.0000 mL | Freq: Once | INTRAMUSCULAR | Status: AC | PRN
  Administered 2024-01-09: 100 mL via INTRAVENOUS

## 2024-05-06 NOTE — Progress Notes (Unsigned)
 PROVIDER NOTE: Interpretation of information contained herein should be left to medically-trained personnel. Specific patient instructions are provided elsewhere under Patient Instructions section of medical record. This document was created in part using AI and STT-dictation technology, any transcriptional errors that may result from this process are unintentional.  Patient: Brandon Bright  Service: E/M   PCP: Cyrus Selinda Moose, PA-C  DOB: [birth year]  DOS: 05/07/2024  Provider: Eric DELENA Como, MD  MRN: [id number]  Delivery: Face-to-face  Specialty: Interventional Pain Management  Type: Established Patient  Setting: Ambulatory outpatient facility  Specialty designation: 09  Referring Prov.: Cyrus Selinda Moose,*  Location: Outpatient office facility       History of present illness (HPI) Brandon Bright, a 78 y.o. year old male, is here today because of his Chronic pain of right lower extremity [M79.604, G89.29]. Brandon Bright primary complain today is No chief complaint on file.  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  Pain Assessment: Severity of   is reported as a  /10. Location:    / . Onset:  . Quality:  . Timing:  . Modifying factor(s):  SABRA Vitals:  vitals were not taken for this visit.  BMI: Estimated body mass index is 31.14 kg/m as calculated from the following:   Height as of 12/19/23: 5' 10 (1.778 m).   Weight as of 12/19/23: 217 lb (98.4 kg).  Last encounter: 12/19/2023. Last procedure: 12/01/2023.  Reason for encounter: evaluation of worsening, or previously known (established) problem.   Discussed the use of AI scribe software for clinical note transcription with the patient, who gave verbal consent to proceed.  History of Present Illness           Pharmacotherapy Assessment   No opioid analgesics prescribed by our practice.  Monitoring: [place] PMP: PDMP reviewed during this encounter.       Pharmacotherapy: No side-effects or adverse reactions reported. Compliance: No problems identified. Effectiveness: Clinically acceptable.  No notes on file  UDS:  No results found for: SUMMARY  No results found for: CBDTHCR No results found for: D8THCCBX No results found for: D9THCCBX  ROS  Constitutional: Denies any fever or chills Gastrointestinal: No reported hemesis, hematochezia, vomiting, or acute GI distress Musculoskeletal: Denies any acute onset joint swelling, redness, loss of ROM, or weakness Neurological: No reported episodes of acute onset apraxia, aphasia, dysarthria, agnosia, amnesia, paralysis, loss of coordination, or loss of consciousness  Medication Review  Centrum Silver  50+Men, Hypertonic Nasal Wash, Multiple Vitamins-Minerals, amLODipine , atorvastatin , cholecalciferol, clopidogrel , gabapentin , ipratropium, latanoprost, potassium chloride , and valsartan -hydrochlorothiazide   History Review  Allergy: Brandon Bright is allergic to codeine, vicodin [hydrocodone-acetaminophen], celebrex [celecoxib], and ultram [tramadol]. Drug: Brandon Bright   reports no history of drug use. Alcohol:  reports no history  of alcohol use. Tobacco:  reports that he has never smoked. He has never used smokeless tobacco. Social: Brandon Bright  reports that he has never smoked. He has never used smokeless tobacco. He reports that he does not drink alcohol and does not use drugs. 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 Surgical: Brandon Bright  has a past surgical history that includes ganglion cyst removal (Right); Cataract extraction w/PHACO (Left, 02/04/2016); Colonoscopy; Pilonidal cyst excision; Cardiac catheterization (1999?); Cataract extraction w/PHACO (Right, 03/03/2016); Eye surgery (May and June); Colonoscopy with propofol  (N/A, 06/05/2018); Colonoscopy with propofol  (N/A, 03/21/2023); biopsy (03/21/2023); and polypectomy (03/21/2023). Family: family history includes Dementia in his mother; Diabetes in his brother; Heart disease in his brother, brother, and father.  Laboratory Chemistry Profile   Renal Lab Results  Component Value Date   BUN 27 (H) 04/29/2023   CREATININE 0.96 04/29/2023   GFRAA >60 08/17/2014   GFRNONAA >60 04/29/2023    Hepatic Lab Results  Component Value Date   AST 21 04/28/2023   ALT 24 04/28/2023   ALBUMIN 3.8 04/28/2023   ALKPHOS 66 04/28/2023   LIPASE 75 08/16/2014    Electrolytes Lab Results  Component Value Date   NA 141 04/29/2023   K 3.6 04/29/2023   CL 105 04/29/2023   CALCIUM  9.6 04/29/2023   MG 2.2 04/28/2023    Bone No results found for: VD25OH, VD125OH2TOT, CI6874NY7, CI7874NY7, 25OHVITD1, 25OHVITD2, 25OHVITD3, TESTOFREE, TESTOSTERONE  Inflammation (CRP: Acute Phase) (ESR: Chronic  Phase) Lab Results  Component Value Date   CRP 2 08/28/2020   ESRSEDRATE 26 08/28/2020         Note: Above Lab results reviewed.  Recent Imaging Review  CT ABDOMEN PELVIS W CONTRAST CLINICAL DATA:  Poor appetite.  Weight loss.  Diarrhea.  EXAM: CT ABDOMEN AND PELVIS WITH CONTRAST  TECHNIQUE: Multidetector CT imaging of the abdomen and pelvis was performed using the standard protocol following bolus administration of intravenous contrast.  RADIATION DOSE REDUCTION: This exam was performed according to the departmental dose-optimization program which includes automated exposure control, adjustment of the mA and/or kV according to patient size and/or use of iterative reconstruction technique.  CONTRAST:  OMNIPAQUE  IOHEXOL  300 MG/ML  SOLN  COMPARISON:  CT 06/01/2018  FINDINGS: Lower chest: Lung bases are clear.  Hepatobiliary: No focal hepatic lesion. Multiple dense 1 cm gallstones. No gallbladder inflammation. No biliary duct dilatation. No biliary duct dilatation. Common bile duct is normal.  Pancreas: Pancreas is normal. No ductal dilatation. No pancreatic inflammation.  Spleen: Normal spleen  Adrenals/urinary tract: Adrenal glands normal. Exophytic lesion from the RIGHT kidney measures 2.9 cm has simple fluid attenuation on postcontrast imaging. Peripelvic cysts on the LEFT. Ureters and bladder normal.  Stomach/Bowel: Stomach, duodenum and small-bowel normal. Subtle haziness within the central mesentery not changed significantly from comparison exam. Terminal ileum normal. Appendix not identified. The colon and rectosigmoid colon are normal.  Vascular/Lymphatic: Abdominal aorta is normal caliber with atherosclerotic calcification. There is no retroperitoneal or periportal lymphadenopathy. No pelvic lymphadenopathy.  Reproductive: Prostate unremarkable  Other: No free fluid.  Musculoskeletal: No aggressive osseous lesion.  IMPRESSION: 1. No acute  findings in the abdomen pelvis. 2. Cholelithiasis without evidence cholecystitis. 3. RIGHT Bosniak I benign renal cyst measuring 2.9 cm. No follow-up imaging is recommended. JACR 2018 Feb; 264-273,  Management of the Incidental Renal Mass on CT, [employer] 2021; [id number], Bosniak Classification of Cystic Renal Masses, Version 2019. 4.  Aortic Atherosclerosis (ICD10-I70.0).  Electronically Signed   By: Jackquline Boxer M.D.   On: 01/18/2024 13:36 Note: Reviewed        Physical Exam  Vitals: There were no vitals taken for this visit. BMI: Estimated body mass index is 31.14 kg/m as calculated from the following:   Height as of 12/19/23: 5' 10 (1.778 m).   Weight as of 12/19/23: 217 lb (98.4 kg). Ideal: Patient weight not recorded General appearance: Well nourished, well developed, and well hydrated. In no apparent acute distress Mental status: Alert, oriented x 3 (person, place, & time)       Respiratory: No evidence of acute respiratory distress Eyes: PERLA   Assessment   Diagnosis Status  1. Chronic lower extremity pain (1ry area of Pain) (Right)   2. Chronic midline low back pain (2ry area of Pain) w/ sciatica (Right)   3. Lumbar radiculitis (Right) (L3 & L4)   4. Lumbosacral radiculopathy at L4 (Right)   5. Chronic anticoagulation (PLAVIX )    Controlled Controlled Controlled   Updated Problems: No problems updated.  Plan of Care  Problem-specific:  Assessment and Plan            Brandon Bright has a current medication list which includes the following long-term medication(s): amlodipine , atorvastatin , gabapentin , hypertonic nasal wash, ipratropium, potassium chloride , and valsartan -hydrochlorothiazide .  Pharmacotherapy (Medications Ordered): No orders of the defined types were placed in this encounter.  Orders:  No orders of the defined types were placed in this encounter.    Interventional Therapies  Risk Factors  Considerations  Medical  Comorbidities:  Plavix  Anticoagulation: (Stop: 7-10 days  Restart: 2 hours)  TIAs  HTN  T2NIDDM  (B) Carotid Stenosis  RBBB     Planned  Pending:      Under consideration:      Completed:   Diagnostic/therapeutic right L4 TFESI x1 (04/01/2022) (100/100/100/100)  Diagnostic/therapeutic left IA hip joint injection x2 (12/01/2023) (100/100/100/90-100)    Therapeutic  Palliative (PRN) options:   Palliative/therapeutic left IA hip joint injection #3  Palliative right L4-5 LESI (none done by me)   Completed by other providers:   The patient indicated having had LESI x 4, by Dr. Dannial. (before 2000) (Procedure described as helpful).       No follow-ups on file.    Recent Visits No visits were found meeting these conditions. Showing recent visits within past 90 days and meeting all other requirements Future Appointments Date Type Provider Dept  05/07/24 Appointment Tanya Glisson, MD [facility]  Showing future appointments within next 90 days and meeting all other requirements  I discussed the assessment and treatment plan with the patient. The patient was provided an opportunity to ask questions and all were answered. The patient agreed with the plan and demonstrated an understanding of the instructions.  Patient advised to call back or seek an in-person evaluation if the symptoms or condition worsens.  Duration of encounter: *** minutes.  Total time on encounter, as per AMA guidelines included both the face-to-face and non-face-to-face time personally spent by the physician and/or other qualified health care professional(s) on the day of the encounter (includes time in activities that require the physician or other qualified health care professional and does not include time in activities normally performed by clinical staff). Physician's time may include the following activities when performed: Preparing to  see the patient (e.g., pre-charting review of records,  searching for previously ordered imaging, lab work, and nerve conduction tests) Review of prior analgesic pharmacotherapies. Reviewing PMP Interpreting ordered tests (e.g., lab work, imaging, nerve conduction tests) Performing post-procedure evaluations, including interpretation of diagnostic procedures Obtaining and/or reviewing separately obtained history Performing a medically appropriate examination and/or evaluation Counseling and educating the patient/family/caregiver Ordering medications, tests, or procedures Referring and communicating with other health care professionals (when not separately reported) Documenting clinical information in the electronic or other health record Independently interpreting results (not separately reported) and communicating results to the patient/ family/caregiver Care coordination (not separately reported)  Note by: Eric DELENA Como, MD (TTS and AI technology used. I apologize for any typographical errors that were not detected and corrected.) Date: 05/07/2024; Time: 2:25 PM

## 2024-05-07 ENCOUNTER — Ambulatory Visit: Attending: Pain Medicine | Admitting: Pain Medicine

## 2024-05-07 ENCOUNTER — Encounter: Payer: Self-pay | Admitting: Pain Medicine

## 2024-05-07 ENCOUNTER — Telehealth: Payer: Self-pay

## 2024-05-07 VITALS — BP 137/66 | HR 75 | Temp 97.2°F | Resp 16 | Ht 70.0 in | Wt 220.0 lb

## 2024-05-07 DIAGNOSIS — Z7901 Long term (current) use of anticoagulants: Secondary | ICD-10-CM | POA: Diagnosis present

## 2024-05-07 DIAGNOSIS — M5416 Radiculopathy, lumbar region: Secondary | ICD-10-CM | POA: Diagnosis not present

## 2024-05-07 DIAGNOSIS — M5441 Lumbago with sciatica, right side: Secondary | ICD-10-CM | POA: Diagnosis present

## 2024-05-07 DIAGNOSIS — M79604 Pain in right leg: Secondary | ICD-10-CM | POA: Diagnosis present

## 2024-05-07 DIAGNOSIS — M5417 Radiculopathy, lumbosacral region: Secondary | ICD-10-CM | POA: Insufficient documentation

## 2024-05-07 DIAGNOSIS — G8929 Other chronic pain: Secondary | ICD-10-CM | POA: Insufficient documentation

## 2024-05-07 NOTE — Patient Instructions (Addendum)
 Epidural Steroid Injection Patient Information  Description: The epidural space surrounds the nerves as they exit the spinal cord.  In some patients, the nerves can be compressed and inflamed by a bulging disc or a tight spinal canal (spinal stenosis).  By injecting steroids into the epidural space, we can bring irritated nerves into direct contact with a potentially helpful medication.  These steroids act directly on the irritated nerves and can reduce swelling and inflammation which often leads to decreased pain.  Epidural steroids may be injected anywhere along the spine and from the neck to the low back depending upon the location of your pain.   After numbing the skin with local anesthetic (like Novocaine), a small needle is passed into the epidural space slowly.  You may experience a sensation of pressure while this is being done.  The entire block usually last less than 10 minutes.  Conditions which may be treated by epidural steroids:  Low back and leg pain Neck and arm pain Spinal stenosis Post-laminectomy syndrome Herpes zoster (shingles) pain Pain from compression fractures  Preparation for the injection:  Do not eat any solid food or dairy products within 8 hours of your appointment.  You may drink clear liquids up to 3 hours before appointment.  Clear liquids include water, black coffee, juice or soda.  No milk or cream please. You may take your regular medication, including pain medications, with a sip of water before your appointment  Diabetics should hold regular insulin (if taken separately) and take 1/2 normal NPH dos the morning of the procedure.  Carry some sugar containing items with you to your appointment. A driver must accompany you and be prepared to drive you home after your procedure.  Bring all your current medications with your. An IV may be inserted and sedation may be given at the discretion of the physician.   A blood pressure cuff, EKG and other monitors will  often be applied during the procedure.  Some patients may need to have extra oxygen administered for a short period. You will be asked to provide medical information, including your allergies, prior to the procedure.  We must know immediately if you are taking blood thinners (like Coumadin/Warfarin)  Or if you are allergic to IV iodine  contrast (dye). We must know if you could possible be pregnant.  Possible side-effects: Bleeding from needle site Infection (rare, may require surgery) Nerve injury (rare) Numbness & tingling (temporary) Difficulty urinating (rare, temporary) Spinal headache ( a headache worse with upright posture) Light -headedness (temporary) Pain at injection site (several days) Decreased blood pressure (temporary) Weakness in arm/leg (temporary) Pressure sensation in back/neck (temporary)  Call if you experience: Fever/chills associated with headache or increased back/neck pain. Headache worsened by an upright position. New onset weakness or numbness of an extremity below the injection site Hives or difficulty breathing (go to the emergency room) Inflammation or drainage at the infection site Severe back/neck pain Any new symptoms which are concerning to you  Please note:  Although the local anesthetic injected can often make your back or neck feel good for several hours after the injection, the pain will likely return.  It takes 3-7 days for steroids to work in the epidural space.  You may not notice any pain relief for at least that one week.  If effective, we will often do a series of three injections spaced 3-6 weeks apart to maximally decrease your pain.  After the initial series, we generally will wait several months before  considering a repeat injection of the same type.  If you have any questions, please call [phone] [facility] ______________________________________________________________________    Blood  Thinners  IMPORTANT NOTICE:  If you take any of these, make sure to notify the nursing staff.  Failure to do so may result in serious injury.  Recommended time intervals to stop and restart blood-thinners, before & after invasive procedures  Generic Name Brand Name Pre-procedure: Stop medication for this amount of time before your procedure: Post-procedure: Wait this amount of time after the procedure before restarting your medication:  Abciximab Reopro 15 days 2 hrs  Alteplase Activase 10 days 10 days  Anagrelide Agrylin    Apixaban Eliquis 3 days 6 hrs  Cilostazol Pletal 3 days 5 hrs  Clopidogrel  Plavix  7-10 days 2 hrs  Dabigatran Pradaxa 5 days 6 hrs  Dalteparin Fragmin 24 hours 4 hrs  Dipyridamole Aggrenox 11days 2 hrs  Edoxaban Lixiana; Savaysa 3 days 2 hrs  Enoxaparin   Lovenox  24 hours 4 hrs  Eptifibatide Integrillin 8 hours 2 hrs  Fondaparinux  Arixtra 72 hours 12 hrs  Hydroxychloroquine Plaquenil 11 days   Prasugrel Effient 7-10 days 6 hrs  Reteplase Retavase 10 days 10 days  Rivaroxaban Xarelto 3 days 6 hrs  Ticagrelor Brilinta 5-7 days 6 hrs  Ticlopidine Ticlid 10-14 days 2 hrs  Tinzaparin Innohep 24 hours 4 hrs  Tirofiban Aggrastat 8 hours 2 hrs  Warfarin Coumadin 5 days 2 hrs   Other medications with blood-thinning effects  NOTE: Consider stopping these if you have prolonged bleeding despite not taking any of the above blood thinners. Otherwise ask your provider and this will be decided on a case-by-case basis.  Product indications Generic (Brand) names Note  Cholesterol Lipitor Stop 4 days before procedure  Blood thinner (injectable) Heparin (LMW or LMWH Heparin) Stop 24 hours before procedure  Cancer Ibrutinib (Imbruvica) Stop 7 days before procedure  Malaria/Rheumatoid Hydroxychloroquine (Plaquenil) Stop 11 days before procedure  Thrombolytics  10 days before or after procedures   Over-the-counter (OTC) Products with blood-thinning effects  NOTE: Consider  stopping these if you have prolonged bleeding despite not taking any of the above blood thinners. Otherwise ask your provider and this will be decided on a case-by-case basis.  Product Common names Stop Time  Aspirin  > 325 mg Goody Powders, Excedrin, etc. 11 days  Aspirin  <= 81 mg  7 days  Fish oil  4 days  Garlic supplements  7 days  Ginkgo biloba  36 hours  Ginseng  24 hours  NSAIDs Ibuprofen, Naprosyn, etc. 3 days  Vitamin E  4 days   ______________________________________________________________________      ______________________________________________________________________    Procedure instructions  Stop blood-thinners  Do not eat or drink fluids (other than water) for 6 hours before your procedure  No water for 2 hours before your procedure  Take your blood pressure medicine with a sip of water  Arrive 30 minutes before your appointment  If sedation is planned, bring suitable driver. [place], [place], & public transportation are NOT APPROVED)  Carefully read the Preparing for your procedure detailed instructions  If you have questions call us  at [phone]  Procedure appointments are for procedures only.   NO medication refills or new problem evaluations will be done on procedure days.   Only the scheduled, pre-approved procedure and side will be done.   ______________________________________________________________________      ______________________________________________________________________    Preparing for your procedure  Appointments: If  you think you may not be able to keep your appointment, call 24-48 hours in advance to cancel. We need time to make it available to others.  Procedure visits are for procedures only. During your procedure appointment there will be: NO Prescription Refills*. NO medication changes or discussions*. NO discussion of disability issues*. NO unrelated pain problem evaluations*. NO evaluations to order other pain  procedures*. *These will be addressed at a separate and distinct evaluation encounter on the provider's evaluation schedule and not during procedure days.  Instructions: Food intake: Avoid eating anything solid for at least 8 hours prior to your procedure. Clear liquid intake: You may take clear liquids such as water up to 2 hours prior to your procedure. (No carbonated drinks. No soda.) Transportation: Unless otherwise stated by your physician, bring a driver. (Driver cannot be a Market researcher, Pharmacist, community, or any other form of public transportation.) Morning Medicines: Except for blood thinners, take all of your other morning medications with a sip of water. Make sure to take your heart and blood pressure medicines. If your blood pressure's lower number is above 100, the case will be rescheduled. Blood thinners: Make sure to stop your blood thinners as instructed.  If you take a blood thinner, but were not instructed to stop it, call our office [phone] and ask to talk to a nurse. Not stopping a blood thinner prior to certain procedures could lead to serious complications. Diabetics on insulin: Notify the staff so that you can be scheduled 1st case in the morning. If your diabetes requires high dose insulin, take only  of your normal insulin dose the morning of the procedure and notify the staff that you have done so. Preventing infections: Shower with an antibacterial soap the morning of your procedure.  Build-up your immune system: Take 1000 mg of Vitamin C with every meal (3 times a day) the day prior to your procedure. Antibiotics: Inform the nursing staff if you are taking any antibiotics or if you have any conditions that may require antibiotics prior to procedures. (Example: recent joint implants)   Pregnancy: If you are pregnant make sure to notify the nursing staff. Not doing so may result in injury to the fetus, including death.  Sickness: If you have a cold, fever, or any active infections, call  and cancel or reschedule your procedure. Receiving steroids while having an infection may result in complications. Arrival: You must be in the facility at least 30 minutes prior to your scheduled procedure. Tardiness: Your scheduled time is also the cutoff time. If you do not arrive at least 15 minutes prior to your procedure, you will be rescheduled.  Children: Do not bring any children with you. Make arrangements to keep them home. Dress appropriately: There is always a possibility that your clothing may get soiled. Avoid long dresses. Valuables: Do not bring any jewelry or valuables.  Reasons to call and reschedule or cancel your procedure: (Following these recommendations will minimize the risk of a serious complication.) Surgeries: Avoid having procedures within 2 weeks of any surgery. (Avoid for 2 weeks before or after any surgery). Flu Shots: Avoid having procedures within 2 weeks of a flu shots or . (Avoid for 2 weeks before or after immunizations). Barium: Avoid having a procedure within 7-10 days after having had a radiological study involving the use of radiological contrast. (Myelograms, Barium swallow or enema study). Heart attacks: Avoid any elective procedures or surgeries for the initial 6 months after a Myocardial Infarction (Heart Attack). Blood  thinners: It is imperative that you stop these medications before procedures. Let us  know if you if you take any blood thinner.  Infection: Avoid procedures during or within two weeks of an infection (including chest colds or gastrointestinal problems). Symptoms associated with infections include: Localized redness, fever, chills, night sweats or profuse sweating, burning sensation when voiding, cough, congestion, stuffiness, runny nose, sore throat, diarrhea, nausea, vomiting, cold or Flu symptoms, recent or current infections. It is specially important if the infection is over the area that we intend to treat. Heart and lung problems:  Symptoms that may suggest an active cardiopulmonary problem include: cough, chest pain, breathing difficulties or shortness of breath, dizziness, ankle swelling, uncontrolled high or unusually low blood pressure, and/or palpitations. If you are experiencing any of these symptoms, cancel your procedure and contact your primary care physician for an evaluation.  Remember:  Regular Business hours are:  Monday to Thursday 8:00 AM to 4:00 PM  Provider's Schedule: Eric Como, MD:  Procedure days: Tuesday and Thursday 7:30 AM to 4:00 PM  Wallie Sherry, MD:  Procedure days: Monday and Wednesday 7:30 AM to 4:00 PM Last  Updated: 09/06/2023 ______________________________________________________________________      ______________________________________________________________________    General Risks and Possible Complications  Patient Responsibilities: It is important that you read this as it is part of your informed consent. It is our duty to inform you of the risks and possible complications associated with treatments offered to you. It is your responsibility as a patient to read this and to ask questions about anything that is not clear or that you believe was not covered in this document.  Patient's Rights: You have the right to refuse treatment. You also have the right to change your mind, even after initially having agreed to have the treatment done. However, under this last option, if you wait until the last second to change your mind, you may be charged for the materials used up to that point.  Introduction: Medicine is not an Visual merchandiser. Everything in Medicine, including the lack of treatment(s), carries the potential for danger, harm, or loss (which is by definition: Risk). In Medicine, a complication is a secondary problem, condition, or disease that can aggravate an already existing one. All treatments carry the risk of possible complications. The fact that a side effects or  complications occurs, does not imply that the treatment was conducted incorrectly. It must be clearly understood that these can happen even when everything is done following the highest safety standards.  No treatment: You can choose not to proceed with the proposed treatment alternative. The "PRO(s)" would include: avoiding the risk of complications associated with the therapy. The "CON(s)" would include: not getting any of the treatment benefits. These benefits fall under one of three categories: diagnostic; therapeutic; and/or palliative. Diagnostic benefits include: getting information which can ultimately lead to improvement of the disease or symptom(s). Therapeutic benefits are those associated with the successful treatment of the disease. Finally, palliative benefits are those related to the decrease of the primary symptoms, without necessarily curing the condition (example: decreasing the pain from a flare-up of a chronic condition, such as incurable terminal cancer).  General Risks and Complications: These are associated to most interventional treatments. They can occur alone, or in combination. They fall under one of the following six (6) categories: no benefit or worsening of symptoms; bleeding; infection; nerve damage; allergic reactions; and/or death. No benefits or worsening of symptoms: In Medicine there are no guarantees, only probabilities.  No healthcare provider can ever guarantee that a medical treatment will work, they can only state the probability that it may. Furthermore, there is always the possibility that the condition may worsen, either directly, or indirectly, as a consequence of the treatment. Bleeding: This is more common if the patient is taking a blood thinner, either prescription or over the counter (example: Goody Powders, Fish oil, Aspirin , Garlic, etc.), or if suffering a condition associated with impaired coagulation (example: Hemophilia, cirrhosis of the liver, low  platelet counts, etc.). However, even if you do not have one on these, it can still happen. If you have any of these conditions, or take one of these drugs, make sure to notify your treating physician. Infection: This is more common in patients with a compromised immune system, either due to disease (example: diabetes, cancer, human immunodeficiency virus [HIV], etc.), or due to medications or treatments (example: therapies used to treat cancer and rheumatological diseases). However, even if you do not have one on these, it can still happen. If you have any of these conditions, or take one of these drugs, make sure to notify your treating physician. Nerve Damage: This is more common when the treatment is an invasive one, but it can also happen with the use of medications, such as those used in the treatment of cancer. The damage can occur to small secondary nerves, or to large primary ones, such as those in the spinal cord and brain. This damage may be temporary or permanent and it may lead to impairments that can range from temporary numbness to permanent paralysis and/or brain death. Allergic Reactions: Any time a substance or material comes in contact with our body, there is the possibility of an allergic reaction. These can range from a mild skin rash (contact dermatitis) to a severe systemic reaction (anaphylactic reaction), which can result in death. Death: In general, any medical intervention can result in death, most of the time due to an unforeseen complication. ______________________________________________________________________

## 2024-05-07 NOTE — Telephone Encounter (Signed)
 Dr Tanya would like clearance to stop Plavix  for 7 days for a Transforaminal E[pidural Steroid injection.  Thank you

## 2024-06-02 NOTE — Progress Notes (Unsigned)
 PROVIDER NOTE: Interpretation of information contained herein should be left to medically-trained personnel. Specific patient instructions are provided elsewhere under Patient Instructions section of medical record. This document was created in part using AI and STT-dictation technology, any transcriptional errors that may result from this process are unintentional.  Patient: Brandon Bright  Service: E/M   PCP: Cyrus Selinda Moose, PA-C  DOB: Jan 08, 1946  DOS: 06/04/2024  Provider: Eric DELENA Como, MD  MRN: [id number]  Delivery: Face-to-face  Specialty: Interventional Pain Management  Type: Established Patient  Setting: Ambulatory outpatient facility  Specialty designation: 09  Referring Prov.: Cyrus Selinda Moose,*  Location: Outpatient office facility       History of present illness (HPI) [name], a 78 y.o. year old male, is here today because of his Chronic hip pain, left [M25.552, G89.29]. Mr. [name] primary complain today is No chief complaint on file.  Pertinent problems: Mr. 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  Pain Assessment: Severity of   is reported as a  /10. Location:    / . Onset:  . Quality:  . Timing:  . Modifying factor(s):  SABRA Vitals:  vitals were not taken for this visit.  BMI: Estimated body mass index is 31.57 kg/m as calculated from the following:   Height as of 05/07/24: 5' 10 (1.778 m).   Weight as of 05/07/24: 220 lb (99.8 kg).  Last encounter: 05/07/2024. Last procedure: 12/01/2023.  Reason for encounter: evaluation of worsening, or previously known (established) problem.   Discussed the use of AI scribe software for clinical note transcription with the patient, who gave verbal consent to proceed.  History of Present Illness           Pharmacotherapy Assessment   Opioid Analgesic:  No opioid analgesics prescribed by our practice.  Monitoring: [place] PMP: PDMP reviewed during this encounter.       Pharmacotherapy: No side-effects or adverse reactions reported. Compliance: No problems identified. Effectiveness: Clinically acceptable.  No notes on file  UDS:  No results found for: SUMMARY  No results found for: CBDTHCR No results found for: D8THCCBX No results found for: D9THCCBX  ROS  Constitutional: Denies any fever or chills Gastrointestinal: No reported hemesis, hematochezia, vomiting, or acute GI distress Musculoskeletal: Denies any acute onset joint swelling, redness, loss of ROM, or weakness Neurological: No reported episodes of acute onset apraxia, aphasia, dysarthria, agnosia, amnesia, paralysis, loss of coordination, or loss of consciousness  Medication Review  Centrum Silver  50+Men, Hypertonic Nasal Wash, Multiple Vitamins-Minerals, amLODipine , atorvastatin , cholecalciferol, clopidogrel , gabapentin , ipratropium, latanoprost, potassium chloride , and valsartan -hydrochlorothiazide   History Review  Allergy: Mr. [name] is allergic to codeine, vicodin [hydrocodone-acetaminophen], celebrex [celecoxib], and ultram [tramadol]. Drug: Mr.  [name]  reports no history of drug use. Alcohol:  reports no  history of alcohol use. Tobacco:  reports that he has never smoked. He has never used smokeless tobacco. Social: Mr. Wollman  reports that he has never smoked. He has never used smokeless tobacco. He reports that he does not drink alcohol and does not use drugs. 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 Surgical: Mr. [name]  has a past surgical history that includes ganglion cyst removal (Right); Cataract extraction w/PHACO (Left, 02/04/2016); Colonoscopy; Pilonidal cyst excision; Cardiac catheterization (1999?); Cataract extraction w/PHACO (Right, 03/03/2016); Eye surgery (May and June); Colonoscopy with propofol  (N/A, 06/05/2018); Colonoscopy with propofol  (N/A, 03/21/2023); biopsy (03/21/2023); and polypectomy (03/21/2023). Family: family history includes Dementia in his mother; Diabetes in his brother; Heart disease in his brother, brother, and father.  Laboratory Chemistry Profile   Renal Lab Results  Component Value Date   BUN 27 (H) 04/29/2023   CREATININE 0.96 04/29/2023   GFRAA >60 08/17/2014   GFRNONAA >60 04/29/2023    Hepatic Lab Results  Component Value Date   AST 21 04/28/2023   ALT 24 04/28/2023   ALBUMIN 3.8 04/28/2023   ALKPHOS 66 04/28/2023   LIPASE 75 08/16/2014    Electrolytes Lab Results  Component Value Date   NA 141 04/29/2023   K 3.6 04/29/2023   CL 105 04/29/2023   CALCIUM  9.6 04/29/2023   MG 2.2 04/28/2023    Bone No results found for: VD25OH, VD125OH2TOT, CI6874NY7, CI7874NY7, 25OHVITD1, 25OHVITD2, 25OHVITD3, TESTOFREE, TESTOSTERONE  Inflammation (CRP: Acute Phase) (ESR:  Chronic Phase) Lab Results  Component Value Date   CRP 2 08/28/2020   ESRSEDRATE 26 08/28/2020         Note: Above Lab results reviewed.  Recent Imaging Review  CT ABDOMEN PELVIS W CONTRAST CLINICAL DATA:  Poor appetite.  Weight loss.  Diarrhea.  EXAM: CT ABDOMEN AND PELVIS WITH CONTRAST  TECHNIQUE: Multidetector CT imaging of the abdomen and pelvis was performed using the standard protocol following bolus administration of intravenous contrast.  RADIATION DOSE REDUCTION: This exam was performed according to the departmental dose-optimization program which includes automated exposure control, adjustment of the mA and/or kV according to patient size and/or use of iterative reconstruction technique.  CONTRAST:  OMNIPAQUE  IOHEXOL  300 MG/ML  SOLN  COMPARISON:  CT 06/01/2018  FINDINGS: Lower chest: Lung bases are clear.  Hepatobiliary: No focal hepatic lesion. Multiple dense 1 cm gallstones. No gallbladder inflammation. No biliary duct dilatation. No biliary duct dilatation. Common bile duct is normal.  Pancreas: Pancreas is normal. No ductal dilatation. No pancreatic inflammation.  Spleen: Normal spleen  Adrenals/urinary tract: Adrenal glands normal. Exophytic lesion from the RIGHT kidney measures 2.9 cm has simple fluid attenuation on postcontrast imaging. Peripelvic cysts on the LEFT. Ureters and bladder normal.  Stomach/Bowel: Stomach, duodenum and small-bowel normal. Subtle haziness within the central mesentery not changed significantly from comparison exam. Terminal ileum normal. Appendix not identified. The colon and rectosigmoid colon are normal.  Vascular/Lymphatic: Abdominal aorta is normal caliber with atherosclerotic calcification. There is no retroperitoneal or periportal lymphadenopathy. No pelvic lymphadenopathy.  Reproductive: Prostate unremarkable  Other: No free fluid.  Musculoskeletal: No aggressive osseous lesion.  IMPRESSION: 1. No  acute findings in the abdomen pelvis. 2. Cholelithiasis without evidence cholecystitis. 3. RIGHT Bosniak I benign renal cyst measuring 2.9 cm. No follow-up imaging is recommended. JACR 2018 Feb;  [id number], Management of the Incidental Renal Mass on CT, [employer] 2021; [phone], Bosniak Classification of Cystic Renal Masses, Version 2019. 4.  Aortic Atherosclerosis (ICD10-I70.0).  Electronically Signed   By: Jackquline Boxer M.D.   On: 01/18/2024 13:36 Note: Reviewed        Physical Exam  Vitals: There were no vitals taken for this visit. BMI: Estimated body mass index is 31.57 kg/m as calculated from the following:   Height as of 05/07/24: 5' 10 (1.778 m).   Weight as of 05/07/24: 220 lb (99.8 kg). Ideal: Patient weight not recorded General appearance: Well nourished, well developed, and well hydrated. In no apparent acute distress Mental status: Alert, oriented x 3 (person, place, & time)       Respiratory: No evidence of acute respiratory distress Eyes: PERLA   Assessment   Diagnosis Status  1. Chronic hip pain (Left)   2. Osteoarthritis of hip (Left)   3. Other bursitis of hip (Left)   4. Enthesopathy of hip region (Left)    Controlled Controlled Controlled   Updated Problems: No problems updated.  Plan of Care  Problem-specific:  Assessment and Plan            Mr. CALLAGHAN LAVERDURE has a current medication list which includes the following long-term medication(s): amlodipine , atorvastatin , gabapentin , hypertonic nasal wash, ipratropium, potassium chloride , and valsartan -hydrochlorothiazide .  Pharmacotherapy (Medications Ordered): No orders of the defined types were placed in this encounter.  Orders:  No orders of the defined types were placed in this encounter.    Interventional Therapies  Risk Factors  Considerations  Medical Comorbidities:  Plavix  Anticoagulation: (Stop: 7-10 days  Restart: 2 hours)  TIAs  HTN  T2NIDDM  (B) Carotid Stenosis  RBBB    Planned  Pending:   Therapeutic right L4 TFESI #2    Under consideration:   Therapeutic right L4 TFESI #2    Completed:   Diagnostic/therapeutic right L4 TFESI x1 (04/01/2022) (100/100/100/100)  Diagnostic/therapeutic left IA hip joint injection x2 (12/01/2023) (100/100/100/90-100)    Therapeutic  Palliative (PRN) options:   Palliative/therapeutic left IA hip joint injection #3  Palliative right L4-5 LESI (none done by me)   Completed by other providers:   The patient indicated having had LESI x 4, by Dr. Dannial. (before 2000) (Procedure described as helpful).       No follow-ups on file.    Recent Visits Date Type Provider Dept  05/07/24 Office Visit Tanya Glisson, MD [facility]  Showing recent visits within past 90 days and meeting all other requirements Future Appointments Date Type Provider Dept  06/04/24 Appointment Tanya Glisson, MD [facility]  Showing future appointments within next 90 days and meeting all other requirements  I discussed the assessment and treatment plan with the patient. The patient was provided an opportunity to ask questions and all were answered. The patient agreed with the plan and demonstrated an understanding of the instructions.  Patient advised to call back or seek an in-person evaluation if the symptoms or condition worsens.  Duration of encounter: *** minutes.  Total time on encounter, as per AMA guidelines included both the face-to-face and non-face-to-face time personally spent by the physician and/or other qualified health care professional(s) on the day of the encounter (includes time in activities that require the physician or other qualified health care professional and does not include time in activities normally performed by clinical staff). Physician's time may include the following activities when performed: Preparing to see the patient (  e.g., pre-charting review of records, searching for previously  ordered imaging, lab work, and nerve conduction tests) Review of prior analgesic pharmacotherapies. Reviewing PMP Interpreting ordered tests (e.g., lab work, imaging, nerve conduction tests) Performing post-procedure evaluations, including interpretation of diagnostic procedures Obtaining and/or reviewing separately obtained history Performing a medically appropriate examination and/or evaluation Counseling and educating the patient/family/caregiver Ordering medications, tests, or procedures Referring and communicating with other health care professionals (when not separately reported) Documenting clinical information in the electronic or other health record Independently interpreting results (not separately reported) and communicating results to the patient/ family/caregiver Care coordination (not separately reported)  Note by: Eric DELENA Como, MD (TTS and AI technology used. I apologize for any typographical errors that were not detected and corrected.) Date: 06/04/2024; Time: 8:16 AM

## 2024-06-04 ENCOUNTER — Ambulatory Visit: Attending: Pain Medicine | Admitting: Pain Medicine

## 2024-06-04 ENCOUNTER — Encounter: Payer: Self-pay | Admitting: Pain Medicine

## 2024-06-04 VITALS — BP 124/67 | HR 71 | Temp 97.2°F | Resp 18 | Ht 70.0 in | Wt 220.0 lb

## 2024-06-04 DIAGNOSIS — Z7901 Long term (current) use of anticoagulants: Secondary | ICD-10-CM | POA: Diagnosis present

## 2024-06-04 DIAGNOSIS — M7072 Other bursitis of hip, left hip: Secondary | ICD-10-CM | POA: Diagnosis not present

## 2024-06-04 DIAGNOSIS — M76892 Other specified enthesopathies of left lower limb, excluding foot: Secondary | ICD-10-CM | POA: Diagnosis present

## 2024-06-04 DIAGNOSIS — G8929 Other chronic pain: Secondary | ICD-10-CM | POA: Diagnosis present

## 2024-06-04 DIAGNOSIS — M1612 Unilateral primary osteoarthritis, left hip: Secondary | ICD-10-CM | POA: Insufficient documentation

## 2024-06-04 DIAGNOSIS — M25551 Pain in right hip: Secondary | ICD-10-CM | POA: Insufficient documentation

## 2024-06-04 DIAGNOSIS — M25552 Pain in left hip: Secondary | ICD-10-CM | POA: Diagnosis not present

## 2024-06-04 MED ORDER — PREDNISONE 20 MG PO TABS: ORAL_TABLET | ORAL | 0 refills | Status: AC

## 2024-06-04 NOTE — Patient Instructions (Signed)
  ______________________________________________________________________    Steroid injections  Common steroids for injections Triamcinolone : Used by many sports medicine physicians for large joint and bursal injections, often combined with a local anesthetic like lidocaine . A study focusing on coccydynia (tailbone pain) found triamcinolone  was more effective than betamethasone, suggesting it may also be preferable for other localized inflammation conditions. Methylprednisolone : A common alternative to triamcinolone  that is also a strong anti-inflammatory. It is available in different formulations, with the acetate suspension being the long-acting option for intra-articular injections. Dexamethasone : This is a non-particulate steroid, meaning it has a lower risk of tissue damage compared to particulate steroids like triamcinolone  and methylprednisolone . While less common for this specific use, it is an option for targeted injections.   Considerations for physicians Particulate vs. non-particulate steroids: Triamcinolone  and methylprednisolone  are particulate, meaning they can clump together. Dexamethasone  is non-particulate. Particulate steroids are often preferred for their longer-lasting effects but carry a theoretical higher risk for certain injections (though this is less of a concern in the costochondral joints). Combined injectate: Corticosteroids are typically mixed with a local anesthetic like lidocaine  to provide both immediate pain relief (from the anesthetic) and longer-term inflammation reduction (from the steroid). Imaging guidance: To ensure accurate placement of the needle and medication, physicians may use ultrasound or fluoroscopic guidance for the injection, especially in complex or refractory cases.   Patient guidance Before undergoing a steroid injection, discuss the options with your physician. They will determine the best steroid, dosage, and procedure for your specific case  based on factors like: Severity of your condition History of response to other treatments Your overall health status Experience and preference of the physician  Last  Updated: 05/22/2024 ______________________________________________________________________

## 2024-06-04 NOTE — Progress Notes (Signed)
 Safety precautions to be maintained throughout the outpatient stay will include: orient to surroundings, keep bed in low position, maintain call bell within reach at all times, provide assistance with transfer out of bed and ambulation.

## 2024-06-13 ENCOUNTER — Ambulatory Visit: Payer: Medicare PPO | Admitting: Pain Medicine

## 2024-06-17 NOTE — Progress Notes (Unsigned)
 PROVIDER NOTE: Interpretation of information contained herein should be left to medically-trained personnel. Specific patient instructions are provided elsewhere under Patient Instructions section of medical record. This document was created in part using AI and STT-dictation technology, any transcriptional errors that may result from this process are unintentional.  Patient: Brandon Bright  Service: E/M   PCP: Cyrus Selinda Moose, PA-C  DOB: 04-24-1946  DOS: 06/18/2024  Provider: Eric DELENA Como, MD  MRN: [id number]  Delivery: Face-to-face  Specialty: Interventional Pain Management  Type: Established Patient  Setting: Ambulatory outpatient facility  Specialty designation: 09  Referring Prov.: Cyrus Selinda Moose,*  Location: Outpatient office facility       History of present illness (HPI) [name], a 78 y.o. year old male, is here today because of his Chronic hip pain, left [M25.552, G89.29]. Brandon Bright primary complain today is No chief complaint on file.  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  Pain Assessment: Severity of   is reported as a  /10. Location:    / . Onset:  . Quality:  . Timing:  . Modifying factor(s):  SABRA Vitals:  vitals were not taken for this visit.  BMI: Estimated body mass index is 31.57 kg/m as calculated from the following:   Height as of 06/04/24: 5' 10 (1.778 m).   Weight as of 06/04/24: 220 lb (99.8 kg).  Last encounter: 06/04/2024. Last procedure: 12/01/2023.  Reason for encounter: follow-up evaluation after steroid tapper.   Discussed the use of AI scribe software for clinical note transcription with the patient, who gave verbal consent to proceed.  History of Present Illness           Pharmacotherapy Assessment   Opioid Analgesic:  No opioid analgesics prescribed by our practice.  Monitoring: [place] PMP: PDMP reviewed during this encounter.       Pharmacotherapy: No side-effects or adverse reactions reported. Compliance: No problems identified. Effectiveness: Clinically acceptable.  No notes on file  UDS:  No results found for: SUMMARY  No results found for: CBDTHCR No results found for: D8THCCBX No results found for: D9THCCBX  ROS  Constitutional: Denies any fever or chills Gastrointestinal: No reported hemesis, hematochezia, vomiting, or acute GI distress Musculoskeletal: Denies any acute onset joint swelling, redness, loss of ROM, or weakness Neurological: No reported episodes of acute onset apraxia, aphasia, dysarthria, agnosia, amnesia, paralysis, loss of coordination, or loss of consciousness  Medication Review  Centrum Silver  50+Men, Hypertonic Nasal Wash, Multiple Vitamins-Minerals, amLODipine , atorvastatin , cholecalciferol, clopidogrel , gabapentin , ipratropium, latanoprost, potassium chloride , and valsartan -hydrochlorothiazide   History Review  Allergy: Brandon Bright is allergic to codeine, vicodin [hydrocodone-acetaminophen], celebrex [celecoxib], and ultram [tramadol]. Drug: Mr.  Bright  reports no history of drug use. Alcohol:  reports  no history of alcohol use. Tobacco:  reports that he has never smoked. He has never used smokeless tobacco. Social: Brandon Bright  reports that he has never smoked. He has never used smokeless tobacco. He reports that he does not drink alcohol and does not use drugs. 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 Surgical: Brandon Bright  has a past surgical history that includes ganglion cyst removal (Right); Cataract extraction w/PHACO (Left, 02/04/2016); Colonoscopy; Pilonidal cyst excision; Cardiac catheterization (1999?); Cataract extraction w/PHACO (Right, 03/03/2016); Eye surgery (May and June); Colonoscopy with propofol  (N/A, 06/05/2018); Colonoscopy with propofol  (N/A, 03/21/2023); biopsy (03/21/2023); and polypectomy (03/21/2023). Family: family history includes Dementia in his mother; Diabetes in his brother; Heart disease in his brother, brother, and father.  Laboratory Chemistry Profile   Renal Lab Results  Component Value Date   BUN 27 (H) 04/29/2023   CREATININE 0.96 04/29/2023   GFRAA >60 08/17/2014   GFRNONAA >60 04/29/2023    Hepatic Lab Results  Component Value Date   AST 21 04/28/2023   ALT 24 04/28/2023   ALBUMIN 3.8 04/28/2023   ALKPHOS 66 04/28/2023   LIPASE 75 08/16/2014    Electrolytes Lab Results  Component Value Date   NA 141 04/29/2023   K 3.6 04/29/2023   CL 105 04/29/2023   CALCIUM  9.6 04/29/2023   MG 2.2 04/28/2023    Bone No results found for: VD25OH, VD125OH2TOT, CI6874NY7, CI7874NY7, 25OHVITD1, 25OHVITD2, 25OHVITD3, TESTOFREE, TESTOSTERONE  Inflammation (CRP: Acute Phase) (ESR:  Chronic Phase) Lab Results  Component Value Date   CRP 2 08/28/2020   ESRSEDRATE 26 08/28/2020         Note: Above Lab results reviewed.  Recent Imaging Review  CT ABDOMEN PELVIS W CONTRAST CLINICAL DATA:  Poor appetite.  Weight loss.  Diarrhea.  EXAM: CT ABDOMEN AND PELVIS WITH CONTRAST  TECHNIQUE: Multidetector CT imaging of the abdomen and pelvis was performed using the standard protocol following bolus administration of intravenous contrast.  RADIATION DOSE REDUCTION: This exam was performed according to the departmental dose-optimization program which includes automated exposure control, adjustment of the mA and/or kV according to patient size and/or use of iterative reconstruction technique.  CONTRAST:  OMNIPAQUE  IOHEXOL  300 MG/ML  SOLN  COMPARISON:  CT 06/01/2018  FINDINGS: Lower chest: Lung bases are clear.  Hepatobiliary: No focal hepatic lesion. Multiple dense 1 cm gallstones. No gallbladder inflammation. No biliary duct dilatation. No biliary duct dilatation. Common bile duct is normal.  Pancreas: Pancreas is normal. No ductal dilatation. No pancreatic inflammation.  Spleen: Normal spleen  Adrenals/urinary tract: Adrenal glands normal. Exophytic lesion from the RIGHT kidney measures 2.9 cm has simple fluid attenuation on postcontrast imaging. Peripelvic cysts on the LEFT. Ureters and bladder normal.  Stomach/Bowel: Stomach, duodenum and small-bowel normal. Subtle haziness within the central mesentery not changed significantly from comparison exam. Terminal ileum normal. Appendix not identified. The colon and rectosigmoid colon are normal.  Vascular/Lymphatic: Abdominal aorta is normal caliber with atherosclerotic calcification. There is no retroperitoneal or periportal lymphadenopathy. No pelvic lymphadenopathy.  Reproductive: Prostate unremarkable  Other: No free fluid.  Musculoskeletal: No aggressive osseous lesion.  IMPRESSION: 1. No  acute findings in the abdomen pelvis. 2. Cholelithiasis without evidence cholecystitis. 3. RIGHT Bosniak I benign renal cyst measuring 2.9 cm. No follow-up imaging is recommended. JACR 2018  Feb; 264-273, Management of the Incidental Renal Mass on CT, [employer] 2021; 814-848, Bosniak Classification of Cystic Renal Masses, Version 2019. 4.  Aortic Atherosclerosis (ICD10-I70.0).  Electronically Signed   By: Jackquline Boxer M.D.   On: 01/18/2024 13:36 Note: Reviewed        Physical Exam  Vitals: There were no vitals taken for this visit. BMI: Estimated body mass index is 31.57 kg/m as calculated from the following:   Height as of 06/04/24: 5' 10 (1.778 m).   Weight as of 06/04/24: 220 lb (99.8 kg). Ideal: Ideal body weight: 73 kg (160 lb 15 oz) Adjusted ideal body weight: 83.7 kg (184 lb 9 oz) General appearance: Well nourished, well developed, and well hydrated. In no apparent acute distress Mental status: Alert, oriented x 3 (person, place, & time)       Respiratory: No evidence of acute respiratory distress Eyes: PERLA   Assessment   Diagnosis Status  1. Chronic hip pain (Left)   2. Enthesopathy of hip region (Left)   3. Other bursitis of hip (Left)   4. Chronic lower extremity pain (1ry area of Pain) (Right)    Controlled Controlled Controlled   Updated Problems: No problems updated.  Plan of Care  Problem-specific:  Assessment and Plan            Brandon Bright has a current medication list which includes the following long-term medication(s): amlodipine , atorvastatin , gabapentin , hypertonic nasal wash, ipratropium, potassium chloride , and valsartan -hydrochlorothiazide .  Pharmacotherapy (Medications Ordered): No orders of the defined types were placed in this encounter.  Orders:  No orders of the defined types were placed in this encounter.    Interventional Therapies  Risk Factors  Considerations  Medical Comorbidities:  Plavix  Anticoagulation:  (Stop: 7-10 days  Restart: 2 hours)  TIAs  HTN  T2NIDDM  (B) Carotid Stenosis  RBBB   Planned  Pending:      Under consideration:   Therapeutic right L4 TFESI #2    Completed:   Diagnostic/therapeutic right L4 TFESI x1 (04/01/2022) (100/100/100/100)  Diagnostic/therapeutic left IA hip joint injection x2 (12/01/2023) (100/100/100/90-100)    Therapeutic  Palliative (PRN) options:   Palliative/therapeutic left IA hip joint injection #3  Palliative right L4-5 LESI (none done by me)   Completed by other providers:   The patient indicated having had LESI x 4, by Dr. Dannial. (before 2000) (Procedure described as helpful).       No follow-ups on file.    Recent Visits Date Type Provider Dept  06/04/24 Office Visit Tanya Glisson, MD [facility]  05/07/24 Office Visit Tanya Glisson, MD [facility]  Showing recent visits within past 90 days and meeting all other requirements Future Appointments Date Type Provider Dept  06/18/24 Appointment Tanya Glisson, MD [facility]  Showing future appointments within next 90 days and meeting all other requirements  I discussed the assessment and treatment plan with the patient. The patient was provided an opportunity to ask questions and all were answered. The patient agreed with the plan and demonstrated an understanding of the instructions.  Patient advised to call back or seek an in-person evaluation if the symptoms or condition worsens.  Duration of encounter: *** minutes.  Total time on encounter, as per AMA guidelines included both the face-to-face and non-face-to-face time personally spent by the physician and/or other qualified health care professional(s) on the day of the encounter (includes time in activities that require the physician or other qualified health care professional  and does not include time in activities normally performed by clinical staff). Physician's time may include the  following activities when performed: Preparing to see the patient (e.g., pre-charting review of records, searching for previously ordered imaging, lab work, and nerve conduction tests) Review of prior analgesic pharmacotherapies. Reviewing PMP Interpreting ordered tests (e.g., lab work, imaging, nerve conduction tests) Performing post-procedure evaluations, including interpretation of diagnostic procedures Obtaining and/or reviewing separately obtained history Performing a medically appropriate examination and/or evaluation Counseling and educating the patient/family/caregiver Ordering medications, tests, or procedures Referring and communicating with other health care professionals (when not separately reported) Documenting clinical information in the electronic or other health record Independently interpreting results (not separately reported) and communicating results to the patient/ family/caregiver Care coordination (not separately reported)  Note by: Eric DELENA Como, MD (TTS and AI technology used. I apologize for any typographical errors that were not detected and corrected.) Date: 06/18/2024; Time: 2:24 PM

## 2024-06-18 ENCOUNTER — Ambulatory Visit: Attending: Pain Medicine | Admitting: Pain Medicine

## 2024-06-18 ENCOUNTER — Encounter: Payer: Self-pay | Admitting: Pain Medicine

## 2024-06-18 VITALS — BP 124/50 | HR 78 | Temp 97.3°F | Ht 70.0 in | Wt 220.0 lb

## 2024-06-18 DIAGNOSIS — M79604 Pain in right leg: Secondary | ICD-10-CM | POA: Diagnosis present

## 2024-06-18 DIAGNOSIS — M76892 Other specified enthesopathies of left lower limb, excluding foot: Secondary | ICD-10-CM | POA: Insufficient documentation

## 2024-06-18 DIAGNOSIS — M25552 Pain in left hip: Secondary | ICD-10-CM | POA: Insufficient documentation

## 2024-06-18 DIAGNOSIS — G8929 Other chronic pain: Secondary | ICD-10-CM | POA: Insufficient documentation

## 2024-06-18 DIAGNOSIS — M7072 Other bursitis of hip, left hip: Secondary | ICD-10-CM | POA: Insufficient documentation

## 2024-06-24 NOTE — Progress Notes (Unsigned)
 PROVIDER NOTE: Interpretation of information contained herein should be left to medically-trained personnel. Specific patient instructions are provided elsewhere under Patient Instructions section of medical record. This document was created in part using AI and STT-dictation technology, any transcriptional errors that may result from this process are unintentional.  Patient: Brandon Bright  Service: E/M   PCP: Cyrus Selinda Moose, PA-C  DOB: November 09, 1945  DOS: 06/25/2024  Provider: Eric DELENA Como, MD  MRN: [id number]  Delivery: Face-to-face  Specialty: Interventional Pain Management  Type: Established Patient  Setting: Ambulatory outpatient facility  Specialty designation: 09  Referring Prov.: Cyrus Selinda Moose,*  Location: Outpatient office facility       History of present illness (HPI) Brandon Bright, a 78 y.o. year old male, is here today because of his Chronic pain of right lower extremity [M79.604, G89.29]. Mr. Coach primary complain today is No chief complaint on file.  Pertinent problems: Mr. Torok has DDD (degenerative disc disease), cervical; DDD (degenerative disc disease), lumbar; Lumbar facet syndrome; Cervical facet syndrome; Degenerative arthritis of spine; Tendinitis of wrist; Chronic lower extremity pain (1ry area of Pain) (Right); Chronic pain syndrome; Lumbar spondylosis; Chronic midline low back pain (2ry area of Pain) w/ sciatica (Right); Lumbar discogenic pain syndrome; Chronic neck pain (3ry area of Pain) (Left); Lumbar radiculitis (Right) (L3 & L4); Lumbar foraminal stenosis (Left: L1-2, L2-3, L5-S1) (Right: L3-4, L4-5) (SEVERE: L4-5); Lumbar disc protrusion (Left: L2-3) (Central: L5-S1); Lumbar facet arthropathy (Bilateral); Cervicalgia; Chronic hip pain (Left); Arthralgia of hip (Left); Osteoarthritis of hip (Left); Enthesopathy of hip region (Left); Other bursitis of hip (Left); Lumbosacral radiculopathy at L4 (Right); Abnormal MRI, lumbar spine  (03/22/2022); Chronic pain disorder; and Chronic hip pain (Right) on their pertinent problem list.  Pain Assessment: Severity of   is reported as a  /10. Location:    / . Onset:  . Quality:  . Timing:  . Modifying factor(s):  SABRA Vitals:  vitals were not taken for this visit.  BMI: Estimated body mass index is 31.57 kg/m as calculated from the following:   Height as of 06/18/24: 5' 10 (1.778 m).   Weight as of 06/18/24: 220 lb (99.8 kg).  Last encounter: 06/18/2024. Last procedure: 12/01/2023.  Reason for encounter: evaluation of worsening, or previously known (established) problem.   Discussed the use of AI scribe software for clinical note transcription with the patient, who gave verbal consent to proceed.  History of Present Illness           Pharmacotherapy Assessment   Opioid Analgesic:  No opioid analgesics prescribed by our practice.  Monitoring: [place] PMP: PDMP reviewed during this encounter.       Pharmacotherapy: No side-effects or adverse reactions reported. Compliance: No problems identified. Effectiveness: Clinically acceptable.  No notes on file  UDS:  No results found for: SUMMARY  No results found for: CBDTHCR No results found for: D8THCCBX No results found for: D9THCCBX  ROS  Constitutional: Denies any fever or chills Gastrointestinal: No reported hemesis, hematochezia, vomiting, or acute GI distress Musculoskeletal: Denies any acute onset joint swelling, redness, loss of ROM, or weakness Neurological: No reported episodes of acute onset apraxia, aphasia, dysarthria, agnosia, amnesia, paralysis, loss of coordination, or loss of consciousness  Medication Review  Centrum Silver  50+Men, Hypertonic Nasal Wash, Multiple Vitamins-Minerals, amLODipine , atorvastatin , cholecalciferol, clopidogrel , gabapentin , ipratropium, latanoprost, potassium chloride , and valsartan -hydrochlorothiazide   History Review  Allergy: Brandon Bright is allergic to codeine, vicodin  [hydrocodone-acetaminophen], celebrex [celecoxib], and ultram [tramadol]. Drug: Brandon Bright  reports no history of drug use. Alcohol:  reports no history of alcohol use. Tobacco:  reports that he has never smoked. He has never used smokeless tobacco. Social: Mr. [name]  reports that he has never smoked. He has never used smokeless tobacco. He reports that he does not drink alcohol and does not use drugs. 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 Surgical: Brandon Bright  has a past surgical history that includes ganglion cyst removal (Right); Cataract extraction w/PHACO (Left, 02/04/2016); Colonoscopy; Pilonidal cyst excision; Cardiac catheterization (1999?); Cataract extraction w/PHACO (Right, 03/03/2016); Eye surgery (May and June); Colonoscopy with propofol  (N/A, 06/05/2018); Colonoscopy with propofol  (N/A, 03/21/2023); biopsy (03/21/2023); and polypectomy (03/21/2023). Family: family history includes Dementia in his mother; Diabetes in his brother; Heart disease in his brother, brother, and father.  Laboratory Chemistry Profile   Renal Lab Results  Component Value Date   BUN 27 (H) 04/29/2023   CREATININE 0.96 04/29/2023   GFRAA >60 08/17/2014   GFRNONAA >60 04/29/2023    Hepatic Lab Results  Component Value Date   AST 21 04/28/2023   ALT 24 04/28/2023   ALBUMIN 3.8 04/28/2023   ALKPHOS 66 04/28/2023   LIPASE 75 08/16/2014    Electrolytes Lab Results  Component Value Date   NA 141 04/29/2023   K 3.6 04/29/2023   CL 105 04/29/2023   CALCIUM  9.6 04/29/2023   MG 2.2 04/28/2023    Bone No results found for: VD25OH, VD125OH2TOT, CI6874NY7, CI7874NY7, 25OHVITD1, 25OHVITD2, 25OHVITD3,  TESTOFREE, TESTOSTERONE  Inflammation (CRP: Acute Phase) (ESR: Chronic Phase) Lab Results  Component Value Date   CRP 2 08/28/2020   ESRSEDRATE 26 08/28/2020         Note: Above Lab results reviewed.  Recent Imaging Review  CT ABDOMEN PELVIS W CONTRAST CLINICAL DATA:  Poor appetite.  Weight loss.  Diarrhea.  EXAM: CT ABDOMEN AND PELVIS WITH CONTRAST  TECHNIQUE: Multidetector CT imaging of the abdomen and pelvis was performed using the standard protocol following bolus administration of intravenous contrast.  RADIATION DOSE REDUCTION: This exam was performed according to the departmental dose-optimization program which includes automated exposure control, adjustment of the mA and/or kV according to patient size and/or use of iterative reconstruction technique.  CONTRAST:  OMNIPAQUE  IOHEXOL  300 MG/ML  SOLN  COMPARISON:  CT 06/01/2018  FINDINGS: Lower chest: Lung bases are clear.  Hepatobiliary: No focal hepatic lesion. Multiple dense 1 cm gallstones. No gallbladder inflammation. No biliary duct dilatation. No biliary duct dilatation. Common bile duct is normal.  Pancreas: Pancreas is normal. No ductal dilatation. No pancreatic inflammation.  Spleen: Normal spleen  Adrenals/urinary tract: Adrenal glands normal. Exophytic lesion from the RIGHT kidney measures 2.9 cm has simple fluid attenuation on postcontrast imaging. Peripelvic cysts on the LEFT. Ureters and bladder normal.  Stomach/Bowel: Stomach, duodenum and small-bowel normal. Subtle haziness within the central mesentery not changed significantly from comparison exam. Terminal ileum normal. Appendix not identified. The colon and rectosigmoid colon are normal.  Vascular/Lymphatic: Abdominal aorta is normal caliber with atherosclerotic calcification. There is no retroperitoneal or periportal lymphadenopathy. No pelvic lymphadenopathy.  Reproductive: Prostate unremarkable  Other: No free  fluid.  Musculoskeletal: No aggressive osseous lesion.  IMPRESSION: 1. No acute findings in the abdomen pelvis. 2. Cholelithiasis without evidence cholecystitis. 3. RIGHT Bosniak I benign renal cyst measuring 2.9 cm.  No follow-up imaging is recommended. JACR 2018 Feb; 264-273, Management of the Incidental Renal Mass on CT, [employer] 2021; 814-848, Bosniak Classification of Cystic Renal Masses, Version 2019. 4.  Aortic Atherosclerosis (ICD10-I70.0).  Electronically Signed   By: Jackquline Boxer M.D.   On: 01/18/2024 13:36 Note: Reviewed        Physical Exam  Vitals: There were no vitals taken for this visit. BMI: Estimated body mass index is 31.57 kg/m as calculated from the following:   Height as of 06/18/24: 5' 10 (1.778 m).   Weight as of 06/18/24: 220 lb (99.8 kg). Ideal: Ideal body weight: 73 kg (160 lb 15 oz) Adjusted ideal body weight: 83.7 kg (184 lb 9 oz) General appearance: Well nourished, well developed, and well hydrated. In no apparent acute distress Mental status: Alert, oriented x 3 (person, place, & time)       Respiratory: No evidence of acute respiratory distress Eyes: PERLA   Assessment   Diagnosis Status  1. Chronic lower extremity pain (1ry area of Pain) (Right)   2. Chronic midline low back pain (2ry area of Pain) w/ sciatica (Right)   3. Chronic hip pain (Right)   4. Lumbar disc protrusion (Left: L2-3) (Central: L5-S1)   5. 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 Lumbar radiculitis (Right) (L3 & L4)   7. Lumbosacral radiculopathy at L4 (Right)   8. Lumbar discogenic pain syndrome   9. Degeneration of intervertebral disc of lumbar region with lower extremity pain   10. Abnormal MRI, lumbar spine (03/22/2022)   11. 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 Mild left neural foraminal stenosis. L2-3: Mild left neural foraminal stenosis. L3-4: Right lateral recess narrowing. Moderate right neural foraminal stenosis. L4-5: Worsened severe right neural foraminal stenosis. L5-S1: Worsened Left lateral recess narrowing. Mild left neural foraminal stenosis.  IMPRESSION: 1. Worsened severe right L4-5 neural foraminal stenosis. 2. Mild L2-3 spinal canal stenosis and moderate right L3-4 foraminal stenosis.     Plan of Care  Problem-specific:  Assessment and Plan            Mr. GODWIN TEDESCO has a current medication list which includes the following long-term medication(s): amlodipine , atorvastatin , gabapentin , hypertonic nasal wash, ipratropium, potassium chloride , and valsartan -hydrochlorothiazide .  Pharmacotherapy (Medications Ordered): No orders of the defined types were placed in this encounter.  Orders:  No orders of the defined types were placed in this encounter.    Interventional Therapies  Risk Factors  Considerations  Medical Comorbidities:  Plavix  Anticoagulation: (Stop: 7-10 days  Restart: 2 hours)  TIAs  HTN  T2NIDDM  (B) Carotid Stenosis  RBBB   Planned  Pending:   Therapeutic right L4 TFESI #2    Under consideration:   Therapeutic right L4 TFESI #2    Completed:   Diagnostic/therapeutic right L4 TFESI x1 (04/01/2022) (100/100/100/100)  Diagnostic/therapeutic left IA hip joint injection x2 (12/01/2023) (100/100/100/90-100)    Therapeutic  Palliative (PRN) options:   Palliative/therapeutic left IA hip joint injection #3  Palliative right L4-5 LESI (none done by me)   Completed by other providers:   The patient indicated having had LESI x 4, by Dr. Dannial. (before 2000) (Procedure described as helpful).       No follow-ups on  file.    Recent Visits Date Type Provider Dept  06/18/24 Office Visit Tanya Glisson, MD [facility]  06/04/24 Office Visit Tanya Glisson, MD [facility]  05/07/24 Office Visit Tanya Glisson, MD [facility]  Showing recent visits within past 90 days and meeting all other requirements Future Appointments Date Type Provider Dept  06/25/24 Appointment Tanya Glisson, MD [facility]  Showing future appointments within next 90 days and meeting all other requirements  I discussed the assessment and treatment plan with the patient. The patient was provided an opportunity to ask questions and all were answered. The patient agreed with the plan and demonstrated an understanding of the instructions.  Patient advised to call back or seek an in-person evaluation if the symptoms or condition worsens.  Duration of encounter: *** minutes.  Total time on encounter, as per AMA guidelines included both the face-to-face and non-face-to-face time personally spent by the physician and/or other qualified health care professional(s) on the day of the encounter (includes time in activities that require the physician or other qualified health care professional and does not include time in activities normally performed by clinical staff). Physician's time may include the following activities when performed: Preparing to see the patient (e.g., pre-charting review of records, searching for previously ordered imaging, lab work, and nerve conduction tests) Review of prior analgesic pharmacotherapies. Reviewing PMP Interpreting ordered tests (e.g., lab work, imaging, nerve conduction tests) Performing post-procedure evaluations, including interpretation of diagnostic procedures Obtaining and/or reviewing separately obtained history Performing a medically appropriate examination and/or evaluation Counseling and educating the patient/family/caregiver Ordering medications, tests, or procedures Referring and communicating with other health care  professionals (when not separately reported) Documenting clinical information in the electronic or other health record Independently interpreting results (not separately reported) and communicating results to the patient/ family/caregiver Care coordination (not separately reported)  Note by: Glisson DELENA Tanya, MD (TTS and AI technology used. I apologize for any typographical errors that were not detected and corrected.) Date: 06/25/2024; Time: 8:49 AM

## 2024-06-24 NOTE — Patient Instructions (Signed)
 ______________________________________________________________________    Blood Thinners  IMPORTANT NOTICE:  If you take any of these, make sure to notify the nursing staff.  Failure to do so may result in serious injury.  Recommended time intervals to stop and restart blood-thinners, before & after invasive procedures  Generic Name Brand Name Pre-procedure: Stop medication for this amount of time before your procedure: Post-procedure: Wait this amount of time after the procedure before restarting your medication:  Abciximab Reopro 15 days 2 hrs  Alteplase Activase 10 days 10 days  Anagrelide Agrylin    Apixaban  Eliquis  3 days 6 hrs  Cilostazol Pletal 3 days 5 hrs  Clopidogrel Plavix 7-10 days 2 hrs  Dabigatran Pradaxa 5 days 6 hrs  Dalteparin Fragmin 24 hours 4 hrs  Dipyridamole Aggrenox 11days 2 hrs  Edoxaban Lixiana; Savaysa 3 days 2 hrs  Enoxaparin   Lovenox  24 hours 4 hrs  Eptifibatide Integrillin 8 hours 2 hrs  Fondaparinux  Arixtra 72 hours 12 hrs  Hydroxychloroquine Plaquenil 11 days   Prasugrel Effient 7-10 days 6 hrs  Reteplase Retavase 10 days 10 days  Rivaroxaban Xarelto 3 days 6 hrs  Ticagrelor Brilinta 5-7 days 6 hrs  Ticlopidine Ticlid 10-14 days 2 hrs  Tinzaparin Innohep 24 hours 4 hrs  Tirofiban Aggrastat 8 hours 2 hrs  Warfarin Coumadin 5 days 2 hrs   Other medications with blood-thinning effects  NOTE: Consider stopping these if you have prolonged bleeding despite not taking any of the above blood thinners. Otherwise ask your provider and this will be decided on a case-by-case basis.  Product indications Generic (Brand) names Note  Cholesterol Lipitor Stop 4 days before procedure  Blood thinner (injectable) Heparin  (LMW or LMWH Heparin ) Stop 24 hours before procedure  Cancer Ibrutinib (Imbruvica) Stop 7 days before procedure  Malaria/Rheumatoid Hydroxychloroquine (Plaquenil) Stop 11 days before procedure  Thrombolytics  10 days before or after procedures    Over-the-counter (OTC) Products with blood-thinning effects  NOTE: Consider stopping these if you have prolonged bleeding despite not taking any of the above blood thinners. Otherwise ask your provider and this will be decided on a case-by-case basis.  Product Common names Stop Time  Aspirin > 325 mg Goody Powders, Excedrin, etc. 11 days  Aspirin <= 81 mg  7 days  Fish oil  4 days  Garlic supplements  7 days  Ginkgo biloba  36 hours  Ginseng  24 hours  NSAIDs Ibuprofen , Naprosyn, etc. 3 days  Vitamin E  4 days   ______________________________________________________________________      ______________________________________________________________________    Procedure instructions  Stop blood-thinners  Do not eat or drink fluids (other than water) for 6 hours before your procedure  No water for 2 hours before your procedure  Take your blood pressure medicine with a sip of water  Arrive 30 minutes before your appointment  If sedation is planned, bring suitable driver. [place], [place], & public transportation are NOT APPROVED)  Carefully read the Preparing for your procedure detailed instructions  If you have questions call us  at [phone]  Procedure appointments are for procedures only.   NO medication refills or new problem evaluations will be done on procedure days.   Only the scheduled, pre-approved procedure and side will be done.   ______________________________________________________________________      ______________________________________________________________________    Preparing for your procedure  Appointments: If you think you may not be able to keep your appointment, call 24-48 hours in advance to cancel. We need time to make it available  to others.  Procedure visits are for procedures only. During your procedure appointment there will be: NO Prescription Refills*. NO medication changes or discussions*. NO discussion of disability  issues*. NO unrelated pain problem evaluations*. NO evaluations to order other pain procedures*. *These will be addressed at a separate and distinct evaluation encounter on the provider's evaluation schedule and not during procedure days.  Instructions: Food intake: Avoid eating anything solid for at least 8 hours prior to your procedure. Clear liquid intake: You may take clear liquids such as water up to 2 hours prior to your procedure. (No carbonated drinks. No soda.) Transportation: Unless otherwise stated by your physician, bring a driver. (Driver cannot be a Market researcher, Pharmacist, community, or any other form of public transportation.) Morning Medicines: Except for blood thinners, take all of your other morning medications with a sip of water. Make sure to take your heart and blood pressure medicines. If your blood pressure's lower number is above 100, the case will be rescheduled. Blood thinners: Make sure to stop your blood thinners as instructed.  If you take a blood thinner, but were not instructed to stop it, call our office [phone] and ask to talk to a nurse. Not stopping a blood thinner prior to certain procedures could lead to serious complications. Diabetics on insulin : Notify the staff so that you can be scheduled 1st case in the morning. If your diabetes requires high dose insulin , take only  of your normal insulin  dose the morning of the procedure and notify the staff that you have done so. Preventing infections: Shower with an antibacterial soap the morning of your procedure.  Build-up your immune system: Take 1000 mg of Vitamin C with every meal (3 times a day) the day prior to your procedure. Antibiotics: Inform the nursing staff if you are taking any antibiotics or if you have any conditions that may require antibiotics prior to procedures. (Example: recent joint implants)   Pregnancy: If you are pregnant make sure to notify the nursing staff. Not doing so may result in injury to the fetus,  including death.  Sickness: If you have a cold, fever, or any active infections, call and cancel or reschedule your procedure. Receiving steroids while having an infection may result in complications. Arrival: You must be in the facility at least 30 minutes prior to your scheduled procedure. Tardiness: Your scheduled time is also the cutoff time. If you do not arrive at least 15 minutes prior to your procedure, you will be rescheduled.  Children: Do not bring any children with you. Make arrangements to keep them home. Dress appropriately: There is always a possibility that your clothing may get soiled. Avoid long dresses. Valuables: Do not bring any jewelry or valuables.  Reasons to call and reschedule or cancel your procedure: (Following these recommendations will minimize the risk of a serious complication.) Surgeries: Avoid having procedures within 2 weeks of any surgery. (Avoid for 2 weeks before or after any surgery). Flu Shots: Avoid having procedures within 2 weeks of a flu shots or . (Avoid for 2 weeks before or after immunizations). Barium: Avoid having a procedure within 7-10 days after having had a radiological study involving the use of radiological contrast. (Myelograms, Barium swallow or enema study). Heart attacks: Avoid any elective procedures or surgeries for the initial 6 months after a Myocardial Infarction (Heart Attack). Blood thinners: It is imperative that you stop these medications before procedures. Let us  know if you if you take any blood thinner.  Infection: Avoid  procedures during or within two weeks of an infection (including chest colds or gastrointestinal problems). Symptoms associated with infections include: Localized redness, fever, chills, night sweats or profuse sweating, burning sensation when voiding, cough, congestion, stuffiness, runny nose, sore throat, diarrhea, nausea, vomiting, cold or Flu symptoms, recent or current infections. It is specially important if  the infection is over the area that we intend to treat. Heart and lung problems: Symptoms that may suggest an active cardiopulmonary problem include: cough, chest pain, breathing difficulties or shortness of breath, dizziness, ankle swelling, uncontrolled high or unusually low blood pressure, and/or palpitations. If you are experiencing any of these symptoms, cancel your procedure and contact your primary care physician for an evaluation.  Remember:  Regular Business hours are:  Monday to Thursday 8:00 AM to 4:00 PM  Provider's Schedule: Eric Como, MD:  Procedure days: Tuesday and Thursday 7:30 AM to 4:00 PM  Wallie Sherry, MD:  Procedure days: Monday and Wednesday 7:30 AM to 4:00 PM Last  Updated: 09/06/2023 ______________________________________________________________________      ______________________________________________________________________    General Risks and Possible Complications  Patient Responsibilities: It is important that you read this as it is part of your informed consent. It is our duty to inform you of the risks and possible complications associated with treatments offered to you. It is your responsibility as a patient to read this and to ask questions about anything that is not clear or that you believe was not covered in this document.  Patient's Rights: You have the right to refuse treatment. You also have the right to change your mind, even after initially having agreed to have the treatment done. However, under this last option, if you wait until the last second to change your mind, you may be charged for the materials used up to that point.  Introduction: Medicine is not an Visual merchandiser. Everything in Medicine, including the lack of treatment(s), carries the potential for danger, harm, or loss (which is by definition: Risk). In Medicine, a complication is a secondary problem, condition, or disease that can aggravate an already existing one. All  treatments carry the risk of possible complications. The fact that a side effects or complications occurs, does not imply that the treatment was conducted incorrectly. It must be clearly understood that these can happen even when everything is done following the highest safety standards.  No treatment: You can choose not to proceed with the proposed treatment alternative. The "PRO(s)" would include: avoiding the risk of complications associated with the therapy. The "CON(s)" would include: not getting any of the treatment benefits. These benefits fall under one of three categories: diagnostic; therapeutic; and/or palliative. Diagnostic benefits include: getting information which can ultimately lead to improvement of the disease or symptom(s). Therapeutic benefits are those associated with the successful treatment of the disease. Finally, palliative benefits are those related to the decrease of the primary symptoms, without necessarily curing the condition (example: decreasing the pain from a flare-up of a chronic condition, such as incurable terminal cancer).  General Risks and Complications: These are associated to most interventional treatments. They can occur alone, or in combination. They fall under one of the following six (6) categories: no benefit or worsening of symptoms; bleeding; infection; nerve damage; allergic reactions; and/or death. No benefits or worsening of symptoms: In Medicine there are no guarantees, only probabilities. No healthcare provider can ever guarantee that a medical treatment will work, they can only state the probability that it may. Furthermore, there is always  the possibility that the condition may worsen, either directly, or indirectly, as a consequence of the treatment. Bleeding: This is more common if the patient is taking a blood thinner, either prescription or over the counter (example: Goody Powders, Fish oil, Aspirin, Garlic, etc.), or if suffering a condition  associated with impaired coagulation (example: Hemophilia, cirrhosis of the liver, low platelet counts, etc.). However, even if you do not have one on these, it can still happen. If you have any of these conditions, or take one of these drugs, make sure to notify your treating physician. Infection: This is more common in patients with a compromised immune system, either due to disease (example: diabetes, cancer, human immunodeficiency virus [HIV], etc.), or due to medications or treatments (example: therapies used to treat cancer and rheumatological diseases). However, even if you do not have one on these, it can still happen. If you have any of these conditions, or take one of these drugs, make sure to notify your treating physician. Nerve Damage: This is more common when the treatment is an invasive one, but it can also happen with the use of medications, such as those used in the treatment of cancer. The damage can occur to small secondary nerves, or to large primary ones, such as those in the spinal cord and brain. This damage may be temporary or permanent and it may lead to impairments that can range from temporary numbness to permanent paralysis and/or brain death. Allergic Reactions: Any time a substance or material comes in contact with our body, there is the possibility of an allergic reaction. These can range from a mild skin rash (contact dermatitis) to a severe systemic reaction (anaphylactic reaction), which can result in death. Death: In general, any medical intervention can result in death, most of the time due to an unforeseen complication. ______________________________________________________________________

## 2024-06-25 ENCOUNTER — Ambulatory Visit: Attending: Pain Medicine | Admitting: Pain Medicine

## 2024-06-25 ENCOUNTER — Encounter: Payer: Self-pay | Admitting: Pain Medicine

## 2024-06-25 VITALS — BP 136/66 | HR 68 | Temp 97.5°F | Resp 16 | Ht 70.0 in | Wt 216.0 lb

## 2024-06-25 DIAGNOSIS — M79604 Pain in right leg: Secondary | ICD-10-CM | POA: Diagnosis not present

## 2024-06-25 DIAGNOSIS — R937 Abnormal findings on diagnostic imaging of other parts of musculoskeletal system: Secondary | ICD-10-CM | POA: Insufficient documentation

## 2024-06-25 DIAGNOSIS — M5126 Other intervertebral disc displacement, lumbar region: Secondary | ICD-10-CM | POA: Insufficient documentation

## 2024-06-25 DIAGNOSIS — M5136 Other intervertebral disc degeneration, lumbar region with discogenic back pain only: Secondary | ICD-10-CM | POA: Diagnosis present

## 2024-06-25 DIAGNOSIS — M5416 Radiculopathy, lumbar region: Secondary | ICD-10-CM | POA: Diagnosis present

## 2024-06-25 DIAGNOSIS — M25551 Pain in right hip: Secondary | ICD-10-CM | POA: Insufficient documentation

## 2024-06-25 DIAGNOSIS — M48061 Spinal stenosis, lumbar region without neurogenic claudication: Secondary | ICD-10-CM | POA: Insufficient documentation

## 2024-06-25 DIAGNOSIS — M5441 Lumbago with sciatica, right side: Secondary | ICD-10-CM | POA: Diagnosis present

## 2024-06-25 DIAGNOSIS — G8929 Other chronic pain: Secondary | ICD-10-CM | POA: Diagnosis not present

## 2024-06-25 DIAGNOSIS — Z7901 Long term (current) use of anticoagulants: Secondary | ICD-10-CM | POA: Insufficient documentation

## 2024-06-25 DIAGNOSIS — M51361 Other intervertebral disc degeneration, lumbar region with lower extremity pain only: Secondary | ICD-10-CM | POA: Diagnosis present

## 2024-06-25 DIAGNOSIS — M5417 Radiculopathy, lumbosacral region: Secondary | ICD-10-CM | POA: Insufficient documentation

## 2024-06-25 NOTE — Progress Notes (Signed)
 Safety precautions to be maintained throughout the outpatient stay will include: orient to surroundings, keep bed in low position, maintain call bell within reach at all times, provide assistance with transfer out of bed and ambulation.

## 2024-07-10 ENCOUNTER — Ambulatory Visit
Admission: RE | Admit: 2024-07-10 | Discharge: 2024-07-10 | Disposition: A | Discharge status: Home / Self Care | Source: Ambulatory Visit | Attending: Pain Medicine | Admitting: Pain Medicine

## 2024-07-10 ENCOUNTER — Encounter: Payer: Self-pay | Admitting: Pain Medicine

## 2024-07-10 ENCOUNTER — Ambulatory Visit (HOSPITAL_BASED_OUTPATIENT_CLINIC_OR_DEPARTMENT_OTHER): Admitting: Pain Medicine

## 2024-07-10 VITALS — BP 148/71 | HR 74 | Temp 97.5°F | Resp 20 | Ht 70.0 in | Wt 216.0 lb

## 2024-07-10 DIAGNOSIS — M5416 Radiculopathy, lumbar region: Secondary | ICD-10-CM | POA: Diagnosis present

## 2024-07-10 DIAGNOSIS — M51369 Other intervertebral disc degeneration, lumbar region without mention of lumbar back pain or lower extremity pain: Secondary | ICD-10-CM | POA: Insufficient documentation

## 2024-07-10 DIAGNOSIS — M5417 Radiculopathy, lumbosacral region: Secondary | ICD-10-CM

## 2024-07-10 DIAGNOSIS — M48061 Spinal stenosis, lumbar region without neurogenic claudication: Secondary | ICD-10-CM

## 2024-07-10 DIAGNOSIS — M47816 Spondylosis without myelopathy or radiculopathy, lumbar region: Secondary | ICD-10-CM | POA: Insufficient documentation

## 2024-07-10 DIAGNOSIS — M5126 Other intervertebral disc displacement, lumbar region: Secondary | ICD-10-CM

## 2024-07-10 DIAGNOSIS — M79604 Pain in right leg: Secondary | ICD-10-CM | POA: Diagnosis not present

## 2024-07-10 DIAGNOSIS — M5441 Lumbago with sciatica, right side: Secondary | ICD-10-CM | POA: Diagnosis present

## 2024-07-10 DIAGNOSIS — G8929 Other chronic pain: Secondary | ICD-10-CM | POA: Diagnosis present

## 2024-07-10 DIAGNOSIS — Z7901 Long term (current) use of anticoagulants: Secondary | ICD-10-CM | POA: Insufficient documentation

## 2024-07-10 MED ORDER — MIDAZOLAM HCL 2 MG/2ML IJ SOLN: 0.5000 mg | Freq: Once | INTRAMUSCULAR | Status: DC

## 2024-07-10 MED ORDER — LIDOCAINE HCL 2 % IJ SOLN
20.0000 mL | Freq: Once | INTRAMUSCULAR | Status: AC
  Administered 2024-07-10: 400 mg
  Filled 2024-07-10: qty 20

## 2024-07-10 MED ORDER — TRIAMCINOLONE ACETONIDE 40 MG/ML IJ SUSP
40.0000 mg | Freq: Once | INTRAMUSCULAR | Status: AC
  Administered 2024-07-10: 40 mg
  Filled 2024-07-10: qty 1

## 2024-07-10 MED ORDER — ROPIVACAINE HCL 2 MG/ML IJ SOLN
1.0000 mL | Freq: Once | INTRAMUSCULAR | Status: AC
  Administered 2024-07-10: 1 mL via EPIDURAL

## 2024-07-10 MED ORDER — IOHEXOL 180 MG/ML  SOLN
10.0000 mL | Freq: Once | INTRAMUSCULAR | Status: AC
  Administered 2024-07-10: 10 mL via EPIDURAL
  Filled 2024-07-10: qty 20

## 2024-07-10 MED ORDER — ROPIVACAINE HCL 2 MG/ML IJ SOLN
2.0000 mL | Freq: Once | INTRAMUSCULAR | Status: AC
  Administered 2024-07-10: 2 mL via EPIDURAL
  Filled 2024-07-10: qty 20

## 2024-07-10 MED ORDER — SODIUM CHLORIDE 0.9% FLUSH
2.0000 mL | Freq: Once | INTRAVENOUS | Status: AC
  Administered 2024-07-10: 2 mL

## 2024-07-10 MED ORDER — SODIUM CHLORIDE 0.9% FLUSH
1.0000 mL | Freq: Once | INTRAVENOUS | Status: AC
  Administered 2024-07-10: 1 mL

## 2024-07-10 MED ORDER — DEXAMETHASONE SOD PHOSPHATE PF 10 MG/ML IJ SOLN
10.0000 mg | Freq: Once | INTRAMUSCULAR | Status: AC
  Administered 2024-07-10: 10 mg

## 2024-07-10 MED ORDER — PENTAFLUOROPROP-TETRAFLUOROETH EX AERO: INHALATION_SPRAY | Freq: Once | CUTANEOUS | Status: AC

## 2024-07-10 NOTE — Progress Notes (Signed)
 Safety precautions to be maintained throughout the outpatient stay will include: orient to surroundings, keep bed in low position, maintain call bell within reach at all times, provide assistance with transfer out of bed and ambulation.

## 2024-07-10 NOTE — Patient Instructions (Signed)
 ______________________________________________________________________    Post-Procedure Discharge Instructions  INSTRUCTIONS Apply ice:  Purpose: This will minimize any swelling and discomfort after procedure.  When: Day of procedure, as soon as you get home. How: Fill a plastic sandwich bag with crushed ice. Cover it with a small towel and apply to injection site. How long: (15 min on, 15 min off) Apply for 15 minutes then remove x 15 minutes.  Repeat sequence on day of procedure, until you go to bed. Apply heat:  Purpose: To treat any soreness and discomfort from the procedure. When: Starting the next day after the procedure. How: Apply heat to procedure site starting the day following the procedure. How long: May continue to repeat daily, until discomfort goes away. Food intake: Start with clear liquids (like water) and advance to regular food, as tolerated.  Physical activities: Keep activities to a minimum for the first 8 hours after the procedure. After that, then as tolerated. Driving: If you have received any sedation, be responsible and do not drive. You are not allowed to drive for 24 hours after having sedation. Blood thinner: (Applies only to those taking blood thinners) You may restart your blood thinner 6 hours after your procedure. Insulin: (Applies only to Diabetic patients taking insulin) As soon as you can eat, you may resume your normal dosing schedule. Infection prevention: Keep procedure site clean and dry. Shower daily and clean area with soap and water.  PAIN DIARY Post-procedure Pain Diary: Extremely important that this be done correctly and accurately. Recorded information will be used to determine the next step in treatment. For the purpose of accuracy, follow these rules: Evaluate only the area treated. Do not report or include pain from an untreated area. For the purpose of this evaluation, ignore all other areas of pain, except for the treated area. After your  procedure, avoid taking a long nap and attempting to complete the pain diary after you wake up. Instead, set your alarm clock to go off every hour, on the hour, for the initial 8 hours after the procedure. Document the duration of the numbing medicine, and the relief you are getting from it. Do not go to sleep and attempt to complete it later. It will not be accurate. If you received sedation, it is likely that you were given a medication that may cause amnesia. Because of this, completing the diary at a later time may cause the information to be inaccurate. This information is needed to plan your care. Follow-up appointment: Keep your post-procedure follow-up evaluation appointment after the procedure (usually 2 weeks for most procedures, 6 weeks for radiofrequencies). DO NOT FORGET to bring you pain diary with you.   EXPECT... (What should I expect to see with my procedure?) From numbing medicine (AKA: Local Anesthetics): Numbness or decrease in pain. You may also experience some weakness, which if present, could last for the duration of the local anesthetic. Onset: Full effect within 15 minutes of injected. Duration: It will depend on the type of local anesthetic used. On the average, 1 to 8 hours.  From steroids (Applies only if steroids were used): Decrease in swelling or inflammation. Once inflammation is improved, relief of the pain will follow. Onset of benefits: Depends on the amount of swelling present. The more swelling, the longer it will take for the benefits to be seen. In some cases, up to 10 days. Duration: Steroids will stay in the system x 2 weeks. Duration of benefits will depend on multiple posibilities including persistent irritating  factors. Side-effects: If present, they may typically last 2 weeks (the duration of the steroids). Frequent: Cramps (if they occur, drink Gatorade and take over-the-counter Magnesium 450-500 mg once to twice a day); water retention with temporary weight  gain; increases in blood sugar; decreased immune system response; increased appetite. Occasional: Facial flushing (red, warm cheeks); mood swings; menstrual changes. Uncommon: Long-term decrease or suppression of natural hormones; bone thinning. (These are more common with higher doses or more frequent use. This is why we prefer that our patients avoid having any injection therapies in other practices.)  Very Rare: Severe mood changes; psychosis; aseptic necrosis. From procedure: Some discomfort is to be expected once the numbing medicine wears off. This should be minimal if ice and heat are applied as instructed.  CALL IF... (When should I call?) You experience numbness and weakness that gets worse with time, as opposed to wearing off. New onset bowel or bladder incontinence. (Applies only to procedures done in the spine)  Emergency Numbers: Durning business hours (Monday - Thursday, 8:00 AM - 4:00 PM) (Friday, 9:00 AM - 12:00 Noon): [phone] After hours: [phone] NOTE: If you are having a problem and are unable connect with, or to talk to a provider, then go to your nearest urgent care or emergency department. If the problem is serious and urgent, please call 911. ______________________________________________________________________     ______________________________________________________________________    Steroid injections  Common steroids for injections Triamcinolone : Used by many sports medicine physicians for large joint and bursal injections, often combined with a local anesthetic like lidocaine . A study focusing on coccydynia (tailbone pain) found triamcinolone  was more effective than betamethasone, suggesting it may also be preferable for other localized inflammation conditions. Methylprednisolone : A common alternative to triamcinolone  that is also a strong anti-inflammatory. It is available in different formulations, with the acetate suspension being the long-acting  option for intra-articular injections. Dexamethasone : This is a non-particulate steroid, meaning it has a lower risk of tissue damage compared to particulate steroids like triamcinolone  and methylprednisolone . While less common for this specific use, it is an option for targeted injections.   Considerations for physicians Particulate vs. non-particulate steroids: Triamcinolone  and methylprednisolone  are particulate, meaning they can clump together. Dexamethasone  is non-particulate. Particulate steroids are often preferred for their longer-lasting effects but carry a theoretical higher risk for certain injections (though this is less of a concern in the costochondral joints). Combined injectate: Corticosteroids are typically mixed with a local anesthetic like lidocaine  to provide both immediate pain relief (from the anesthetic) and longer-term inflammation reduction (from the steroid). Imaging guidance: To ensure accurate placement of the needle and medication, physicians may use ultrasound or fluoroscopic guidance for the injection, especially in complex or refractory cases.   Patient guidance Before undergoing a steroid injection, discuss the options with your physician. They will determine the best steroid, dosage, and procedure for your specific case based on factors like: Severity of your condition History of response to other treatments Your overall health status Experience and preference of the physician  Last  Updated: 05/22/2024 ______________________________________________________________________     ______________________________________________________________________    Blood Thinners  IMPORTANT NOTICE:  If you take any of these, make sure to notify the nursing staff.  Failure to do so may result in serious injury.  Recommended time intervals to stop and restart blood-thinners, before & after invasive procedures  Generic Name Brand Name Pre-procedure: Stop medication for this  amount of time before your procedure: Post-procedure: Wait this amount of time after  the procedure before restarting your medication:  Abciximab Reopro 15 days 2 hrs  Alteplase Activase 10 days 10 days  Anagrelide Agrylin    Apixaban Eliquis 3 days 6 hrs  Cilostazol Pletal 3 days 5 hrs  Clopidogrel  Plavix  7-10 days 2 hrs  Dabigatran Pradaxa 5 days 6 hrs  Dalteparin Fragmin 24 hours 4 hrs  Dipyridamole Aggrenox 11days 2 hrs  Edoxaban Lixiana; Savaysa 3 days 2 hrs  Enoxaparin   Lovenox  24 hours 4 hrs  Eptifibatide Integrillin 8 hours 2 hrs  Fondaparinux  Arixtra 72 hours 12 hrs  Hydroxychloroquine Plaquenil 11 days   Prasugrel Effient 7-10 days 6 hrs  Reteplase Retavase 10 days 10 days  Rivaroxaban Xarelto 3 days 6 hrs  Ticagrelor Brilinta 5-7 days 6 hrs  Ticlopidine Ticlid 10-14 days 2 hrs  Tinzaparin Innohep 24 hours 4 hrs  Tirofiban Aggrastat 8 hours 2 hrs  Warfarin Coumadin 5 days 2 hrs   Other medications with blood-thinning effects  NOTE: Consider stopping these if you have prolonged bleeding despite not taking any of the above blood thinners. Otherwise ask your provider and this will be decided on a case-by-case basis.  Product indications Generic (Brand) names Note  Cholesterol Lipitor Stop 4 days before procedure  Blood thinner (injectable) Heparin (LMW or LMWH Heparin) Stop 24 hours before procedure  Cancer Ibrutinib (Imbruvica) Stop 7 days before procedure  Malaria/Rheumatoid Hydroxychloroquine (Plaquenil) Stop 11 days before procedure  Thrombolytics  10 days before or after procedures   Over-the-counter (OTC) Products with blood-thinning effects  NOTE: Consider stopping these if you have prolonged bleeding despite not taking any of the above blood thinners. Otherwise ask your provider and this will be decided on a case-by-case basis.  Product Common names Stop Time  Aspirin  > 325 mg Goody Powders, Excedrin, etc. 11 days  Aspirin  <= 81 mg  7 days  Fish oil  4 days   Garlic supplements  7 days  Ginkgo biloba  36 hours  Ginseng  24 hours  NSAIDs Ibuprofen, Naprosyn, etc. 3 days  Vitamin E  4 days   ______________________________________________________________________

## 2024-07-10 NOTE — Progress Notes (Signed)
 PROVIDER NOTE: Interpretation of information contained herein should be left to medically-trained personnel. Specific patient instructions are provided elsewhere under Patient Instructions section of medical record. This document was created in part using STT-dictation technology, any transcriptional errors that may result from this process are unintentional.  Patient: Brandon Bright Type: Established DOB: [birth year] MRN: [id number] PCP: Cyrus Selinda Moose, PA-C  Service: Procedure DOS: 07/10/2024 Setting: Ambulatory Location: Ambulatory outpatient facility Delivery: Face-to-face Provider: Eric DELENA Como, MD Specialty: Interventional Pain Management Specialty designation: 09 Location: Outpatient facility Ref. Prov.: Cyrus Selinda Hestle,*       Interventional Therapy   Procedure: Lumbar trans-foraminal epidural steroid injection (L-TFESI) #2  Laterality: Right (-RT)  Level: L4 nerve root(s) Imaging: Fluoroscopy-guided Spinal ([id number]) Anesthesia: Local anesthesia (1-2% Lidocaine ) Anxiolysis: None                 Sedation: No Sedation                       DOS: 07/10/2024  Performed by: Eric DELENA Como, MD  Purpose: Diagnostic/Therapeutic Indications: Lumbar radicular pain severe enough to impact quality of life or function. 1. Chronic lower extremity pain (1ry area of Pain) (Right)   2. Lumbosacral radiculopathy at L4 (Right)   3. Lumbar radiculitis (Right) (L3 & L4)   4. 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 Chronic midline low back pain (2ry area of Pain) w/ sciatica (Right)   6. Lumbar disc protrusion (Left: L2-3) (Central: L5-S1)   7. Lumbar spondylosis   8. Degeneration of intervertebral disc of lumbar region, unspecified whether pain present   9. Chronic anticoagulation (PLAVIX )    NAS-11 Pain score:   Pre-procedure: 9 /10   Post-procedure: 9 /10     Position / Prep / Materials:  Position: Prone   Prep solution: ChloraPrep (2% chlorhexidine gluconate and 70% isopropyl alcohol) Prep Area: Entire Posterior Lumbosacral Area.  From the lower tip of the scapula down to the tailbone and from flank to flank. Materials:  Tray: Block Needle(s):  Type: Spinal  Gauge (G): 22  Length: 5-in  Qty: 1     H&P (Pre-op Assessment):  [name] is a 78 y.o. (year old), male patient, seen today for interventional treatment. He  has a past surgical history that includes ganglion cyst removal (Right); Cataract extraction w/PHACO (Left, 02/04/2016); Colonoscopy; Pilonidal cyst excision; Cardiac catheterization (1999?); Cataract extraction w/PHACO (Right, 03/03/2016); Eye surgery (May and June); Colonoscopy with propofol  (N/A, 06/05/2018); Colonoscopy with propofol  (N/A, 03/21/2023); biopsy (03/21/2023); and polypectomy (03/21/2023). Mr. Losito has a current medication list which includes the following prescription(s): amlodipine , atorvastatin , cholecalciferol, clopidogrel , gabapentin , hypertonic nasal wash, ipratropium, latanoprost, centrum silver  50+men, multiple vitamins-minerals, potassium chloride , and valsartan -hydrochlorothiazide . His primarily concern today is the Back Pain (Right lower back)  Initial Vital Signs:  Pulse/HCG Rate: 74ECG Heart Rate: 72 Temp: (!) 97.5 F (36.4 C) Resp: 20 BP: 129/68 SpO2: 97 %  BMI: Estimated body mass index is 30.99 kg/m as calculated from the following:   Height as of this encounter: 5' 10 (1.778 m).   Weight as of this encounter: 216 lb (98 kg).  Risk Assessment: Allergies: Reviewed. He is allergic to codeine, vicodin [hydrocodone-acetaminophen], celebrex [celecoxib], and ultram [tramadol].  Allergy Precautions: None required Coagulopathies: Reviewed. None identified.  Blood-thinner therapy: None at this time Active Infection(s): Reviewed. None identified. [name] is afebrile  Site Confirmation: Mr. Eidem was asked to confirm the procedure  and laterality  before marking the site Procedure checklist: Completed Consent: Before the procedure and under the influence of no sedative(s), amnesic(s), or anxiolytics, the patient was informed of the treatment options, risks and possible complications. To fulfill our ethical and legal obligations, as recommended by the American Medical Association's Code of Ethics, I have informed the patient of my clinical impression; the nature and purpose of the treatment or procedure; the risks, benefits, and possible complications of the intervention; the alternatives, including doing nothing; the risk(s) and benefit(s) of the alternative treatment(s) or procedure(s); and the risk(s) and benefit(s) of doing nothing. The patient was provided information about the general risks and possible complications associated with the procedure. These may include, but are not limited to: failure to achieve desired goals, infection, bleeding, organ or nerve damage, allergic reactions, paralysis, and death. In addition, the patient was informed of those risks and complications associated to Spine-related procedures, such as failure to decrease pain; infection (i.e.: Meningitis, epidural or intraspinal abscess); bleeding (i.e.: epidural hematoma, subarachnoid hemorrhage, or any other type of intraspinal or peri-dural bleeding); organ or nerve damage (i.e.: Any type of peripheral nerve, nerve root, or spinal cord injury) with subsequent damage to sensory, motor, and/or autonomic systems, resulting in permanent pain, numbness, and/or weakness of one or several areas of the body; allergic reactions; (i.e.: anaphylactic reaction); and/or death. Furthermore, the patient was informed of those risks and complications associated with the medications. These include, but are not limited to: allergic reactions (i.e.: anaphylactic or anaphylactoid reaction(s)); adrenal axis suppression; blood sugar elevation that in diabetics may result in ketoacidosis or comma;  water retention that in patients with history of congestive heart failure may result in shortness of breath, pulmonary edema, and decompensation with resultant heart failure; weight gain; swelling or edema; medication-induced neural toxicity; particulate matter embolism and blood vessel occlusion with resultant organ, and/or nervous system infarction; and/or aseptic necrosis of one or more joints. Finally, the patient was informed that Medicine is not an exact science; therefore, there is also the possibility of unforeseen or unpredictable risks and/or possible complications that may result in a catastrophic outcome. The patient indicated having understood very clearly. We have given the patient no guarantees and we have made no promises. Enough time was given to the patient to ask questions, all of which were answered to the patient's satisfaction. Mr. Gelles has indicated that he wanted to continue with the procedure. Attestation: I, the ordering provider, attest that I have discussed with the patient the benefits, risks, side-effects, alternatives, likelihood of achieving goals, and potential problems during recovery for the procedure that I have provided informed consent. Date  Time: 07/10/2024  1:20 PM  Pre-Procedure Preparation:  Monitoring: As per clinic protocol. Respiration, ETCO2, SpO2, BP, heart rate and rhythm monitor placed and checked for adequate function Safety Precautions: Patient was assessed for positional comfort and pressure points before starting the procedure. Time-out: I initiated and conducted the Time-out before starting the procedure, as per protocol. The patient was asked to participate by confirming the accuracy of the Time Out information. Verification of the correct person, site, and procedure were performed and confirmed by me, the nursing staff, and the patient. Time-out conducted as per Joint Commission's Universal Protocol (UP.01.01.01). Time: 1353 Start Time:  1353 hrs.  Description/Narrative of Procedure:          Target: The 6 o'clock position under the pedicle, on the affected side. Region: Posterolateral Lumbosacral Approach: Posterior Percutaneous Paravertebral approach.  Rationale (medical necessity):  procedure needed and proper for the diagnosis and/or treatment of the patient's medical symptoms and needs. Procedural Technique Safety Precautions: Aspiration looking for blood return was conducted prior to all injections. At no point did we inject any substances, as a needle was being advanced. No attempts were made at seeking any paresthesias. Safe injection practices and needle disposal techniques used. Medications properly checked for expiration dates. SDV (single dose vial) medications used. Description of the Procedure: Protocol guidelines were followed. The patient was placed in position over the procedure table. The target area was identified and the area prepped in the usual manner. Skin & deeper tissues infiltrated with local anesthetic. Appropriate amount of time allowed to pass for local anesthetics to take effect. The procedure needles were then advanced to the target area. Proper needle placement secured. Negative aspiration confirmed. Solution injected in intermittent fashion, asking for systemic symptoms every 0.5cc of injectate. The needles were then removed and the area cleansed, making sure to leave some of the prepping solution back to take advantage of its long term bactericidal properties.  Vitals:   07/10/24 1318 07/10/24 1353  BP: 129/68 (!) 148/71  Pulse: 74   Resp:  20  Temp: (!) 97.5 F (36.4 C)   SpO2: 97% 99%  Weight: 216 lb (98 kg)   Height: 5' 10 (1.778 m)     Start Time: 1353 hrs. End Time:   hrs.  Imaging Guidance (Spinal):          Type of Imaging Technique: Fluoroscopy Guidance (Spinal) Indication(s): Fluoroscopy guidance for needle placement to enhance accuracy in procedures requiring precise needle  localization for targeted delivery of medication in or near specific anatomical locations not easily accessible without such real-time imaging assistance. Exposure Time: Please see nurses notes. Contrast: Before injecting any contrast, we confirmed that the patient did not have an allergy to iodine , shellfish, or radiological contrast. Once satisfactory needle placement was completed at the desired level, radiological contrast was injected. Contrast injected under live fluoroscopy. No contrast complications. See chart for type and volume of contrast used. Fluoroscopic Guidance: I was personally present during the use of fluoroscopy. Tunnel Vision Technique used to obtain the best possible view of the target area. Parallax error corrected before commencing the procedure. Direction-depth-direction technique used to introduce the needle under continuous pulsed fluoroscopy. Once target was reached, antero-posterior, oblique, and lateral fluoroscopic projection used confirm needle placement in all planes. Images permanently stored in EMR. Interpretation: I personally interpreted the imaging intraoperatively. Adequate needle placement confirmed in multiple planes. Appropriate spread of contrast into desired area was observed. No evidence of afferent or efferent intravascular uptake. No intrathecal or subarachnoid spread observed. Permanent images saved into the patient's record.  Post-operative Assessment:  Post-procedure Vital Signs:  Pulse/HCG Rate: 7472 Temp: (!) 97.5 F (36.4 C) Resp: 20 BP: (!) 148/71 SpO2: 99 %  EBL: None  Complications: No immediate post-treatment complications observed by team, or reported by patient.  Note: The patient tolerated the entire procedure well. A repeat set of vitals were taken after the procedure and the patient was kept under observation following institutional policy, for this type of procedure. Post-procedural neurological assessment was performed, showing  return to baseline, prior to discharge. The patient was provided with post-procedure discharge instructions, including a section on how to identify potential problems. Should any problems arise concerning this procedure, the patient was given instructions to immediately contact us , at any time, without hesitation. In any case, we plan to contact the patient by  telephone for a follow-up status report regarding this interventional procedure.  Comments:  No additional relevant information.  Plan of Care (POC)  Orders:  Orders Placed This Encounter  Procedures   Lumbar Transforaminal Epidural    Scheduling Instructions:     Laterality: Right-sided     Level(s): L4             Sedation: Patient's choice.     Date: 07/10/2024    Where will this procedure be performed?:   [facility]   [facility] C-ARM 1-60 MIN NO REPORT    Intraoperative interpretation by procedural physician at [facility].    Standing Status:   Standing    Number of Occurrences:   1    Reason for exam::   Assistance in needle guidance and placement for procedures requiring needle placement in or near specific anatomical locations not easily accessible without such assistance.   Informed Consent Details: Physician/Practitioner Attestation; Transcribe to consent form and obtain patient signature    Provider Attestation: I, Carolena Fairbank A. Tanya, MD, (Pain Management Specialist), the physician/practitioner, attest that I have discussed with the patient the benefits, risks, side effects, alternatives, likelihood of achieving goals and potential problems during recovery for the procedure that I have provided informed consent.    Scheduling Instructions:     Note: Always confirm laterality of pain with Mr. Hartsell, before procedure.     Transcribe to consent form and obtain patient signature.    Physician/Practitioner attestation of informed consent for procedure/surgical case:   I, the physician/practitioner,  attest that I have discussed with the patient the benefits, risks, side effects, alternatives, likelihood of achieving goals and potential problems during recovery for the procedure that I have provided informed consent.    Procedure:   Diagnostic lumbar transforaminal epidural steroid injection under fluoroscopic guidance. (See notes for level and laterality.)    Physician/Practitioner performing the procedure:   Breeze Berringer A. Tanya, MD    Indication/Reason:   Lumbar radiculopathy/radiculitis associated with lumbar stenosis   Care order/instruction: Please confirm that the patient has stopped the Plavix  (Clopidogrel ) x 7-10 days prior to procedure or surgery.    Please confirm that the patient has stopped the Plavix  (Clopidogrel ) x 7-10 days prior to procedure or surgery.    Standing Status:   Standing    Number of Occurrences:   1   Provide equipment / supplies at bedside    Procedural tray: Epidural Tray (Disposable  single use) Skin infiltration needle: Regular 1.5-in, 25-G, (x1) Block needle size: Regular standard Catheter: No catheter required    Standing Status:   Standing    Number of Occurrences:   1    Specify:   Epidural Tray    Opioid Analgesic:  No opioid analgesics prescribed by our practice.   Medications ordered for procedure: Meds ordered this encounter  Medications   iohexol  (OMNIPAQUE ) 180 MG/ML injection 10 mL    Must be Myelogram-compatible. If not available, you may substitute with a water-soluble, non-ionic, hypoallergenic, myelogram-compatible radiological contrast medium.   lidocaine  (XYLOCAINE ) 2 % (with pres) injection 400 mg   pentafluoroprop-tetrafluoroeth (GEBAUERS) aerosol   DISCONTD: midazolam  (VERSED ) injection 0.5-2 mg    Make sure Flumazenil is available in the pyxis when using this medication. If oversedation occurs, administer 0.2 mg IV over 15 sec. If after 45 sec no response, administer 0.2 mg again over 1 min; may repeat at 1 min intervals; not to  exceed 4 doses (1 mg)  DISCONTD:sodium chloride  flush (NS) 0.9 % injection 2 mL   DISCONTD:ropivacaine  (PF) 2 mg/mL (0.2%) (NAROPIN ) injection 2 mL   DISCONTD:triamcinolone  acetonide (KENALOG -40) injection 40 mg   sodium chloride  flush (NS) 0.9 % injection 1 mL   ropivacaine  (PF) 2 mg/mL (0.2%) (NAROPIN ) injection 1 mL   dexamethasone  (DECADRON ) injection 10 mg   Medications administered: We administered iohexol , lidocaine , pentafluoroprop-tetrafluoroeth, sodium chloride  flush, ropivacaine  (PF) 2 mg/mL (0.2%), triamcinolone  acetonide, sodium chloride  flush, ropivacaine  (PF) 2 mg/mL (0.2%), and dexamethasone .  See the medical record for exact dosing, route, and time of administration.    Interventional Therapies  Risk Factors  Considerations  Medical Comorbidities:  Plavix  Anticoagulation: (Stop: 7-10 days  Restart: 2 hours)  TIAs  HTN  T2NIDDM  (B) Carotid Stenosis  RBBB   Planned  Pending:   Therapeutic right L4 TFESI #2 (07/11/2023)    Under consideration:   Therapeutic right L4 TFESI #2    Completed:   Diagnostic/therapeutic right L4 TFESI x1 (04/01/2022) (100/100/100/100)  Diagnostic/therapeutic left IA hip joint injection x2 (12/01/2023) (100/100/100/90-100)    Therapeutic  Palliative (PRN) options:   Palliative/therapeutic left IA hip joint injection #3  Palliative right L4-5 LESI (none done by me)   Completed by other providers:   The patient indicated having had LESI x4, by Dr. Dannial. (before 2000) (Procedure described as helpful).        Follow-up plan:   Return in about 2 weeks (around 07/24/2024) for (Face2F), (PPE).     Recent Visits Date Type Provider Dept  06/25/24 Office Visit Tanya Glisson, MD [facility]  06/18/24 Office Visit Tanya Glisson, MD [facility]  06/04/24 Office Visit Tanya Glisson, MD [facility]  05/07/24 Office Visit Tanya Glisson, MD [facility]  Showing recent visits within  past 90 days and meeting all other requirements Today's Visits Date Type Provider Dept  07/10/24 Procedure visit Tanya Glisson, MD [facility]  Showing today's visits and meeting all other requirements Future Appointments Date Type Provider Dept  07/30/24 Appointment Tanya Glisson, MD [facility]  Showing future appointments within next 90 days and meeting all other requirements   Disposition: Discharge home  Discharge (Date  Time): 07/10/2024; 1410 hrs.   Primary Care Physician: Cyrus Selinda Moose, PA-C Location: [facility] Note by: Glisson DELENA Tanya, MD (TTS technology used. I apologize for any typographical errors that were not detected and corrected.) Date: 07/10/2024; Time: 2:11 PM  Disclaimer:  Medicine is not an Visual merchandiser. The only guarantee in medicine is that nothing is guaranteed. It is important to note that the decision to proceed with this intervention was based on the information collected from the patient. The Data and conclusions were drawn from the patient's questionnaire, the interview, and the physical examination. Because the information was provided in large part by the patient, it cannot be guaranteed that it has not been purposely or unconsciously manipulated. Every effort has been made to obtain as much relevant data as possible for this evaluation. It is important to note that the conclusions that lead to this procedure are derived in large part from the available data. Always take into account that the treatment will also be dependent on availability of resources and existing treatment guidelines, considered by other Pain Management Practitioners as being common knowledge and practice, at the time of the intervention. For Medico-Legal purposes, it is also important to point out that variation in procedural techniques and pharmacological choices are the  acceptable norm. The indications,  contraindications, technique, and results of the above procedure should only be interpreted and judged by a Board-Certified Interventional Pain Specialist with extensive familiarity and expertise in the same exact procedure and technique.

## 2024-07-11 ENCOUNTER — Telehealth: Payer: Self-pay

## 2024-07-15 ENCOUNTER — Encounter: Payer: Self-pay | Admitting: Pain Medicine

## 2024-07-16 ENCOUNTER — Telehealth: Payer: Self-pay | Admitting: Pain Medicine

## 2024-07-16 NOTE — Telephone Encounter (Signed)
 PT stated he sent a message to the DR about the pain he is having in his back and leg. He needs to speak to someone thank you.

## 2024-07-16 NOTE — Telephone Encounter (Signed)
 Returned patient phone call, he continues to have pain in the lower back, he s/p L TFESI right side.  When he rises in the a.m. pain is good, but as he goes about his day the pain worsens.  He is unable to stand or walk for any distance.  Unable to get comfortable in bed and is having to sleep in the recliner for comfort.  He is wondering if anything else can be done right now.  I did explain that the procedure was just 6 days ago and we will need to give it the 14 days while steroid is active.  During activity he is having to take frequent breaks and when sitting the pain is completely gone.  Most of the pain is just below right knee.  Is there anything different that we can do?  Pain is in lower right back and just below the right knee.

## 2024-07-19 NOTE — Progress Notes (Signed)
 PROVIDER NOTE: Interpretation of information contained herein should be left to medically-trained personnel. Specific patient instructions are provided elsewhere under Patient Instructions section of medical record. This document was created in part using AI and STT-dictation technology, any transcriptional errors that may result from this process are unintentional.  Patient: Brandon Bright  Service: E/M   PCP: Cyrus Selinda Moose, PA-C  DOB: September 05, 1946  DOS: 07/23/2024  Provider: Eric DELENA Como, MD  MRN: [id number]  Delivery: Face-to-face  Specialty: Interventional Pain Management  Type: Established Patient  Setting: Ambulatory outpatient facility  Specialty designation: 09  Referring Prov.: Cyrus Selinda Moose,*  Location: Outpatient office facility       History of present illness (HPI) Brandon Bright, a 78 y.o. year old male, is here today because of his Chronic midline low back pain with right-sided sciatica [M54.41, G89.29]. Brandon Bright primary complain today is Leg Pain (Right, anteriior, just below the knee)  Pertinent problems: Brandon Bright has DDD (degenerative disc disease), cervical; DDD (degenerative disc disease), lumbar; Lumbar facet syndrome; Cervical facet syndrome; Degenerative arthritis of spine; Tendinitis of wrist; Chronic lower extremity pain (1ry area of Pain) (Right); Chronic pain syndrome; Lumbar spondylosis; Chronic midline low back pain (2ry area of Pain) w/ sciatica (Right); Lumbar discogenic pain syndrome; Chronic neck pain (3ry area of Pain) (Left); Lumbar radiculitis (Right) (L3 & L4); Lumbar foraminal stenosis (Left: L1-2, L2-3, L5-S1) (Right: L3-4, L4-5) (SEVERE: L4-5); Lumbar disc protrusion (Left: L2-3) (Central: L5-S1); Lumbar facet arthropathy (Bilateral); Cervicalgia; Chronic hip pain (Left); Arthralgia of hip (Left); Osteoarthritis of hip (Left); Enthesopathy of hip region (Left); Other bursitis of hip (Left); Lumbosacral radiculopathy at L4 (Right);  Abnormal MRI, lumbar spine (03/22/2022); Chronic pain disorder; and Chronic hip pain (Right) on their pertinent problem list.  Pain Assessment: Severity of Chronic pain is reported as a 10-Worst pain ever/10. Location: Leg Right, Anterior, Lower/denies. Onset: More than a month ago. Quality: Dull. Timing: Intermittent. Modifying factor(s): sitting. Vitals:  height is 5' 10 (1.778 m) and weight is 216 lb (98 kg). His temporal temperature is 98.4 F (36.9 C). His blood pressure is 133/47 (abnormal) and his pulse is 75. His respiration is 16 and oxygen saturation is 96%.  BMI: Estimated body mass index is 30.99 kg/m as calculated from the following:   Height as of this encounter: 5' 10 (1.778 m).   Weight as of this encounter: 216 lb (98 kg).  Last encounter: 06/25/2024. Last procedure: 07/10/2024.  Reason for encounter: post-procedure evaluation and assessment.   Discussed the use of AI scribe software for clinical note transcription with the patient, who gave verbal consent to proceed.  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  He underwent a right-sided L4 transforaminal epidural steroid injection on July 10, 2024, for radiculopathy due to severe right-sided L4/5 foraminal stenosis. The injection provided only a 20% improvement during the duration of the numbing medicine, with no long-term benefit. The procedure was performed without sedation.  Pain is located below the right knee, previously extending further down the leg but never reaching the foot. It is exacerbated by standing and walking, while sitting provides relief. He has been unable to sleep in a bed due to discomfort and has been sleeping in a recliner, which he finds more comfortable.  His medical history includes additional foraminal stenosis on the left side at L1/2, L2/3, and L4, and  on the right  side at L3/4 and L4/5, with the L4/5 level being severe. He also has a lumbar disc protrusion at L2/3 on the left side and a central disc protrusion at L5/S1.      Post-Procedure Evaluation   Procedure: Lumbar trans-foraminal epidural steroid injection (L-TFESI) #2  Laterality: Right (-RT)  Level: L4 nerve root(s) Imaging: Fluoroscopy-guided Spinal ([id number]) Anesthesia: Local anesthesia (1-2% Lidocaine ) Anxiolysis: None                 Sedation: No Sedation                       DOS: 07/10/2024  Performed by: Eric DELENA Como, MD  Purpose: Diagnostic/Therapeutic Indications: Lumbar radicular pain severe enough to impact quality of life or function. 1. Chronic lower extremity pain (1ry area of Pain) (Right)   2. Lumbosacral radiculopathy at L4 (Right)   3. Lumbar radiculitis (Right) (L3 & L4)   4. 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 Chronic midline low back pain (2ry area of Pain) w/ sciatica (Right)   6. Lumbar disc protrusion (Left: L2-3) (Central: L5-S1)   7. Lumbar spondylosis   8. Degeneration of intervertebral disc of lumbar region, unspecified whether pain present   9. Chronic anticoagulation (PLAVIX )    NAS-11 Pain score:   Pre-procedure: 9 /10   Post-procedure: 9 /10     Effectiveness:  Initial hour after procedure: 20 %. Subsequent 4-6 hours post-procedure: 20 %. Analgesia past initial 6 hours: 0 %. Ongoing improvement:  Analgesic: Further questioning reveals that the patient actually has 100% relief of the pain as long as he is sitting down.  This is related to the mechanics of foraminal stenosis which is relieved by flexion of the lumbar spine.  He also indicates that he has attained some relief of the right lower extremity where the pain used to go further down towards his foot and it is no longer doing that.  He refers that the pain is located just below his right knee in an area that is approximately 10  inches long in the superior anterolateral portion of the distal right leg, just below the patella. Function: Minimal improvement.  He prefers only being able to sleep in a recliner. ROM: Minimal improvement  Pharmacotherapy Assessment   Opioid Analgesic:  No opioid analgesics prescribed by our practice.  Monitoring: [place] PMP: PDMP reviewed during this encounter.       Pharmacotherapy: No side-effects or adverse reactions reported. Compliance: No problems identified. Effectiveness: Clinically acceptable.  No notes on file  UDS:  No results found for: SUMMARY  No results found for: CBDTHCR No results found for: D8THCCBX No results found for: D9THCCBX  ROS  Constitutional: Denies any fever or chills Gastrointestinal: No reported hemesis, hematochezia, vomiting, or acute GI distress Musculoskeletal: Denies any acute onset joint swelling, redness, loss of ROM, or weakness Neurological: No reported episodes of acute onset apraxia, aphasia, dysarthria, agnosia, amnesia, paralysis, loss of coordination, or loss of consciousness  Medication Review  Centrum Silver  50+Men, Hypertonic Nasal Wash, Multiple Vitamins-Minerals, amLODipine , atorvastatin , cholecalciferol, clopidogrel , gabapentin , ipratropium, latanoprost, potassium chloride , and valsartan -hydrochlorothiazide   History Review  Allergy: Brandon Bright is allergic to codeine, vicodin [hydrocodone-acetaminophen], celebrex [celecoxib], and ultram [tramadol]. Drug: Brandon Bright  reports no history of drug use. Alcohol:  reports no history of alcohol use. Tobacco:  reports that he has never smoked. He has never used smokeless tobacco. Social:  Brandon Bright  reports that he has never smoked. He has never used smokeless tobacco. He reports that he does not drink alcohol and does not use drugs. 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 Surgical: Brandon Bright  has a past surgical history that includes ganglion cyst removal (Right); Cataract extraction w/PHACO (Left, 02/04/2016); Colonoscopy; Pilonidal cyst excision; Cardiac catheterization (1999?); Cataract extraction w/PHACO (Right, 03/03/2016); Eye surgery (May and June); Colonoscopy with propofol  (N/A, 06/05/2018); Colonoscopy with propofol  (N/A, 03/21/2023); biopsy (03/21/2023); and polypectomy (03/21/2023). Family: family history includes Dementia in his mother; Diabetes in his brother; Heart disease in his brother, brother, and father.  Laboratory Chemistry Profile   Renal Lab Results  Component Value Date   BUN 27 (H) 04/29/2023   CREATININE 0.96 04/29/2023   GFRAA >60 08/17/2014   GFRNONAA >60 04/29/2023    Hepatic Lab Results  Component Value Date   AST 21 04/28/2023   ALT 24 04/28/2023   ALBUMIN 3.8 04/28/2023   ALKPHOS 66 04/28/2023   LIPASE 75 08/16/2014    Electrolytes Lab Results  Component Value Date   NA 141 04/29/2023   K 3.6 04/29/2023   CL 105 04/29/2023   CALCIUM  9.6 04/29/2023   MG 2.2 04/28/2023    Bone No results found for: VD25OH, VD125OH2TOT, CI6874NY7, CI7874NY7, 25OHVITD1, 25OHVITD2, 25OHVITD3, TESTOFREE, TESTOSTERONE  Inflammation (CRP: Acute Phase) (ESR: Chronic Phase) Lab Results  Component Value Date   CRP 2 08/28/2020   ESRSEDRATE 26 08/28/2020         Note: Above Lab results reviewed.  Recent Imaging Review  DG PAIN CLINIC C-ARM 1-60 MIN NO REPORT Fluoro was used, but no Radiologist interpretation will be provided.  Please refer to NOTES tab for provider progress note. Note: Reviewed        Physical Exam  Vitals: BP (!) 133/47 (Cuff Size: Large)   Pulse 75   Temp 98.4 F (36.9 C) (Temporal)   Resp 16   Ht 5' 10  (1.778 m)   Wt 216 lb (98 kg)   SpO2 96%   BMI 30.99 kg/m  BMI: Estimated body mass index is 30.99 kg/m as calculated from the following:   Height as of this encounter: 5' 10 (1.778 m).   Weight as of this encounter: 216 lb (98 kg). Ideal: Ideal body weight: 73 kg (160 lb 15 oz) Adjusted ideal body weight: 83 kg (182 lb 15.4 oz) General appearance: Well nourished, well developed, and well hydrated. In no apparent acute distress Mental status: Alert, oriented x 3 (person, place, & time)       Respiratory: No evidence of acute respiratory distress Eyes: PERLA   Assessment   Diagnosis Status  1. Chronic midline low back pain (2ry area of Pain) w/ sciatica (Right)   2. Chronic lower extremity pain (1ry area of Pain) (Right)   3. Lumbar disc protrusion (Left: L2-3) (Central: L5-S1)   4. 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 Lumbosacral radiculopathy at L4 (Right)   6. Lumbar radiculitis (Right) (L3 & L4)   7. Abnormal MRI, lumbar spine (03/22/2022)   8. Chronic anticoagulation (PLAVIX )    Controlled Controlled Controlled   Updated Problems: No problems updated.  Plan of Care  Problem-specific:  Assessment and Plan    Severe  right L4-5 lumbar foraminal stenosis with multilevel lumbar foraminal stenosis and right lumbosacral radiculopathy   Severe right L4-5 foraminal stenosis with multilevel lumbar foraminal stenosis is causing right lumbosacral radiculopathy. A previous right-sided L4 transforaminal epidural steroid injection provided only 20% improvement during the numbing medicine's effect. Pain is primarily below the right knee and worsens with standing, indicating nerve pressure when the foramen closes. Sitting relieves the pain by opening the foramen. Perform L3 and L4 transforaminal epidural steroid injections without sedation. Instruct him to monitor and report pain relief and duration post-procedure. Consider surgical  referral if symptoms persist after three injections.  Lumbar disc protrusion at left L2-3 and central L5-S1   Lumbar disc protrusion at left L2-3 and central L5-S1 is noted. Current focus is on addressing severe foraminal stenosis and radiculopathy symptoms.       Brandon Bright has a current medication list which includes the following long-term medication(s): amlodipine , atorvastatin , gabapentin , hypertonic nasal wash, ipratropium, potassium chloride , and valsartan -hydrochlorothiazide .  Pharmacotherapy (Medications Ordered): No orders of the defined types were placed in this encounter.  Orders:  Orders Placed This Encounter  Procedures   Lumbar Transforaminal Epidural    Standing Status:   Future    Expiration Date:   10/23/2024    Scheduling Instructions:     Laterality: Right-sided     Level(s): L3 & L4     Sedation: No Sedation.     Timeframe: see Blood-thinner protocol.    Where will this procedure be performed?:   [facility]   Blood Thinner Instructions to Nursing    Always make sure patient has clearance from prescribing physician to stop blood thinners for interventional therapies. If the patient requires a Lovenox -bridge therapy, make sure arrangements are made to institute it with the assistance of the PCP.    Scheduling Instructions:     Have Brandon Bright stop the Plavix  (Clopidogrel ) x 7-10 days prior to procedure or surgery.     Interventional Therapies  Risk Factors  Considerations  Medical Comorbidities:  Plavix  Anticoagulation: (Stop: 7-10 days  Restart: 2 hours)  TIAs  HTN  T2NIDDM  (B) Carotid Stenosis  RBBB   Planned  Pending:   Therapeutic right L4 + L3 TFESI    Under consideration:   Therapeutic right L4 TFESI #2    Completed:   Diagnostic/therapeutic right L4 TFESI x2 (04/01/2022, 07/11/2023) (1st:100/100/100/100) (20/20/sitting:100/standing: 10) Diagnostic/therapeutic left IA hip joint injection x2 (12/01/2023) (100/100/100/90-100)     Therapeutic  Palliative (PRN) options:   Palliative/therapeutic left IA hip joint injection #3  Palliative right L4-5 LESI (none done by me)   Completed by other providers:   The patient indicated having had LESI x4, by Dr. Dannial. (before 2000) (Procedure described as helpful).       Return for (Clinic): (R) L3 & L4 TFESI, (Blood Thinner Protocol).    Recent Visits Date Type Provider Dept  07/10/24 Procedure visit Tanya Glisson, MD [facility]  06/25/24 Office Visit Tanya Glisson, MD [facility]  06/18/24 Office Visit Tanya Glisson, MD [facility]  06/04/24 Office Visit Tanya Glisson, MD [facility]  05/07/24 Office Visit Tanya Glisson, MD [facility]  Showing recent visits within past 90 days and meeting all other requirements Today's Visits Date Type Provider Dept  07/23/24 Office Visit Tanya Glisson, MD [facility]  Showing today's visits and meeting all other requirements Future Appointments No visits were found meeting these conditions. Showing future appointments within next  90 days and meeting all other requirements  I discussed the assessment and treatment plan with the patient. The patient was provided an opportunity to ask questions and all were answered. The patient agreed with the plan and demonstrated an understanding of the instructions.  Patient advised to call back or seek an in-person evaluation if the symptoms or condition worsens.  Duration of encounter: 30 minutes.  Total time on encounter, as per AMA guidelines included both the face-to-face and non-face-to-face time personally spent by the physician and/or other qualified health care professional(s) on the day of the encounter (includes time in activities that require the physician or other qualified health care professional and does not include time in activities normally performed by clinical staff).    Note by:  Eric DELENA Como, MD (TTS and AI technology used. I apologize for any typographical errors that were not detected and corrected.) Date: 07/23/2024; Time: 10:34 AM

## 2024-07-19 NOTE — Patient Instructions (Signed)
 ______________________________________________________________________    Blood Thinners  IMPORTANT NOTICE:  If you take any of these, make sure to notify the nursing staff.  Failure to do so may result in serious injury.  Recommended time intervals to stop and restart blood-thinners, before & after invasive procedures  Generic Name Brand Name Pre-procedure: Stop medication for this amount of time before your procedure: Post-procedure: Wait this amount of time after the procedure before restarting your medication:  Abciximab Reopro 15 days 2 hrs  Alteplase Activase 10 days 10 days  Anagrelide Agrylin    Apixaban Eliquis 3 days 6 hrs  Cilostazol Pletal 3 days 5 hrs  Clopidogrel Plavix 7-10 days 2 hrs  Dabigatran Pradaxa 5 days 6 hrs  Dalteparin Fragmin 24 hours 4 hrs  Dipyridamole Aggrenox 11days 2 hrs  Edoxaban Lixiana; Savaysa 3 days 2 hrs  Enoxaparin  Lovenox 24 hours 4 hrs  Eptifibatide Integrillin 8 hours 2 hrs  Fondaparinux  Arixtra 72 hours 12 hrs  Hydroxychloroquine Plaquenil 11 days   Prasugrel Effient 7-10 days 6 hrs  Reteplase Retavase 10 days 10 days  Rivaroxaban  Xarelto  3 days 6 hrs  Ticagrelor Brilinta 5-7 days 6 hrs  Ticlopidine Ticlid 10-14 days 2 hrs  Tinzaparin Innohep 24 hours 4 hrs  Tirofiban Aggrastat 8 hours 2 hrs  Warfarin Coumadin  5 days 2 hrs   Other medications with blood-thinning effects  NOTE: Consider stopping these if you have prolonged bleeding despite not taking any of the above blood thinners. Otherwise ask your provider and this will be decided on a case-by-case basis.  Product indications Generic (Brand) names Note  Cholesterol Lipitor Stop 4 days before procedure  Blood thinner (injectable) Heparin  (LMW or LMWH Heparin ) Stop 24 hours before procedure  Cancer Ibrutinib (Imbruvica) Stop 7 days before procedure  Malaria/Rheumatoid Hydroxychloroquine (Plaquenil) Stop 11 days before procedure  Thrombolytics  10 days before or after procedures    Over-the-counter (OTC) Products with blood-thinning effects  NOTE: Consider stopping these if you have prolonged bleeding despite not taking any of the above blood thinners. Otherwise ask your provider and this will be decided on a case-by-case basis.  Product Common names Stop Time  Aspirin > 325 mg Goody Powders, Excedrin, etc. 11 days  Aspirin <= 81 mg  7 days  Fish oil  4 days  Garlic supplements  7 days  Ginkgo biloba  36 hours  Ginseng  24 hours  NSAIDs Ibuprofen, Naprosyn , etc. 3 days  Vitamin E  4 days   ______________________________________________________________________     ______________________________________________________________________    Procedure instructions  Stop blood-thinners  Do not eat or drink fluids (other than water) for 6 hours before your procedure  No water for 2 hours before your procedure  Take your blood pressure medicine with a sip of water  Arrive 30 minutes before your appointment  If sedation is planned, bring suitable driver. [place], [place], & public transportation are NOT APPROVED)  Carefully read the Preparing for your procedure detailed instructions  If you have questions call us  at [phone]  Procedure appointments are for procedures only.   NO medication refills or new problem evaluations will be done on procedure days.   Only the scheduled, pre-approved procedure and side will be done.   ______________________________________________________________________     ______________________________________________________________________    Preparing for your procedure  Appointments: If you think you may not be able to keep your appointment, call 24-48 hours in advance to cancel. We need time to make it available to others.  Procedure visits are for procedures only. During your procedure appointment there will be: NO Prescription Refills*. NO medication changes or discussions*. NO discussion of disability  issues*. NO unrelated pain problem evaluations*. NO evaluations to order other pain procedures*. *These will be addressed at a separate and distinct evaluation encounter on the provider's evaluation schedule and not during procedure days.  Instructions: Food intake: Avoid eating anything solid for at least 8 hours prior to your procedure. Clear liquid intake: You may take clear liquids such as water up to 2 hours prior to your procedure. (No carbonated drinks. No soda.) Transportation: Unless otherwise stated by your physician, bring a driver. (Driver cannot be a Market researcher, Pharmacist, community, or any other form of public transportation.) Morning Medicines: Except for blood thinners, take all of your other morning medications with a sip of water. Make sure to take your heart and blood pressure medicines. If your blood pressure's lower number is above 100, the case will be rescheduled. Blood thinners: Make sure to stop your blood thinners as instructed.  If you take a blood thinner, but were not instructed to stop it, call our office [phone] and ask to talk to a nurse. Not stopping a blood thinner prior to certain procedures could lead to serious complications. Diabetics on insulin: Notify the staff so that you can be scheduled 1st case in the morning. If your diabetes requires high dose insulin, take only  of your normal insulin dose the morning of the procedure and notify the staff that you have done so. Preventing infections: Shower with an antibacterial soap the morning of your procedure.  Build-up your immune system: Take 1000 mg of Vitamin C with every meal (3 times a day) the day prior to your procedure. Antibiotics: Inform the nursing staff if you are taking any antibiotics or if you have any conditions that may require antibiotics prior to procedures. (Example: recent joint implants)   Pregnancy: If you are pregnant make sure to notify the nursing staff. Not doing so may result in injury to the fetus,  including death.  Sickness: If you have a cold, fever, or any active infections, call and cancel or reschedule your procedure. Receiving steroids while having an infection may result in complications. Arrival: You must be in the facility at least 30 minutes prior to your scheduled procedure. Tardiness: Your scheduled time is also the cutoff time. If you do not arrive at least 15 minutes prior to your procedure, you will be rescheduled.  Children: Do not bring any children with you. Make arrangements to keep them home. Dress appropriately: There is always a possibility that your clothing may get soiled. Avoid long dresses. Valuables: Do not bring any jewelry or valuables.  Reasons to call and reschedule or cancel your procedure: (Following these recommendations will minimize the risk of a serious complication.) Surgeries: Avoid having procedures within 2 weeks of any surgery. (Avoid for 2 weeks before or after any surgery). Flu Shots: Avoid having procedures within 2 weeks of a flu shots or . (Avoid for 2 weeks before or after immunizations). Barium: Avoid having a procedure within 7-10 days after having had a radiological study involving the use of radiological contrast. (Myelograms, Barium swallow or enema study). Heart attacks: Avoid any elective procedures or surgeries for the initial 6 months after a Myocardial Infarction (Heart Attack). Blood thinners: It is imperative that you stop these medications before procedures. Let us  know if you if you take any blood thinner.  Infection: Avoid procedures during or  within two weeks of an infection (including chest colds or gastrointestinal problems). Symptoms associated with infections include: Localized redness, fever, chills, night sweats or profuse sweating, burning sensation when voiding, cough, congestion, stuffiness, runny nose, sore throat, diarrhea, nausea, vomiting, cold or Flu symptoms, recent or current infections. It is specially important if  the infection is over the area that we intend to treat. Heart and lung problems: Symptoms that may suggest an active cardiopulmonary problem include: cough, chest pain, breathing difficulties or shortness of breath, dizziness, ankle swelling, uncontrolled high or unusually low blood pressure, and/or palpitations. If you are experiencing any of these symptoms, cancel your procedure and contact your primary care physician for an evaluation.  Remember:  Regular Business hours are:  Monday to Thursday 8:00 AM to 4:00 PM  Provider's Schedule: Eric Como, MD:  Procedure days: Tuesday and Thursday 7:30 AM to 4:00 PM  Wallie Sherry, MD:  Procedure days: Monday and Wednesday 7:30 AM to 4:00 PM Last  Updated: 09/06/2023 ______________________________________________________________________     ______________________________________________________________________    General Risks and Possible Complications  Patient Responsibilities: It is important that you read this as it is part of your informed consent. It is our duty to inform you of the risks and possible complications associated with treatments offered to you. It is your responsibility as a patient to read this and to ask questions about anything that is not clear or that you believe was not covered in this document.  Patient's Rights: You have the right to refuse treatment. You also have the right to change your mind, even after initially having agreed to have the treatment done. However, under this last option, if you wait until the last second to change your mind, you may be charged for the materials used up to that point.  Introduction: Medicine is not an Visual merchandiser. Everything in Medicine, including the lack of treatment(s), carries the potential for danger, harm, or loss (which is by definition: Risk). In Medicine, a complication is a secondary problem, condition, or disease that can aggravate an already existing one. All  treatments carry the risk of possible complications. The fact that a side effects or complications occurs, does not imply that the treatment was conducted incorrectly. It must be clearly understood that these can happen even when everything is done following the highest safety standards.  No treatment: You can choose not to proceed with the proposed treatment alternative. The "PRO(s)" would include: avoiding the risk of complications associated with the therapy. The "CON(s)" would include: not getting any of the treatment benefits. These benefits fall under one of three categories: diagnostic; therapeutic; and/or palliative. Diagnostic benefits include: getting information which can ultimately lead to improvement of the disease or symptom(s). Therapeutic benefits are those associated with the successful treatment of the disease. Finally, palliative benefits are those related to the decrease of the primary symptoms, without necessarily curing the condition (example: decreasing the pain from a flare-up of a chronic condition, such as incurable terminal cancer).  General Risks and Complications: These are associated to most interventional treatments. They can occur alone, or in combination. They fall under one of the following six (6) categories: no benefit or worsening of symptoms; bleeding; infection; nerve damage; allergic reactions; and/or death. No benefits or worsening of symptoms: In Medicine there are no guarantees, only probabilities. No healthcare provider can ever guarantee that a medical treatment will work, they can only state the probability that it may. Furthermore, there is always the possibility that the  condition may worsen, either directly, or indirectly, as a consequence of the treatment. Bleeding: This is more common if the patient is taking a blood thinner, either prescription or over the counter (example: Goody Powders, Fish oil, Aspirin, Garlic, etc.), or if suffering a condition  associated with impaired coagulation (example: Hemophilia, cirrhosis of the liver, low platelet counts, etc.). However, even if you do not have one on these, it can still happen. If you have any of these conditions, or take one of these drugs, make sure to notify your treating physician. Infection: This is more common in patients with a compromised immune system, either due to disease (example: diabetes, cancer, human immunodeficiency virus [HIV], etc.), or due to medications or treatments (example: therapies used to treat cancer and rheumatological diseases). However, even if you do not have one on these, it can still happen. If you have any of these conditions, or take one of these drugs, make sure to notify your treating physician. Nerve Damage: This is more common when the treatment is an invasive one, but it can also happen with the use of medications, such as those used in the treatment of cancer. The damage can occur to small secondary nerves, or to large primary ones, such as those in the spinal cord and brain. This damage may be temporary or permanent and it may lead to impairments that can range from temporary numbness to permanent paralysis and/or brain death. Allergic Reactions: Any time a substance or material comes in contact with our body, there is the possibility of an allergic reaction. These can range from a mild skin rash (contact dermatitis) to a severe systemic reaction (anaphylactic reaction), which can result in death. Death: In general, any medical intervention can result in death, most of the time due to an unforeseen complication. ______________________________________________________________________      ______________________________________________________________________    Steroid injections  Common steroids for injections Triamcinolone : Used by many sports medicine physicians for large joint and bursal injections, often combined with a local anesthetic like lidocaine . A  study focusing on coccydynia (tailbone pain) found triamcinolone  was more effective than betamethasone, suggesting it may also be preferable for other localized inflammation conditions. Methylprednisolone : A common alternative to triamcinolone  that is also a strong anti-inflammatory. It is available in different formulations, with the acetate suspension being the long-acting option for intra-articular injections. Dexamethasone: This is a non-particulate steroid, meaning it has a lower risk of tissue damage compared to particulate steroids like triamcinolone  and methylprednisolone . While less common for this specific use, it is an option for targeted injections.   Considerations for physicians Particulate vs. non-particulate steroids: Triamcinolone  and methylprednisolone  are particulate, meaning they can clump together. Dexamethasone is non-particulate. Particulate steroids are often preferred for their longer-lasting effects but carry a theoretical higher risk for certain injections (though this is less of a concern in the costochondral joints). Combined injectate: Corticosteroids are typically mixed with a local anesthetic like lidocaine  to provide both immediate pain relief (from the anesthetic) and longer-term inflammation reduction (from the steroid). Imaging guidance: To ensure accurate placement of the needle and medication, physicians may use ultrasound or fluoroscopic guidance for the injection, especially in complex or refractory cases.   Patient guidance Before undergoing a steroid injection, discuss the options with your physician. They will determine the best steroid, dosage, and procedure for your specific case based on factors like: Severity of your condition History of response to other treatments Your overall health status Experience and preference of the physician  Last  Updated: 05/22/2024 ______________________________________________________________________

## 2024-07-23 ENCOUNTER — Ambulatory Visit: Attending: Pain Medicine | Admitting: Pain Medicine

## 2024-07-23 ENCOUNTER — Encounter: Payer: Self-pay | Admitting: Pain Medicine

## 2024-07-23 VITALS — BP 133/47 | HR 75 | Temp 98.4°F | Resp 16 | Ht 70.0 in | Wt 216.0 lb

## 2024-07-23 DIAGNOSIS — Z7901 Long term (current) use of anticoagulants: Secondary | ICD-10-CM | POA: Diagnosis present

## 2024-07-23 DIAGNOSIS — M5126 Other intervertebral disc displacement, lumbar region: Secondary | ICD-10-CM | POA: Insufficient documentation

## 2024-07-23 DIAGNOSIS — M48061 Spinal stenosis, lumbar region without neurogenic claudication: Secondary | ICD-10-CM | POA: Insufficient documentation

## 2024-07-23 DIAGNOSIS — M5417 Radiculopathy, lumbosacral region: Secondary | ICD-10-CM | POA: Diagnosis present

## 2024-07-23 DIAGNOSIS — M5441 Lumbago with sciatica, right side: Secondary | ICD-10-CM | POA: Insufficient documentation

## 2024-07-23 DIAGNOSIS — G8929 Other chronic pain: Secondary | ICD-10-CM | POA: Insufficient documentation

## 2024-07-23 DIAGNOSIS — M79604 Pain in right leg: Secondary | ICD-10-CM | POA: Insufficient documentation

## 2024-07-23 DIAGNOSIS — R937 Abnormal findings on diagnostic imaging of other parts of musculoskeletal system: Secondary | ICD-10-CM | POA: Diagnosis present

## 2024-07-23 DIAGNOSIS — M5416 Radiculopathy, lumbar region: Secondary | ICD-10-CM | POA: Insufficient documentation

## 2024-07-30 ENCOUNTER — Ambulatory Visit: Admitting: Pain Medicine

## 2024-07-31 ENCOUNTER — Telehealth: Payer: Self-pay | Admitting: Neurosurgery

## 2024-07-31 ENCOUNTER — Encounter: Payer: Self-pay | Admitting: Pain Medicine

## 2024-07-31 ENCOUNTER — Ambulatory Visit (HOSPITAL_BASED_OUTPATIENT_CLINIC_OR_DEPARTMENT_OTHER): Admitting: Pain Medicine

## 2024-07-31 ENCOUNTER — Ambulatory Visit
Admission: RE | Admit: 2024-07-31 | Discharge: 2024-07-31 | Disposition: A | Discharge status: Home / Self Care | Source: Ambulatory Visit | Attending: Pain Medicine | Admitting: Pain Medicine

## 2024-07-31 VITALS — BP 134/81 | HR 72 | Temp 98.0°F | Resp 18 | Ht 70.0 in | Wt 216.0 lb

## 2024-07-31 DIAGNOSIS — R937 Abnormal findings on diagnostic imaging of other parts of musculoskeletal system: Secondary | ICD-10-CM

## 2024-07-31 DIAGNOSIS — M5416 Radiculopathy, lumbar region: Secondary | ICD-10-CM | POA: Insufficient documentation

## 2024-07-31 DIAGNOSIS — Z7901 Long term (current) use of anticoagulants: Secondary | ICD-10-CM | POA: Diagnosis present

## 2024-07-31 DIAGNOSIS — M5126 Other intervertebral disc displacement, lumbar region: Secondary | ICD-10-CM | POA: Diagnosis present

## 2024-07-31 DIAGNOSIS — M51361 Other intervertebral disc degeneration, lumbar region with lower extremity pain only: Secondary | ICD-10-CM

## 2024-07-31 DIAGNOSIS — M48061 Spinal stenosis, lumbar region without neurogenic claudication: Secondary | ICD-10-CM

## 2024-07-31 DIAGNOSIS — G8929 Other chronic pain: Secondary | ICD-10-CM | POA: Insufficient documentation

## 2024-07-31 DIAGNOSIS — M5417 Radiculopathy, lumbosacral region: Secondary | ICD-10-CM | POA: Diagnosis present

## 2024-07-31 DIAGNOSIS — M79604 Pain in right leg: Secondary | ICD-10-CM

## 2024-07-31 DIAGNOSIS — M5441 Lumbago with sciatica, right side: Secondary | ICD-10-CM | POA: Insufficient documentation

## 2024-07-31 MED ORDER — SODIUM CHLORIDE 0.9% FLUSH
2.0000 mL | Freq: Once | INTRAVENOUS | Status: AC
  Administered 2024-07-31: 2 mL

## 2024-07-31 MED ORDER — PENTAFLUOROPROP-TETRAFLUOROETH EX AERO
INHALATION_SPRAY | Freq: Once | CUTANEOUS | Status: AC
  Administered 2024-07-31: 30 via TOPICAL

## 2024-07-31 MED ORDER — IOHEXOL 180 MG/ML  SOLN
INTRAMUSCULAR | Status: AC
  Filled 2024-07-31: qty 20

## 2024-07-31 MED ORDER — ROPIVACAINE HCL 2 MG/ML IJ SOLN
INTRAMUSCULAR | Status: AC
Start: 2024-07-31 — End: 2024-07-31
  Filled 2024-07-31: qty 20

## 2024-07-31 MED ORDER — LIDOCAINE HCL 2 % IJ SOLN
INTRAMUSCULAR | Status: AC
  Filled 2024-07-31: qty 20

## 2024-07-31 MED ORDER — LIDOCAINE HCL 2 % IJ SOLN
20.0000 mL | Freq: Once | INTRAMUSCULAR | Status: AC
  Administered 2024-07-31: 400 mg

## 2024-07-31 MED ORDER — IOHEXOL 180 MG/ML  SOLN
10.0000 mL | Freq: Once | INTRAMUSCULAR | Status: AC
  Administered 2024-07-31: 10 mL via INTRATHECAL

## 2024-07-31 MED ORDER — DEXAMETHASONE SOD PHOSPHATE PF 10 MG/ML IJ SOLN
20.0000 mg | Freq: Once | INTRAMUSCULAR | Status: AC
  Administered 2024-07-31: 20 mg

## 2024-07-31 MED ORDER — SODIUM CHLORIDE (PF) 0.9 % IJ SOLN
INTRAMUSCULAR | Status: AC
  Filled 2024-07-31: qty 10

## 2024-07-31 MED ORDER — ROPIVACAINE HCL 2 MG/ML IJ SOLN
2.0000 mL | Freq: Once | INTRAMUSCULAR | Status: AC
  Administered 2024-07-31: 2 mL via EPIDURAL

## 2024-07-31 NOTE — Progress Notes (Signed)
 PROVIDER NOTE: Interpretation of information contained herein should be left to medically-trained personnel. Specific patient instructions are provided elsewhere under Patient Instructions section of medical record. This document was created in part using STT-dictation technology, any transcriptional errors that may result from this process are unintentional.  Patient: Brandon Bright Type: Established DOB: 1946/07/10 MRN: [id number] PCP: Cyrus Selinda Moose, PA-C  Service: Procedure DOS: 07/31/2024 Setting: Ambulatory Location: Ambulatory outpatient facility Delivery: Face-to-face Provider: Eric DELENA Como, MD Specialty: Interventional Pain Management Specialty designation: 09 Location: Outpatient facility Ref. Prov.: Como Eric, MD       Interventional Therapy   Procedure: Lumbar trans-foraminal epidural steroid injection (L-TFESI) #2  Laterality: Right (-RT)  Level: L3 & L4 nerve root(s) Imaging: Fluoroscopy-guided Spinal ([id number]) Anesthesia: Local anesthesia (1-2% Lidocaine ) Anxiolysis: None                 Sedation: No Sedation                       DOS: 07/31/2024  Performed by: Eric DELENA Como, MD  Purpose: Diagnostic/Therapeutic Indications: Lumbar radicular pain severe enough to impact quality of life or function. 1. Chronic lower extremity pain (1ry area of Pain) (Right)   2. Lumbar radiculitis (Right) (L3 & L4)   3. Lumbosacral radiculopathy at L4 (Right)   4. 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 Degeneration of intervertebral disc of lumbar region with lower extremity pain   6. Lumbar disc protrusion (Left: L2-3) (Central: L5-S1)   7. 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  From the lower tip of the scapula down to the tailbone and from flank to flank. Materials:  Tray: Block Needle(s):  Type: Spinal  Gauge (G): 22  Length: 5-in  Qty: 2     H&P (Pre-op Assessment):  Mr. Doublin is a 78 y.o. (year old), male patient, seen today for interventional treatment. He  has a past surgical history that includes ganglion cyst removal (Right); Cataract extraction w/PHACO (Left, 02/04/2016); Colonoscopy; Pilonidal cyst excision; Cardiac catheterization (1999?); Cataract extraction w/PHACO (Right, 03/03/2016); Eye surgery (May and June); Colonoscopy with propofol  (N/A, 06/05/2018); Colonoscopy with propofol  (N/A, 03/21/2023); biopsy (03/21/2023); and polypectomy (03/21/2023). Mr. Sturges has a current medication list which includes the following prescription(s): amlodipine , atorvastatin , cholecalciferol, clopidogrel , gabapentin , hypertonic nasal wash, ipratropium, latanoprost, centrum silver  50+men, multiple vitamins-minerals, potassium chloride , and valsartan -hydrochlorothiazide . His primarily concern today is the Back Pain (Right lower)  Initial Vital Signs:  Pulse/HCG Rate: 72ECG Heart Rate: 85 Temp: 98 F (36.7 C) Resp: 18 BP: 126/67 SpO2: 95 %  BMI: Estimated body mass index is 30.99 kg/m as calculated from the following:   Height as of this encounter: 5' 10 (1.778 m).   Weight as of this encounter: 216 lb (98 kg).  Risk Assessment: Allergies: Reviewed. He is allergic to codeine, vicodin [hydrocodone-acetaminophen], celebrex [celecoxib], and ultram [tramadol].  Allergy Precautions: None required Coagulopathies: Reviewed. None identified.  Blood-thinner therapy: None at this time Active Infection(s): Reviewed. None identified. Mr. Grisby is afebrile  Site Confirmation: Mr. Abdullah was asked to confirm the procedure and laterality before marking the  site Procedure  checklist: Completed Consent: Before the procedure and under the influence of no sedative(s), amnesic(s), or anxiolytics, the patient was informed of the treatment options, risks and possible complications. To fulfill our ethical and legal obligations, as recommended by the American Medical Association's Code of Ethics, I have informed the patient of my clinical impression; the nature and purpose of the treatment or procedure; the risks, benefits, and possible complications of the intervention; the alternatives, including doing nothing; the risk(s) and benefit(s) of the alternative treatment(s) or procedure(s); and the risk(s) and benefit(s) of doing nothing. The patient was provided information about the general risks and possible complications associated with the procedure. These may include, but are not limited to: failure to achieve desired goals, infection, bleeding, organ or nerve damage, allergic reactions, paralysis, and death. In addition, the patient was informed of those risks and complications associated to Spine-related procedures, such as failure to decrease pain; infection (i.e.: Meningitis, epidural or intraspinal abscess); bleeding (i.e.: epidural hematoma, subarachnoid hemorrhage, or any other type of intraspinal or peri-dural bleeding); organ or nerve damage (i.e.: Any type of peripheral nerve, nerve root, or spinal cord injury) with subsequent damage to sensory, motor, and/or autonomic systems, resulting in permanent pain, numbness, and/or weakness of one or several areas of the body; allergic reactions; (i.e.: anaphylactic reaction); and/or death. Furthermore, the patient was informed of those risks and complications associated with the medications. These include, but are not limited to: allergic reactions (i.e.: anaphylactic or anaphylactoid reaction(s)); adrenal axis suppression; blood sugar elevation that in diabetics may result in ketoacidosis or comma; water retention that in patients  with history of congestive heart failure may result in shortness of breath, pulmonary edema, and decompensation with resultant heart failure; weight gain; swelling or edema; medication-induced neural toxicity; particulate matter embolism and blood vessel occlusion with resultant organ, and/or nervous system infarction; and/or aseptic necrosis of one or more joints. Finally, the patient was informed that Medicine is not an exact science; therefore, there is also the possibility of unforeseen or unpredictable risks and/or possible complications that may result in a catastrophic outcome. The patient indicated having understood very clearly. We have given the patient no guarantees and we have made no promises. Enough time was given to the patient to ask questions, all of which were answered to the patient's satisfaction. Mr. Wesby has indicated that he wanted to continue with the procedure. Attestation: I, the ordering provider, attest that I have discussed with the patient the benefits, risks, side-effects, alternatives, likelihood of achieving goals, and potential problems during recovery for the procedure that I have provided informed consent. Date  Time: 07/31/2024 12:54 PM  Pre-Procedure Preparation:  Monitoring: As per clinic protocol. Respiration, ETCO2, SpO2, BP, heart rate and rhythm monitor placed and checked for adequate function Safety Precautions: Patient was assessed for positional comfort and pressure points before starting the procedure. Time-out: I initiated and conducted the Time-out before starting the procedure, as per protocol. The patient was asked to participate by confirming the accuracy of the Time Out information. Verification of the correct person, site, and procedure were performed and confirmed by me, the nursing staff, and the patient. Time-out conducted as per Joint Commission's Universal Protocol (UP.01.01.01). Time: 1318 Start Time: 1318 hrs.  Description/Narrative of  Procedure:          Target: The 6 o'clock position under the pedicle, on the affected side. Region: Posterolateral Lumbosacral Approach: Posterior Percutaneous Paravertebral approach.  Rationale (medical necessity): procedure needed and proper for the  diagnosis and/or treatment of the patient's medical symptoms and needs. Procedural Technique Safety Precautions: Aspiration looking for blood return was conducted prior to all injections. At no point did we inject any substances, as a needle was being advanced. No attempts were made at seeking any paresthesias. Safe injection practices and needle disposal techniques used. Medications properly checked for expiration dates. SDV (single dose vial) medications used. Description of the Procedure: Protocol guidelines were followed. The patient was placed in position over the procedure table. The target area was identified and the area prepped in the usual manner. Skin & deeper tissues infiltrated with local anesthetic. Appropriate amount of time allowed to pass for local anesthetics to take effect. The procedure needles were then advanced to the target area. Proper needle placement secured. Negative aspiration confirmed. Solution injected in intermittent fashion, asking for systemic symptoms every 0.5cc of injectate. The needles were then removed and the area cleansed, making sure to leave some of the prepping solution back to take advantage of its long term bactericidal properties.  Vitals:   07/31/24 1315 07/31/24 1320 07/31/24 1325 07/31/24 1329  BP: (!) 152/93 (!) 152/91 (!) 151/83 134/81  Pulse:      Resp: 19 12 15 18   Temp:      TempSrc:      SpO2: 97% 95% 98% 95%  Weight:      Height:        Start Time: 1318 hrs. End Time: 1328 hrs.  Imaging Guidance (Spinal):          Type of Imaging Technique: Fluoroscopy Guidance (Spinal) Indication(s): Fluoroscopy guidance for needle placement to enhance accuracy in procedures requiring precise needle  localization for targeted delivery of medication in or near specific anatomical locations not easily accessible without such real-time imaging assistance. Exposure Time: Please see nurses notes. Contrast: Before injecting any contrast, we confirmed that the patient did not have an allergy to iodine , shellfish, or radiological contrast. Once satisfactory needle placement was completed at the desired level, radiological contrast was injected. Contrast injected under live fluoroscopy. No contrast complications. See chart for type and volume of contrast used. Fluoroscopic Guidance: I was personally present during the use of fluoroscopy. Tunnel Vision Technique used to obtain the best possible view of the target area. Parallax error corrected before commencing the procedure. Direction-depth-direction technique used to introduce the needle under continuous pulsed fluoroscopy. Once target was reached, antero-posterior, oblique, and lateral fluoroscopic projection used confirm needle placement in all planes. Images permanently stored in EMR.        Interpretation: I personally interpreted the imaging intraoperatively. Adequate needle placement confirmed in multiple planes. Appropriate spread of contrast into desired area was observed. No evidence of afferent or efferent intravascular uptake. No intrathecal or subarachnoid spread observed. Permanent images saved into the patient's record.  Post-operative Assessment:  Post-procedure Vital Signs:  Pulse/HCG Rate: 7280 Temp: 98 F (36.7 C) Resp: 18 BP: 134/81 SpO2: 95 %  EBL: None  Complications: No immediate post-treatment complications observed by team, or reported by patient.  Note: The patient tolerated the entire procedure well. A repeat set of vitals were taken after the procedure and the patient was kept under observation following institutional policy, for this type of procedure. Post-procedural neurological assessment was performed, showing  return to baseline, prior to discharge. The patient was provided with post-procedure discharge instructions, including a section on how to identify potential problems. Should any problems arise concerning this procedure, the patient was given instructions to immediately contact us , at  any time, without hesitation. In any case, we plan to contact the patient by telephone for a follow-up status report regarding this interventional procedure.  Comments:  No additional relevant information.  Plan of Care (POC)  Orders:  Orders Placed This Encounter  Procedures   Lumbar Transforaminal Epidural    Scheduling Instructions:     Laterality: Right-sided     Level(s): L3 & L4     Sedation: With Sedation.     Date: 07/31/2024    Where will this procedure be performed?:   [facility]   DG [facility] C-ARM 1-60 MIN NO REPORT    Intraoperative interpretation by procedural physician at [facility].    Standing Status:   Standing    Number of Occurrences:   1    Reason for exam::   Assistance in needle guidance and placement for procedures requiring needle placement in or near specific anatomical locations not easily accessible without such assistance.   MR LUMBAR SPINE WO CONTRAST    Patient presents with axial pain with possible radicular component. Please assist us  in identifying specific level(s) and laterality of any additional findings such as: 1. Facet (Zygapophyseal) joint DJD (Hypertrophy, space narrowing, subchondral sclerosis, and/or osteophyte formation) 2. DDD and/or IVDD (Loss of disc height, desiccation, gas patterns, osteophytes, endplate sclerosis, or Black disc disease) 3. Pars defects 4. Spondylolisthesis, spondylosis, and/or spondyloarthropathies (include Degree/Grade of displacement in mm) (stability) 5. Vertebral body Fractures (acute/chronic) (state percentage of collapse) 6. Demineralization (osteopenia/osteoporotic) 7. Bone pathology 8. Foraminal narrowing   9. Surgical changes 10. Central, Lateral Recess, and/or Foraminal Stenosis (include AP diameter of stenosis in mm) 11. Surgical changes (hardware type, status, and presence of fibrosis) 12. Modic Type Changes (MRI only) 13. IVDD (Disc bulge, protrusion, herniation, extrusion) (Level, laterality, extent)    Standing Status:   Future    Expiration Date:   10/31/2024    Scheduling Instructions:     Please make sure that the patient understands that this needs to be done as soon as possible. Never have the patient do the imaging just before the next appointment. Inform patient that having the imaging done within the [facility] will expedite the availability of the results and will provide      imaging availability to the requesting physician. In addition inform the patient that the imaging order has an expiration date and will not be renewed if not done within the active period.    What is the patient's sedation requirement?:   No Sedation    Does the patient have a pacemaker or implanted devices?:   No    Preferred imaging location?:   [facility] (table limit-350lbs)    Call Results- Best Contact Number?:   [phone] [place] [facility]    Radiology Contrast Protocol - do NOT remove file path:   [email or web address]   Ambulatory referral to Neurosurgery    Referral Priority:   Routine    Referral Type:   Surgical    Referral Reason:   Specialty Services Required    Requested Specialty:   Neurosurgery    Number of Visits Requested:   1   Informed Consent Details: Physician/Practitioner Attestation; Transcribe to consent form and obtain patient signature    Provider Attestation: I, Quinlin Conant A. Tanya, MD, (Pain Management Specialist), the physician/practitioner, attest that I have discussed with the patient the benefits, risks, side effects, alternatives, likelihood of achieving goals and potential problems  during recovery for  the procedure that I have provided informed consent.    Scheduling Instructions:     Note: Always confirm laterality of pain with Mr. Valenza, before procedure.     Transcribe to consent form and obtain patient signature.    Physician/Practitioner attestation of informed consent for procedure/surgical case:   I, the physician/practitioner, attest that I have discussed with the patient the benefits, risks, side effects, alternatives, likelihood of achieving goals and potential problems during recovery for the procedure that I have provided informed consent.    Procedure:   Diagnostic lumbar transforaminal epidural steroid injection under fluoroscopic guidance. (See notes for level and laterality.)    Physician/Practitioner performing the procedure:   Emersyn Kotarski A. Tanya, MD    Indication/Reason:   Lumbar radiculopathy/radiculitis associated with lumbar stenosis   Care order/instruction: Please confirm that the patient has stopped the Plavix  (Clopidogrel ) x 7-10 days prior to procedure or surgery.    Please confirm that the patient has stopped the Plavix  (Clopidogrel ) x 7-10 days prior to procedure or surgery.    Standing Status:   Standing    Number of Occurrences:   1   Provide equipment / supplies at bedside    Procedure tray: Block Tray (Disposable  single use) Skin infiltration needle: Regular 1.5-in, 25-G, (x1) Block Needle type: Spinal Amount/quantity: 2 Size: Medium (5-inch) Gauge: 22G    Standing Status:   Standing    Number of Occurrences:   1    Specify:   Block Tray   Bleeding precautions    Standing Status:   Standing    Number of Occurrences:   1    Opioid Analgesic:  No opioid analgesics prescribed by our practice.   Medications ordered for procedure: Meds ordered this encounter  Medications   iohexol  (OMNIPAQUE ) 180 MG/ML injection 10 mL    Must be Myelogram-compatible. If not available, you may substitute with a water-soluble, non-ionic,  hypoallergenic, myelogram-compatible radiological contrast medium.   lidocaine  (XYLOCAINE ) 2 % (with pres) injection 400 mg   pentafluoroprop-tetrafluoroeth (GEBAUERS) aerosol   sodium chloride  flush (NS) 0.9 % injection 2 mL    This is for a two (2) level block. Use two (2) syringes and divide content in half.   ropivacaine  (PF) 2 mg/mL (0.2%) (NAROPIN ) injection 2 mL    This is for a two (2) level block. Use two (2) syringes and divide content in half.   dexamethasone  (DECADRON ) injection 20 mg    This is for a two (2) level block. Use two (2) syringes and divide content in half.   Medications administered: We administered iohexol , lidocaine , pentafluoroprop-tetrafluoroeth, sodium chloride  flush, ropivacaine  (PF) 2 mg/mL (0.2%), and dexamethasone .  See the medical record for exact dosing, route, and time of administration.    Interventional Therapies  Risk Factors  Considerations  Medical Comorbidities:  Plavix  Anticoagulation: (Stop: 7-10 days  Restart: 2 hours)  TIAs  HTN  T2NIDDM  (B) Carotid Stenosis  RBBB            Planned  Pending:   Therapeutic right L4 + L3 TFESI    Under consideration:   Therapeutic right L4 TFESI #2    Completed:   Diagnostic/therapeutic right L4 TFESI x2 (04/01/2022, 07/11/2023) (1st:100/100/100/100) (20/20/sitting:100/standing: 10) Diagnostic/therapeutic left IA hip joint injection x2 (12/01/2023) (100/100/100/90-100)    Therapeutic  Palliative (PRN) options:   Palliative/therapeutic left IA hip joint injection #3  Palliative right L4-5 LESI (none done by me)   Completed by other providers:   The patient  indicated having had LESI x4, by Dr. Dannial. (before 2000) (Procedure described as helpful).        Follow-up plan:   Return in about 2 weeks (around 08/14/2024) for (Face2F), (PPE).     Recent Visits Date Type Provider Dept  07/23/24 Office Visit Tanya Glisson, MD [facility]  07/10/24 Procedure visit Tanya Glisson, MD [facility]  06/25/24 Office Visit Tanya Glisson, MD [facility]  06/18/24 Office Visit Tanya Glisson, MD [facility]  06/04/24 Office Visit Tanya Glisson, MD [facility]  05/07/24 Office Visit Tanya Glisson, MD [facility]  Showing recent visits within past 90 days and meeting all other requirements Today's Visits Date Type Provider Dept  07/31/24 Procedure visit Tanya Glisson, MD [facility]  Showing today's visits and meeting all other requirements Future Appointments Date Type Provider Dept  08/27/24 Appointment Tanya Glisson, MD [facility]  Showing future appointments within next 90 days and meeting all other requirements   Disposition: Discharge home  Discharge (Date  Time): 07/31/2024; 1340 hrs.   Primary Care Physician: Cyrus Selinda Moose, PA-C Location: [facility] Note by: Glisson DELENA Tanya, MD (TTS technology used. I apologize for any typographical errors that were not detected and corrected.) Date: 07/31/2024; Time: 2:09 PM  Disclaimer:  Medicine is not an visual merchandiser. The only guarantee in medicine is that nothing is guaranteed. It is important to note that the decision to proceed with this intervention was based on the information collected from the patient. The Data and conclusions were drawn from the patient's questionnaire, the interview, and the physical examination. Because the information was provided in large part by the patient, it cannot be guaranteed that it has not been purposely or unconsciously manipulated. Every effort has been made to obtain as much relevant data as possible for this evaluation. It is important to note that the conclusions that lead to this procedure are derived in large part from the available data. Always take into account that the treatment will also be dependent on availability of resources and  existing treatment guidelines, considered by other Pain Management Practitioners as being common knowledge and practice, at the time of the intervention. For Medico-Legal purposes, it is also important to point out that variation in procedural techniques and pharmacological choices are the acceptable norm. The indications, contraindications, technique, and results of the above procedure should only be interpreted and judged by a Board-Certified Interventional Pain Specialist with extensive familiarity and expertise in the same exact procedure and technique.

## 2024-07-31 NOTE — Telephone Encounter (Signed)
 Left voicemail for scheduling. I am sending this referral to you for evaluation. He is an 78 y/o very nice gentleman with significant Lumbar right foraminal stenosis at L3-4 and L4-5, as well as a central spinal stenosis at L2-3. He does not really like taking any medications but he has been sleeping on a recliner due to his pain. I have done several intermittent laminar as well as foraminal epidural steroid injections and although they give him some temporary benefit, clearly he needs something else done. Please evaluate and let me know if there is anything that you can offer him as a more permanent solution. Thank you in advance. I have also ordered a new lumbar MRI to updates his last one.   OK to sch per dr.y

## 2024-07-31 NOTE — Patient Instructions (Signed)
 ______________________________________________________________________    Blood Thinners  IMPORTANT NOTICE:  If you take any of these, make sure to notify the nursing staff.  Failure to do so may result in serious injury.  Recommended time intervals to stop and restart blood-thinners, before & after invasive procedures  Generic Name Brand Name Pre-procedure: Stop medication for this amount of time before your procedure: Post-procedure: Wait this amount of time after the procedure before restarting your medication:  Abciximab Reopro 15 days 2 hrs  Alteplase Activase 10 days 10 days  Anagrelide Agrylin    Apixaban Eliquis 3 days 6 hrs  Cilostazol Pletal 3 days 5 hrs  Clopidogrel  Plavix  7-10 days 2 hrs  Dabigatran Pradaxa 5 days 6 hrs  Dalteparin Fragmin 24 hours 4 hrs  Dipyridamole Aggrenox 11days 2 hrs  Edoxaban Lixiana; Savaysa 3 days 2 hrs  Enoxaparin   Lovenox  24 hours 4 hrs  Eptifibatide Integrillin 8 hours 2 hrs  Fondaparinux  Arixtra 72 hours 12 hrs  Hydroxychloroquine Plaquenil 11 days   Prasugrel Effient 7-10 days 6 hrs  Reteplase Retavase 10 days 10 days  Rivaroxaban Xarelto 3 days 6 hrs  Ticagrelor Brilinta 5-7 days 6 hrs  Ticlopidine Ticlid 10-14 days 2 hrs  Tinzaparin Innohep 24 hours 4 hrs  Tirofiban Aggrastat 8 hours 2 hrs  Warfarin Coumadin 5 days 2 hrs   Other medications with blood-thinning effects  NOTE: Consider stopping these if you have prolonged bleeding despite not taking any of the above blood thinners. Otherwise ask your provider and this will be decided on a case-by-case basis.  Product indications Generic (Brand) names Note  Cholesterol Lipitor Stop 4 days before procedure  Blood thinner (injectable) Heparin (LMW or LMWH Heparin) Stop 24 hours before procedure  Cancer Ibrutinib (Imbruvica) Stop 7 days before procedure  Malaria/Rheumatoid Hydroxychloroquine (Plaquenil) Stop 11 days before procedure  Thrombolytics  10 days before or after procedures    Over-the-counter (OTC) Products with blood-thinning effects  NOTE: Consider stopping these if you have prolonged bleeding despite not taking any of the above blood thinners. Otherwise ask your provider and this will be decided on a case-by-case basis.  Product Common names Stop Time  Aspirin  > 325 mg Goody Powders, Excedrin, etc. 11 days  Aspirin  <= 81 mg  7 days  Fish oil  4 days  Garlic supplements  7 days  Ginkgo biloba  36 hours  Ginseng  24 hours  NSAIDs Ibuprofen, Naprosyn, etc. 3 days  Vitamin E  4 days   ______________________________________________________________________     ______________________________________________________________________    Post-Procedure Discharge Instructions  INSTRUCTIONS Apply ice:  Purpose: This will minimize any swelling and discomfort after procedure.  When: Day of procedure, as soon as you get home. How: Fill a plastic sandwich bag with crushed ice. Cover it with a small towel and apply to injection site. How long: (15 min on, 15 min off) Apply for 15 minutes then remove x 15 minutes.  Repeat sequence on day of procedure, until you go to bed. Apply heat:  Purpose: To treat any soreness and discomfort from the procedure. When: Starting the next day after the procedure. How: Apply heat to procedure site starting the day following the procedure. How long: May continue to repeat daily, until discomfort goes away. Food intake: Start with clear liquids (like water) and advance to regular food, as tolerated.  Physical activities: Keep activities to a minimum for the first 8 hours after the procedure. After that, then as tolerated. Driving: If you have received  any sedation, be responsible and do not drive. You are not allowed to drive for 24 hours after having sedation. Blood thinner: (Applies only to those taking blood thinners) You may restart your blood thinner 6 hours after your procedure. Insulin: (Applies only to Diabetic patients taking  insulin) As soon as you can eat, you may resume your normal dosing schedule. Infection prevention: Keep procedure site clean and dry. Shower daily and clean area with soap and water.  PAIN DIARY Post-procedure Pain Diary: Extremely important that this be done correctly and accurately. Recorded information will be used to determine the next step in treatment. For the purpose of accuracy, follow these rules: Evaluate only the area treated. Do not report or include pain from an untreated area. For the purpose of this evaluation, ignore all other areas of pain, except for the treated area. After your procedure, avoid taking a long nap and attempting to complete the pain diary after you wake up. Instead, set your alarm clock to go off every hour, on the hour, for the initial 8 hours after the procedure. Document the duration of the numbing medicine, and the relief you are getting from it. Do not go to sleep and attempt to complete it later. It will not be accurate. If you received sedation, it is likely that you were given a medication that may cause amnesia. Because of this, completing the diary at a later time may cause the information to be inaccurate. This information is needed to plan your care. Follow-up appointment: Keep your post-procedure follow-up evaluation appointment after the procedure (usually 2 weeks for most procedures, 6 weeks for radiofrequencies). DO NOT FORGET to bring you pain diary with you.   EXPECT... (What should I expect to see with my procedure?) From numbing medicine (AKA: Local Anesthetics): Numbness or decrease in pain. You may also experience some weakness, which if present, could last for the duration of the local anesthetic. Onset: Full effect within 15 minutes of injected. Duration: It will depend on the type of local anesthetic used. On the average, 1 to 8 hours.  From steroids (Applies only if steroids were used): Decrease in swelling or inflammation. Once inflammation  is improved, relief of the pain will follow. Onset of benefits: Depends on the amount of swelling present. The more swelling, the longer it will take for the benefits to be seen. In some cases, up to 10 days. Duration: Steroids will stay in the system x 2 weeks. Duration of benefits will depend on multiple posibilities including persistent irritating factors. Side-effects: If present, they may typically last 2 weeks (the duration of the steroids). Frequent: Cramps (if they occur, drink Gatorade and take over-the-counter Magnesium 450-500 mg once to twice a day); water retention with temporary weight gain; increases in blood sugar; decreased immune system response; increased appetite. Occasional: Facial flushing (red, warm cheeks); mood swings; menstrual changes. Uncommon: Long-term decrease or suppression of natural hormones; bone thinning. (These are more common with higher doses or more frequent use. This is why we prefer that our patients avoid having any injection therapies in other practices.)  Very Rare: Severe mood changes; psychosis; aseptic necrosis. From procedure: Some discomfort is to be expected once the numbing medicine wears off. This should be minimal if ice and heat are applied as instructed.  CALL IF... (When should I call?) You experience numbness and weakness that gets worse with time, as opposed to wearing off. New onset bowel or bladder incontinence. (Applies only to procedures done in the  spine)  Emergency Numbers: Durning business hours (Monday - Thursday, 8:00 AM - 4:00 PM) (Friday, 9:00 AM - 12:00 Noon): [phone] After hours: [phone] NOTE: If you are having a problem and are unable connect with, or to talk to a provider, then go to your nearest urgent care or emergency department. If the problem is serious and urgent, please call 911. ______________________________________________________________________

## 2024-08-01 ENCOUNTER — Telehealth: Payer: Self-pay

## 2024-08-01 NOTE — Telephone Encounter (Signed)
 Post procedure follow up.  Patient states he is doing good.

## 2024-08-02 ENCOUNTER — Ambulatory Visit: Admitting: Pain Medicine

## 2024-08-02 NOTE — Progress Notes (Signed)
 Referring Physician:  Cyrus Selinda Moose, PA-C [phone] [place],  [place] [address]  Primary Physician:  Cyrus Selinda Moose, PA-C  History of Present Illness: 08/07/2024 [name] is here today with a chief complaint of chronic back and leg pain for follow-up after recent nerve injections.  He had an injection a week ago which has helped him significantly.  He experiences back pain that initially radiated to the front of his right shin, following a 'pop and snap' while turning. A nerve injection on October 14th was ineffective, but a subsequent injection on November 4th significantly reduced his pain to a 1-2 out of 10, with no current back pain. Pain is more pronounced in the front of the leg, stopping below the knee. He sleeps in a recliner due to pain but reports improved walking and daily activities. He has not participated in physical therapy. He is concerned about the impact of riding a lawnmower or tractor on his condition. He takes Plavix  and must discontinue it seven days before injections.    Discussed the use of AI scribe software for clinical note transcription with the patient, who gave verbal consent to proceed.   Bowel/Bladder Dysfunction: none  Conservative measures:  Physical therapy:  has not participated in Multimodal medical therapy including regular antiinflammatories:  gabapentin , prednisone  Injections:  07/31/24: Right L3-4 TF ESI  07/10/24: Right L4 TF ESI 12/01/23: Left Intra-articular hip injection  04/01/22: Right L4 TF ESI 09/09/20: Left Intra-Articular Hip Injection   Past Surgery: no spinal surgeries   Brandon Bright has no symptoms of cervical myelopathy.  The symptoms are causing a significant impact on the patient's life.   I have utilized the care everywhere function in epic to review the outside records available from external health systems.   Review of Systems:  A 10 point review of systems is negative, except for  the pertinent positives and negatives detailed in the HPI.  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   no issue/ DR HESTER   CATARACT EXTRACTION W/PHACO Left 02/04/2016   Procedure: CATARACT EXTRACTION PHACO AND INTRAOCULAR LENS PLACEMENT (IOC) LEFT EYE;  Surgeon: Dene Etienne, MD;  Location: [facility];  Service: Ophthalmology;  Laterality: Left;   CATARACT EXTRACTION W/PHACO Right 03/03/2016   Procedure: CATARACT EXTRACTION PHACO AND INTRAOCULAR LENS PLACEMENT (IOC);  Surgeon: Dene Etienne, MD;  Location: [facility];  Service: Ophthalmology;  Laterality: Right;   COLONOSCOPY     COLONOSCOPY WITH PROPOFOL  N/A 06/05/2018   Procedure: COLONOSCOPY WITH PROPOFOL ;  Surgeon: Viktoria Lamar DASEN, MD;  Location: [facility] ENDOSCOPY;  Service: Endoscopy;  Laterality: N/A;   COLONOSCOPY WITH PROPOFOL  N/A 03/21/2023   Procedure: COLONOSCOPY WITH PROPOFOL ;  Surgeon: Onita,  Elspeth Sharper, DO;  Location: [facility] ENDOSCOPY;  Service: Gastroenterology;  Laterality: N/A;   EYE SURGERY  May and June   bilateral cataract   ganglion cyst removal Right    PILONIDAL CYST EXCISION     POLYPECTOMY  03/21/2023   Procedure: POLYPECTOMY;  Surgeon: Onita Elspeth Sharper, DO;  Location: [facility] ENDOSCOPY;  Service:  Gastroenterology;;    Allergies: Allergies as of 08/07/2024 - Review Complete 08/07/2024  Allergen Reaction Noted   Codeine Swelling and Rash 05/01/2015   Vicodin [hydrocodone-acetaminophen] Swelling 05/01/2015   Celebrex [celecoxib]  07/29/2016   Ultram [tramadol]  09/09/2020    Medications:  Current Outpatient Medications:    amLODipine  (NORVASC ) 10 MG tablet, Take 1 tablet by mouth daily., Disp: , Rfl:    atorvastatin  (LIPITOR) 80 MG tablet, Take 1 tablet (80 mg total) by mouth daily., Disp: 90 tablet, Rfl: 0   clopidogrel  (PLAVIX ) 75 MG tablet, Take 1 tablet by mouth daily., Disp: , Rfl:    gabapentin  (NEURONTIN ) 300 MG capsule, Limit 2-3 tablets by mouth per day or twice per day if tolerated (Patient taking differently: Take 300 mg by mouth 2 (two) times daily. Limit 2-3 tablets by mouth per day or twice per day if tolerated), Disp: 540 capsule, Rfl: 0   Hypertonic Nasal Wash (SINUS RINSE KIT NA), Place into the nose as needed., Disp: , Rfl:    ipratropium (ATROVENT ) 0.06 % nasal spray, Place 2 sprays into both nostrils 4 (four) times daily., Disp: 15 mL, Rfl: 12   latanoprost (XALATAN) 0.005 % ophthalmic solution, Place 1 drop into both eyes at bedtime., Disp: , Rfl:    Multiple Vitamins-Minerals (CENTRUM SILVER  50+MEN) TABS, Take 1 tablet by mouth daily., Disp: , Rfl:    Multiple Vitamins-Minerals (PRESERVISION AREDS PO), Take 1 tablet by mouth 2 (two) times daily. Am and dinner, Disp: , Rfl:    potassium chloride  (KLOR-CON  M) 10 MEQ tablet, Take 1 tablet (10 mEq total) by mouth daily for 7 days., Disp: 7 tablet, Rfl: 0   valsartan -hydrochlorothiazide  (DIOVAN -HCT) 160-12.5 MG tablet, , Disp: , Rfl:   Social History: Social History   Tobacco Use   Smoking status: Never   Smokeless tobacco: Never  Vaping Use   Vaping status: Never Used  Substance Use Topics   Alcohol use: No    Alcohol/week: 0.0 standard drinks of alcohol   Drug use: No    Family Medical History: Family  History  Problem Relation Age of Onset   Heart disease Father    Dementia Mother    Heart disease Brother    Heart disease Brother    Diabetes Brother     Physical Examination: Vitals:   08/07/24 1443  BP: (!) 140/82    General: Patient is in no apparent distress. Attention to examination is appropriate.  Neck:   Supple.  Full range of motion.  Respiratory: Patient is breathing without any difficulty.   NEUROLOGICAL:     Awake, alert, oriented to person, place, and time.  Speech is clear and fluent.   Cranial Nerves: Pupils equal round and reactive to light.  Facial tone is symmetric.  Facial sensation is symmetric. Shoulder shrug is symmetric. Tongue protrusion is midline.  There is no pronator drift.  Strength: Side Biceps Triceps Deltoid Interossei Grip Wrist Ext. Wrist Flex.  R 5 5 5 5 5 5 5   L 5 5 5 5 5 5 5    Side Iliopsoas Quads Hamstring PF DF EHL  R 5 5 5  5  5 5  L 5 5 5 5 5 5    Reflexes are 1+ and symmetric at the biceps, triceps, brachioradialis, patella and achilles.   Hoffman's is absent.   Bilateral upper and lower extremity sensation is intact to light touch.    No evidence of dysmetria noted.  Gait is normal.     Medical Decision Making  Imaging: No new imaging to review.  I reviewed his old MRI scan from 2023.  I have personally reviewed the images and agree with the above interpretation.  Assessment and Plan: [name] is a pleasant 78 y.o. male with right sided radiculopathy that is currently improved.  He had an epidural injection last week.  I will make a note to contact him in 2 weeks.  If his pain is returning, we will send him for physical therapy and order an MRI scan at that time.  I spent a total of 30 minutes in this patient's care today. This time was spent reviewing pertinent records including imaging studies, obtaining and confirming history, performing a directed evaluation, formulating and discussing my recommendations, and  documenting the visit within the medical record.      Thank you for involving me in the care of this patient.      Ledonna Dormer K. Clois MD, [facility] Neurosurgery

## 2024-08-07 ENCOUNTER — Ambulatory Visit: Admitting: Neurosurgery

## 2024-08-07 ENCOUNTER — Encounter: Payer: Self-pay | Admitting: Neurosurgery

## 2024-08-07 VITALS — BP 140/82 | Ht 70.0 in | Wt 222.0 lb

## 2024-08-07 DIAGNOSIS — M5416 Radiculopathy, lumbar region: Secondary | ICD-10-CM

## 2024-08-16 ENCOUNTER — Ambulatory Visit
Admission: RE | Admit: 2024-08-16 | Discharge: 2024-08-16 | Disposition: A | Discharge status: Home / Self Care | Source: Ambulatory Visit | Attending: Pain Medicine | Admitting: Pain Medicine

## 2024-08-16 DIAGNOSIS — R937 Abnormal findings on diagnostic imaging of other parts of musculoskeletal system: Secondary | ICD-10-CM | POA: Insufficient documentation

## 2024-08-16 DIAGNOSIS — M51361 Other intervertebral disc degeneration, lumbar region with lower extremity pain only: Secondary | ICD-10-CM | POA: Insufficient documentation

## 2024-08-16 DIAGNOSIS — M5416 Radiculopathy, lumbar region: Secondary | ICD-10-CM | POA: Insufficient documentation

## 2024-08-16 DIAGNOSIS — M48061 Spinal stenosis, lumbar region without neurogenic claudication: Secondary | ICD-10-CM | POA: Diagnosis present

## 2024-08-16 DIAGNOSIS — G8929 Other chronic pain: Secondary | ICD-10-CM | POA: Diagnosis present

## 2024-08-16 DIAGNOSIS — M5417 Radiculopathy, lumbosacral region: Secondary | ICD-10-CM | POA: Diagnosis present

## 2024-08-16 DIAGNOSIS — M5126 Other intervertebral disc displacement, lumbar region: Secondary | ICD-10-CM | POA: Insufficient documentation

## 2024-08-16 DIAGNOSIS — M5441 Lumbago with sciatica, right side: Secondary | ICD-10-CM | POA: Insufficient documentation

## 2024-08-16 DIAGNOSIS — M79604 Pain in right leg: Secondary | ICD-10-CM | POA: Insufficient documentation

## 2024-08-16 DIAGNOSIS — Z7901 Long term (current) use of anticoagulants: Secondary | ICD-10-CM | POA: Diagnosis present

## 2024-08-21 ENCOUNTER — Telehealth: Payer: Self-pay

## 2024-08-21 NOTE — Telephone Encounter (Signed)
-----   Message from [facility] sent at 08/07/2024  3:13 PM EST ----- Please check to see if he is still doing well from his injection. Had RLE pain

## 2024-08-21 NOTE — Telephone Encounter (Signed)
 Patient states he is doing pretty well with his RLE-has not had pain like he did before the injection. Occasionally gets pain in the Right sheen area but it does not last long. He is still sleeping in the recliner not able to sleep in the bed. He is able to do some work, house chores, and walk. His lower back is still getting achy at times and gets tired. He is not taking anything for the back pain

## 2024-08-26 NOTE — Progress Notes (Unsigned)
 PROVIDER NOTE: Interpretation of information contained herein should be left to medically-trained personnel. Specific patient instructions are provided elsewhere under Patient Instructions section of medical record. This document was created in part using AI and STT-dictation technology, any transcriptional errors that may result from this process are unintentional.  Patient: Brandon Bright  Service: E/M   PCP: Cyrus Selinda Moose, PA-C  DOB: 01-20-1946  DOS: 08/27/2024  Provider: Eric DELENA Como, MD  MRN: [id number]  Delivery: Face-to-face  Specialty: Interventional Pain Management  Type: Established Patient  Setting: Ambulatory outpatient facility  Specialty designation: 09  Referring Prov.: Cyrus Selinda Moose,*  Location: Outpatient office facility       History of present illness (HPI) Brandon Bright, a 78 y.o. year old male, is here today because of his Chronic pain of right lower extremity [M79.604, G89.29]. Brandon Bright primary complain today is No chief complaint on file.  Pertinent problems: Brandon Bright has DDD (degenerative disc disease), cervical; DDD (degenerative disc disease), lumbar; Lumbar facet syndrome; Cervical facet syndrome; Degenerative arthritis of spine; Tendinitis of wrist; Chronic lower extremity pain (1ry area of Pain) (Right); Chronic pain syndrome; Lumbar spondylosis; Chronic midline low back pain (2ry area of Pain) w/ sciatica (Right); Lumbar discogenic pain syndrome; Chronic neck pain (3ry area of Pain) (Left); Lumbar radiculitis (Right) (L3 & L4); Lumbar foraminal stenosis (Left: L1-2, L2-3, L5-S1) (Right: L3-4, L4-5) (SEVERE: L4-5); Lumbar disc protrusion (Left: L2-3) (Central: L5-S1); Lumbar facet arthropathy (Bilateral); Cervicalgia; Chronic hip pain (Left); Arthralgia of hip (Left); Osteoarthritis of hip (Left); Enthesopathy of hip region (Left); Other bursitis of hip (Left); Lumbosacral radiculopathy at L4 (Right); Abnormal MRI, lumbar spine  (03/22/2022); Chronic pain disorder; and Chronic hip pain (Right) on their pertinent problem list.  Pain Assessment: Severity of   is reported as a  /10. Location:    / . Onset:  . Quality:  . Timing:  . Modifying factor(s):  SABRA Vitals:  vitals were not taken for this visit.  BMI: Estimated body mass index is 31.85 kg/m as calculated from the following:   Height as of 08/07/24: 5' 10 (1.778 m).   Weight as of 08/07/24: 222 lb (100.7 kg).  Last encounter: 07/23/2024. Last procedure: 07/31/2024.  Reason for encounter: post-procedure evaluation and assessment.   Discussed the use of AI scribe software for clinical note transcription with the patient, who gave verbal consent to proceed.  History of Present Illness          Post-Procedure Evaluation   Procedure: Lumbar trans-foraminal epidural steroid injection (L-TFESI) #2  Laterality: Right (-RT)  Level: L3 & L4 nerve root(s) Imaging: Fluoroscopy-guided Spinal ([id number]) Anesthesia: Local anesthesia (1-2% Lidocaine ) Anxiolysis: None                 Sedation: No Sedation                       DOS: 07/31/2024  Performed by: Eric DELENA Como, MD  Purpose: Diagnostic/Therapeutic Indications: Lumbar radicular pain severe enough to impact quality of life or function. 1. Chronic lower extremity pain (1ry area of Pain) (Right)   2. Lumbar radiculitis (Right) (L3 & L4)   3. Lumbosacral radiculopathy at L4 (Right)   4. 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 Degeneration of intervertebral disc of lumbar region with lower extremity pain   6. Lumbar disc protrusion (Left: L2-3) (Central: L5-S1)   7. Lumbar foraminal stenosis (Left: L1-2, L2-3,  L5-S1) (Right: L3-4, L4-5) (SEVERE: L4-5)    Chronic anticoagulation (PLAVIX )    NAS-11 Pain score:   Pre-procedure: 8 /10   Post-procedure: 0-No pain/10     Effectiveness:  Initial hour after procedure:   ***. Subsequent 4-6 hours post-procedure:    ***. Analgesia past initial 6 hours:   ***. Ongoing improvement:  Analgesic:  *** Function:    ***    ROM:    ***    Interpretation: ***  Pharmacotherapy Assessment   Opioid Analgesic:  No opioid analgesics prescribed by our practice.  Monitoring: [place] PMP: PDMP reviewed during this encounter.       Pharmacotherapy: No side-effects or adverse reactions reported. Compliance: No problems identified. Effectiveness: Clinically acceptable.  No notes on file  UDS:  No results found for: SUMMARY  No results found for: CBDTHCR No results found for: D8THCCBX No results found for: D9THCCBX  ROS  Constitutional: Denies any fever or chills Gastrointestinal: No reported hemesis, hematochezia, vomiting, or acute GI distress Musculoskeletal: Denies any acute onset joint swelling, redness, loss of ROM, or weakness Neurological: No reported episodes of acute onset apraxia, aphasia, dysarthria, agnosia, amnesia, paralysis, loss of coordination, or loss of consciousness  Medication Review  Centrum Silver  50+Men, Hypertonic Nasal Wash, Multiple Vitamins-Minerals, amLODipine , atorvastatin , clopidogrel , gabapentin , ipratropium, latanoprost, potassium chloride , and valsartan -hydrochlorothiazide   History Review  Allergy: Brandon Bright is allergic to codeine, vicodin [hydrocodone-acetaminophen], celebrex [celecoxib], and ultram [tramadol]. Drug: Brandon Bright  reports no history of drug use. Alcohol:  reports no history of alcohol use. Tobacco:  reports that he has never smoked. He has never used smokeless tobacco. Social: Brandon Bright  reports that he has never smoked. He has never used smokeless tobacco. He reports that he does not drink alcohol and does not use drugs. 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 Surgical: Brandon Bright  has a past surgical history that includes ganglion cyst removal (Right); Cataract extraction w/PHACO (Left, 02/04/2016); Colonoscopy; Pilonidal cyst excision; Cardiac catheterization (1999?); Cataract extraction w/PHACO (Right, 03/03/2016); Eye surgery (May and June); Colonoscopy with propofol  (N/A, 06/05/2018); Colonoscopy with propofol  (N/A, 03/21/2023); biopsy (03/21/2023); and polypectomy (03/21/2023). Family: family history includes Dementia in his mother; Diabetes in his brother; Heart disease in his brother, brother, and father.  Laboratory Chemistry Profile   Renal Lab Results  Component Value Date   BUN 27 (H) 04/29/2023   CREATININE 0.96 04/29/2023   GFRAA >60 08/17/2014   GFRNONAA >60 04/29/2023    Hepatic Lab Results  Component Value Date   AST 21 04/28/2023   ALT 24 04/28/2023   ALBUMIN 3.8 04/28/2023   ALKPHOS 66 04/28/2023   LIPASE 75 08/16/2014    Electrolytes Lab Results  Component Value Date   NA 141 04/29/2023   K 3.6 04/29/2023   CL 105 04/29/2023   CALCIUM  9.6 04/29/2023   MG 2.2 04/28/2023    Bone No results found for: VD25OH, VD125OH2TOT, CI6874NY7, CI7874NY7, 25OHVITD1, 25OHVITD2, 25OHVITD3, TESTOFREE, TESTOSTERONE  Inflammation (CRP: Acute Phase) (ESR: Chronic Phase) Lab Results  Component Value Date   CRP 2 08/28/2020   ESRSEDRATE 26 08/28/2020         Note: Above Lab results reviewed.  Recent Imaging Review  MR LUMBAR SPINE WO CONTRAST EXAM: MRI LUMBAR SPINE 08/16/2024 12:49:54 PM  TECHNIQUE: Multiplanar multisequence MRI of the  lumbar spine was performed without the administration of intravenous contrast.  COMPARISON: MR Lumbar Spine without contrast 03/20/2022. Lumbar radiographs 03/10/2022.  CLINICAL HISTORY: 78 year old male with low back pain, lumbar radiculopathy, and CNS  evaluation for degenerative disease, tumor, compression, or inflammation.  FINDINGS:  BONES AND ALIGNMENT: Normal lumbar segmentation. Moderate chronic dextroconvex lumbar scoliosis. Stable lordosis since 2023. No significant spondylolisthesis. Diffuse disc space loss. Widespread vacuum disc. Diffuse bulky lumbar endplate osteophytosis. No marrow edema. Intact visible sacrum and SI joints.  SPINAL CORD: The conus terminates normally. Mild degenerative mass effect on the conus medullaris of T12-L1. No conus or visible lower thoracic spinal cord signal abnormality.  SOFT TISSUES: No paraspinal mass. Benign renal parapelvic cysts (no follow up imaging recommended).  Degenerative: T11-T12: Disc space loss, disc bulging. Mild spinal stenosis appears stable. No spinal cord mass effect.  T12-L1: Disc space loss with circumferential disc osteophyte complex. Broad based posterior component. Mild spinal stenosis and mild mass effect on the conus medullaris (series 8 image 3). This level has mildly progressed.  L1-L2: Pronounced chronic disc space loss and circumferential disc osteophyte complex asymmetric to the left. New superimposed left lateral recess caudal disc extrusion (series 8 image 11, estimated disc fragment 7 mm). Mild to moderate facet and ligamentum flavum hypertrophy. Chronic but progressed and severe left lateral recess stenosis of the descending left L2 nerve level. Progressed and moderate overall spinal stenosis (series 8 image 10). Mild neural foraminal stenosis is stable.  L2-L3: Chronic severe disc space loss and bulky circumferential disc osteophyte complex asymmetric to the right. Mild to moderate posterior element hypertrophy. Moderate spinal and right greater than left lateral recess stenosis. Moderate left L2 neural foraminal stenosis. This level is stable.  L3-L4: Chronic disc space loss and bulky circumferential disc osteophyte complex asymmetric to the  right. Moderate facet ligamentum flavum hypertrophy. Severe right lateral recess stenosis (descending right L4 nerve level series 8 image 22). Moderate to severe spinal stenosis (same image). Moderate left and severe right L3 neural foraminal stenosis (series 5 image 6). This level has progressed.  L4-L5: Advanced disc space loss and bulky chronic circumferential disc osteophyte complex asymmetric to the right. Moderate facet and ligamentum flavum hypertrophy. Mild spinal stenosis. Mild left and moderate to severe right lateral recess stenosis (descending right L5 nerve level series 8 image 27). Moderate to severe right L4 neural foraminal stenosis. This level has not significantly changed.  L5-S1: Chronic severe disc space loss and bulky circumferential disc osteophyte complex. Possible developing interbody ankylosis now at this level related to flowing endplate osteophyte (series 5 image 14). Mild to moderate posterior element hypertrophy. No spinal stenosis. Moderate chronic left lateral recess stenosis (descending left S1 nerve level series 8 image 31). Moderate left L5 neural foraminal stenosis. The stenoses at this level are stable.  IMPRESSION: 1. Chronically advanced lumbar disc and endplate degeneration throughout. Underlying chronic Dextroscoliosis. Possible degenerative developing interbody ankylosis now at L5-S1. 2. Since 2023 Progressed and severe left lateral recess and moderate spinal stenosis at L1-L2 due to a new caudal disc extrusion (7 mm). 3. Progressed Severe right lateral recess stenosis (right L4 nerve level) and moderate to severe spinal stenosis at L3-L4. 4. Progressed mild spinal stenosis at T12-L1, including mild mass effect on the conus medullaris, but no conus signal abnormality. 5. Other levels not significantly changed.  Electronically signed by: Helayne Hurst MD 08/20/2024 05:25 AM EST RP Workstation: [id number] Note: Reviewed        Physical Exam   Vitals:  There were no vitals taken for this visit. BMI: Estimated body mass index is 31.85 kg/m as calculated from the following:   Height as of 08/07/24: 5' 10 (1.778 m).   Weight as of 08/07/24: 222 lb (100.7 kg). Ideal: Patient weight not recorded General appearance: Well nourished, well developed, and well hydrated. In no apparent acute distress Mental status: Alert, oriented x 3 (person, place, & time)       Respiratory: No evidence of acute respiratory distress Eyes: PERLA   Assessment   Diagnosis Status  1. Chronic lower extremity pain (1ry area of Pain) (Right)   2. Lumbar radiculitis (Right) (L3 & L4)   3. Lumbosacral radiculopathy at L4 (Right)   4. 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 Postop check    Controlled Controlled Controlled   Updated Problems: No problems updated.  Plan of Care  Problem-specific:  Assessment and Plan            Brandon Bright has a current medication list which includes the following long-term medication(s): amlodipine , atorvastatin , gabapentin , hypertonic nasal wash, ipratropium, potassium chloride , and valsartan -hydrochlorothiazide .  Pharmacotherapy (Medications Ordered): No orders of the defined types were placed in this encounter.  Orders:  No orders of the defined types were placed in this encounter.    Interventional Therapies  Risk Factors  Considerations  Medical Comorbidities:  Plavix  Anticoagulation: (Stop: 7-10 days  Restart: 2 hours)  TIAs  HTN  T2NIDDM  (B) Carotid Stenosis  RBBB            Planned  Pending:   Therapeutic right L4 + L3 TFESI    Under consideration:   Therapeutic right L4 TFESI #2    Completed:   Diagnostic/therapeutic right L4 TFESI x2 (04/01/2022, 07/11/2023) (1st:100/100/100/100) (20/20/sitting:100/standing: 10) Diagnostic/therapeutic left IA hip joint injection x2 (12/01/2023) (100/100/100/90-100)    Therapeutic  Palliative (PRN)  options:   Palliative/therapeutic left IA hip joint injection #3  Palliative right L4-5 LESI (none done by me)   Completed by other providers:   The patient indicated having had LESI x4, by Dr. Dannial. (before 2000) (Procedure described as helpful).       No follow-ups on file.    Recent Visits Date Type Provider Dept  07/31/24 Procedure visit Tanya Glisson, MD [facility]  07/23/24 Office Visit Tanya Glisson, MD [facility]  07/10/24 Procedure visit Tanya Glisson, MD [facility]  06/25/24 Office Visit Tanya Glisson, MD [facility]  06/18/24 Office Visit Tanya Glisson, MD [facility]  06/04/24 Office Visit Tanya Glisson, MD [facility]  Showing recent visits within past 90 days and meeting all other requirements Future Appointments Date Type Provider Dept  08/27/24 Appointment Tanya Glisson, MD [facility]  Showing future appointments within next 90 days and meeting all other requirements  I discussed the assessment and treatment plan with the patient. The patient was provided an opportunity to ask questions and all were answered. The patient agreed with the plan and demonstrated an understanding of the instructions.  Patient advised to call back or seek an in-person evaluation if the symptoms or condition worsens.  Duration of encounter: *** minutes.  Total time on encounter, as per AMA guidelines included both the face-to-face and non-face-to-face time personally spent by the physician and/or other qualified health care professional(s) on the day of the encounter (includes time in activities that require the physician or other qualified health care  professional and does not include time in activities normally performed by clinical staff). Physician's time may include the following activities when performed: Preparing to see the patient (e.g., pre-charting review of records,  searching for previously ordered imaging, lab work, and nerve conduction tests) Review of prior analgesic pharmacotherapies. Reviewing PMP Interpreting ordered tests (e.g., lab work, imaging, nerve conduction tests) Performing post-procedure evaluations, including interpretation of diagnostic procedures Obtaining and/or reviewing separately obtained history Performing a medically appropriate examination and/or evaluation Counseling and educating the patient/family/caregiver Ordering medications, tests, or procedures Referring and communicating with other health care professionals (when not separately reported) Documenting clinical information in the electronic or other health record Independently interpreting results (not separately reported) and communicating results to the patient/ family/caregiver Care coordination (not separately reported)  Note by: Eric DELENA Como, MD (TTS and AI technology used. I apologize for any typographical errors that were not detected and corrected.) Date: 08/27/2024; Time: 10:41 AM

## 2024-08-27 ENCOUNTER — Ambulatory Visit: Attending: Pain Medicine | Admitting: Pain Medicine

## 2024-08-27 VITALS — BP 141/69 | HR 85 | Temp 98.1°F | Resp 16 | Ht 70.0 in | Wt 222.0 lb

## 2024-08-27 DIAGNOSIS — G8929 Other chronic pain: Secondary | ICD-10-CM | POA: Diagnosis present

## 2024-08-27 DIAGNOSIS — Z09 Encounter for follow-up examination after completed treatment for conditions other than malignant neoplasm: Secondary | ICD-10-CM | POA: Insufficient documentation

## 2024-08-27 DIAGNOSIS — M5417 Radiculopathy, lumbosacral region: Secondary | ICD-10-CM | POA: Insufficient documentation

## 2024-08-27 DIAGNOSIS — M5416 Radiculopathy, lumbar region: Secondary | ICD-10-CM | POA: Diagnosis not present

## 2024-08-27 DIAGNOSIS — M79604 Pain in right leg: Secondary | ICD-10-CM | POA: Diagnosis present

## 2024-08-27 DIAGNOSIS — M5441 Lumbago with sciatica, right side: Secondary | ICD-10-CM | POA: Insufficient documentation

## 2024-08-27 NOTE — Patient Instructions (Signed)

## 2024-08-27 NOTE — Progress Notes (Signed)
 Safety precautions to be maintained throughout the outpatient stay will include: orient to surroundings, keep bed in low position, maintain call bell within reach at all times, provide assistance with transfer out of bed and ambulation.

## 2024-08-28 NOTE — Telephone Encounter (Signed)
 Patient advised. Patient also asked Dr Clois to take a look at his MRI lumbar spine that ordered by Dr Tanya on 08/20/2024, he had a follow up with them yesterday. His pain is finally improving after recent ESI injection. He wanted to know if we had any feedback or recommendations based on this scan.

## 2024-08-29 ENCOUNTER — Other Ambulatory Visit: Payer: Self-pay | Admitting: Family Medicine

## 2024-08-29 DIAGNOSIS — R2231 Localized swelling, mass and lump, right upper limb: Secondary | ICD-10-CM

## 2024-08-30 ENCOUNTER — Ambulatory Visit: Admission: RE | Admit: 2024-08-30 | Discharge: 2024-08-30 | Attending: Family Medicine | Admitting: Family Medicine

## 2024-08-30 DIAGNOSIS — R2231 Localized swelling, mass and lump, right upper limb: Secondary | ICD-10-CM | POA: Insufficient documentation

## 2024-09-03 NOTE — Telephone Encounter (Signed)
 Patient advised. He is doing great with his back at this time and does not want to add any other therapy but will keep us  posted as needed

## 2024-09-04 DIAGNOSIS — R937 Abnormal findings on diagnostic imaging of other parts of musculoskeletal system: Secondary | ICD-10-CM | POA: Insufficient documentation

## 2024-09-04 NOTE — Progress Notes (Unsigned)
 PROVIDER NOTE: Interpretation of information contained herein should be left to medically-trained personnel. Specific patient instructions are provided elsewhere under Patient Instructions section of medical record. This document was created in part using AI and STT-dictation technology, any transcriptional errors that may result from this process are unintentional.  Patient: Brandon Bright  Service: E/M   PCP: Cyrus Selinda Moose, PA-C  DOB: [birth year]  DOS: 09/05/2024  Provider: Eric DELENA Como, MD  MRN: [id number]  Delivery: Face-to-face  Specialty: Interventional Pain Management  Type: Established Patient  Setting: Ambulatory outpatient facility  Specialty designation: 09  Referring Prov.: Cyrus Selinda Moose,*  Location: Outpatient office facility       History of present illness (HPI) Brandon Bright, a 78 y.o. year old male, is here today because of his Cervicalgia [M54.2]. Brandon Bright primary complain today is No chief complaint on file.  Pertinent problems: Brandon Bright has DDD (degenerative disc disease), cervical; DDD (degenerative disc disease), lumbar; Lumbar facet syndrome; Cervical facet syndrome; Degenerative arthritis of spine; Tendinitis of wrist; Chronic lower extremity pain (1ry area of Pain) (Right); Chronic pain syndrome; Lumbar spondylosis; Chronic midline low back pain (2ry area of Pain) w/ sciatica (Right); Lumbar discogenic pain syndrome; Chronic neck pain (3ry area of Pain) (Left); Lumbar radiculitis (Right) (L3 & L4); Lumbar foraminal stenosis (Left: L1-2, L2-3, L5-S1) (Right: L3-4, L4-5) (SEVERE: L4-5); Lumbar disc protrusion (Left: L2-3) (Central: L5-S1); Lumbar facet arthropathy (Bilateral); Cervicalgia; Chronic hip pain (Left); Arthralgia of hip (Left); Osteoarthritis of hip (Left); Enthesopathy of hip region (Left); Other bursitis of hip (Left); Lumbosacral radiculopathy at L4 (Right); Abnormal MRI, lumbar spine (03/22/2022); Chronic pain disorder; Chronic  hip pain (Right); and Abnormal CT scan, cervical spine (05/27/2018) on their pertinent problem list.  Pain Assessment: Severity of   is reported as a  /10. Location:    / . Onset:  . Quality:  . Timing:  . Modifying factor(s):  SABRA Vitals:  vitals were not taken for this visit.  BMI: Estimated body mass index is 31.85 kg/m as calculated from the following:   Height as of 08/27/24: 5' 10 (1.778 m).   Weight as of 08/27/24: 222 lb (100.7 kg).  Last encounter: 08/27/2024. Last procedure: 07/31/2024.  Reason for encounter: evaluation of worsening, or previously known (established) problem.   Discussed the use of AI scribe software for clinical note transcription with the patient, who gave verbal consent to proceed.  History of Present Illness           Pharmacotherapy Assessment   Opioid Analgesic:  No opioid analgesics prescribed by our practice.  Monitoring: [place] PMP: PDMP not reviewed this encounter.       Pharmacotherapy: No side-effects or adverse reactions reported. Compliance: No problems identified. Effectiveness: Clinically acceptable.  No notes on file  UDS:  No results found for: SUMMARY  No results found for: CBDTHCR No results found for: D8THCCBX No results found for: D9THCCBX  ROS  Constitutional: Denies any fever or chills Gastrointestinal: No reported hemesis, hematochezia, vomiting, or acute GI distress Musculoskeletal: Denies any acute onset joint swelling, redness, loss of ROM, or weakness Neurological: No reported episodes of acute onset apraxia, aphasia, dysarthria, agnosia, amnesia, paralysis, loss of coordination, or loss of consciousness  Medication Review  Centrum Silver  50+Men, Hypertonic Nasal Wash, Multiple Vitamins-Minerals, amLODipine , atorvastatin , clopidogrel , gabapentin , ipratropium, latanoprost, potassium chloride , and valsartan -hydrochlorothiazide   History Review  Allergy: Brandon Bright is allergic to codeine, vicodin  [hydrocodone-acetaminophen], celebrex [celecoxib], and ultram [tramadol]. Drug: Brandon Bright  reports  no history of drug use. Alcohol:  reports no history of alcohol use. Tobacco:  reports that he has never smoked. He has never used smokeless tobacco. Social: Brandon Bright  reports that he has never smoked. He has never used smokeless tobacco. He reports that he does not drink alcohol and does not use drugs. 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 Surgical: Brandon Bright  has a past surgical history that includes ganglion cyst removal (Right); Cataract extraction w/PHACO (Left, 02/04/2016); Colonoscopy; Pilonidal cyst excision; Cardiac catheterization (1999?); Cataract extraction w/PHACO (Right, 03/03/2016); Eye surgery (May and June); Colonoscopy with propofol  (N/A, 06/05/2018); Colonoscopy with propofol  (N/A, 03/21/2023); biopsy (03/21/2023); and polypectomy (03/21/2023). Family: family history includes Dementia in his mother; Diabetes in his brother; Heart disease in his brother, brother, and father.  Laboratory Chemistry Profile   Renal Lab Results  Component Value Date   BUN 27 (H) 04/29/2023   CREATININE 0.96 04/29/2023   GFRAA >60 08/17/2014   GFRNONAA >60 04/29/2023    Hepatic Lab Results  Component Value Date   AST 21 04/28/2023   ALT 24 04/28/2023   ALBUMIN 3.8 04/28/2023   ALKPHOS 66 04/28/2023   LIPASE 75 08/16/2014    Electrolytes Lab Results  Component Value Date   NA 141 04/29/2023   K 3.6 04/29/2023   CL 105 04/29/2023   CALCIUM  9.6 04/29/2023   MG 2.2 04/28/2023    Bone No results found for: VD25OH, VD125OH2TOT, CI6874NY7, CI7874NY7, 25OHVITD1, 25OHVITD2, 25OHVITD3,  TESTOFREE, TESTOSTERONE  Inflammation (CRP: Acute Phase) (ESR: Chronic Phase) Lab Results  Component Value Date   CRP 2 08/28/2020   ESRSEDRATE 26 08/28/2020         Note: Above Lab results reviewed.  Recent Imaging Review  MR LUMBAR SPINE WO CONTRAST EXAM: MRI LUMBAR SPINE 08/16/2024 12:49:54 PM  TECHNIQUE: Multiplanar multisequence MRI of the lumbar spine was performed without the administration of intravenous contrast.  COMPARISON: MR Lumbar Spine without contrast 03/20/2022. Lumbar radiographs 03/10/2022.  CLINICAL HISTORY: 78 year old male with low back pain, lumbar radiculopathy, and CNS evaluation for degenerative disease, tumor, compression, or inflammation.  FINDINGS:  BONES AND ALIGNMENT: Normal lumbar segmentation. Moderate chronic dextroconvex lumbar scoliosis. Stable lordosis since 2023. No significant spondylolisthesis. Diffuse disc space loss. Widespread vacuum disc. Diffuse bulky lumbar endplate osteophytosis. No marrow edema. Intact visible sacrum and SI joints.  SPINAL CORD: The conus terminates normally. Mild degenerative mass effect on the conus medullaris of T12-L1. No conus or visible lower thoracic spinal cord signal abnormality.  SOFT TISSUES: No paraspinal mass. Benign renal parapelvic cysts (no follow up imaging recommended).  Degenerative: T11-T12: Disc space loss, disc bulging. Mild spinal stenosis appears stable. No spinal cord mass effect.  T12-L1: Disc space loss with circumferential disc osteophyte complex. Broad based posterior component. Mild spinal stenosis and mild mass effect on the conus medullaris (series 8 image 3). This level has mildly progressed.  L1-L2: Pronounced chronic disc space loss and circumferential disc osteophyte complex asymmetric to the left. New superimposed left lateral recess caudal disc extrusion (series 8 image 11, estimated disc fragment 7 mm). Mild to moderate facet and ligamentum flavum  hypertrophy. Chronic but progressed and severe left lateral recess stenosis of the descending left L2 nerve level. Progressed and moderate overall spinal stenosis (series 8 image 10). Mild neural foraminal stenosis is  stable.  L2-L3: Chronic severe disc space loss and bulky circumferential disc osteophyte complex asymmetric to the right. Mild to moderate posterior element hypertrophy. Moderate spinal and right greater than left lateral recess stenosis. Moderate left L2 neural foraminal stenosis. This level is stable.  L3-L4: Chronic disc space loss and bulky circumferential disc osteophyte complex asymmetric to the right. Moderate facet ligamentum flavum hypertrophy. Severe right lateral recess stenosis (descending right L4 nerve level series 8 image 22). Moderate to severe spinal stenosis (same image). Moderate left and severe right L3 neural foraminal stenosis (series 5 image 6). This level has progressed.  L4-L5: Advanced disc space loss and bulky chronic circumferential disc osteophyte complex asymmetric to the right. Moderate facet and ligamentum flavum hypertrophy. Mild spinal stenosis. Mild left and moderate to severe right lateral recess stenosis (descending right L5 nerve level series 8 image 27). Moderate to severe right L4 neural foraminal stenosis. This level has not significantly changed.  L5-S1: Chronic severe disc space loss and bulky circumferential disc osteophyte complex. Possible developing interbody ankylosis now at this level related to flowing endplate osteophyte (series 5 image 14). Mild to moderate posterior element hypertrophy. No spinal stenosis. Moderate chronic left lateral recess stenosis (descending left S1 nerve level series 8 image 31). Moderate left L5 neural foraminal stenosis. The stenoses at this level are stable.  IMPRESSION: 1. Chronically advanced lumbar disc and endplate degeneration throughout. Underlying chronic Dextroscoliosis. Possible  degenerative developing interbody ankylosis now at L5-S1. 2. Since 2023 Progressed and severe left lateral recess and moderate spinal stenosis at L1-L2 due to a new caudal disc extrusion (7 mm). 3. Progressed Severe right lateral recess stenosis (right L4 nerve level) and moderate to severe spinal stenosis at L3-L4. 4. Progressed mild spinal stenosis at T12-L1, including mild mass effect on the conus medullaris, but no conus signal abnormality. 5. Other levels not significantly changed.  Electronically signed by: Helayne Hurst MD 08/20/2024 05:25 AM EST RP Workstation: [id number] Note: Reviewed        Physical Exam  Vitals: There were no vitals taken for this visit. BMI: Estimated body mass index is 31.85 kg/m as calculated from the following:   Height as of 08/27/24: 5' 10 (1.778 m).   Weight as of 08/27/24: 222 lb (100.7 kg). Ideal: Ideal body weight: 73 kg (160 lb 15 oz) Adjusted ideal body weight: 84.1 kg (185 lb 5.8 oz) General appearance: Well nourished, well developed, and well hydrated. In no apparent acute distress Mental status: Alert, oriented x 3 (person, place, & time)       Respiratory: No evidence of acute respiratory distress Eyes: PERLA   Assessment   Diagnosis Status  1. Cervicalgia   2. Cervical facet syndrome   3. Chronic neck pain (3ry area of Pain) (Left)   4. DDD (degenerative disc disease), cervical   5. Abnormal CT scan, cervical spine (05/27/2018)    Controlled Controlled Controlled   Updated Problems: Problem  Abnormal CT scan, cervical spine (05/27/2018)   (05/27/2018) CERVICAL SPINE CT FINDINGS: Alignment: There is loss of cervical lordosis. This may be secondary to splinting, soft tissue injury, or positioning. Otherwise, alignment is normal.   Skull base and vertebrae: No acute fracture. No primary bone lesion or focal pathologic process.   Soft tissues and spinal canal: No prevertebral fluid or swelling. No visible canal hematoma.    DISC LEVELS:  There are degenerative changes in the mid and LOWER cervical spine. Bilateral foraminal narrowing seen at C3-4, C4-5, C5-6.   Upper  chest: Negative.   Other: None   IMPRESSION: 1. Mid and LOWER cervical spine degenerative changes. 2. Loss of lordosis. 3. No acute fracture.     Plan of Care  Problem-specific:  Assessment and Plan            Mr. DAEQUAN KOZMA has a current medication list which includes the following long-term medication(s): amlodipine , atorvastatin , gabapentin , hypertonic nasal wash, ipratropium, potassium chloride , and valsartan -hydrochlorothiazide .  Pharmacotherapy (Medications Ordered): No orders of the defined types were placed in this encounter.  Orders:  No orders of the defined types were placed in this encounter.    Interventional Therapies  Risk Factors  Considerations  Medical Comorbidities:  Plavix  Anticoagulation: (Stop: 7-10 days  Restart: 2 hours)  TIAs  HTN  T2NIDDM  (B) Carotid Stenosis  RBBB            Planned  Pending:      Under consideration:   Therapeutic right L3 & L4 TFESI #4    Completed:   Therapeutic right L4 TFESI x3 (07/31/2024) (100/100/90/90) (LEP: 100%) (LBP: 90%)  Therapeutic right L3 TFESI x1 (07/31/2024) (100/100/90/90) (LEP: 100%) (LBP: 90%)  Diagnostic/therapeutic left IA hip joint injection x2 (12/01/2023) (100/100/100/90-100)    Therapeutic  Palliative (PRN) options:   Palliative/therapeutic left IA hip joint injection #3  Palliative right L4-5 LESI (none done by me)   Completed by other providers:   The patient indicated having had LESI x4, by Dr. Dannial. (before 2000) (Procedure described as helpful).       No follow-ups on file.    Recent Visits Date Type Provider Dept  08/27/24 Office Visit Tanya Glisson, MD [facility]  07/31/24 Procedure visit Tanya Glisson, MD [facility]  07/23/24 Office Visit Tanya Glisson, MD [facility]  07/10/24 Procedure visit Tanya Glisson, MD [facility]  06/25/24 Office Visit Tanya Glisson, MD [facility]  06/18/24 Office Visit Tanya Glisson, MD [facility]  Showing recent visits within past 90 days and meeting all other requirements Future Appointments Date Type Provider Dept  09/05/24 Appointment Tanya Glisson, MD [facility]  Showing future appointments within next 90 days and meeting all other requirements  I discussed the assessment and treatment plan with the patient. The patient was provided an opportunity to ask questions and all were answered. The patient agreed with the plan and demonstrated an understanding of the instructions.  Patient advised to call back or seek an in-person evaluation if the symptoms or condition worsens.  Duration of encounter: *** minutes.  Total time on encounter, as per AMA guidelines included both the face-to-face and non-face-to-face time personally spent by the physician and/or other qualified health care professional(s) on the day of the encounter (includes time in activities that require the physician or other qualified health care professional and does not include time in activities normally performed by clinical staff). Physician's time may include the following activities when performed: Preparing to see the patient (e.g., pre-charting review of records, searching for previously ordered imaging, lab work, and nerve conduction tests) Review of prior analgesic pharmacotherapies. Reviewing PMP Interpreting ordered tests (e.g., lab work, imaging, nerve conduction tests) Performing post-procedure evaluations, including interpretation of diagnostic procedures Obtaining and/or reviewing separately obtained history Performing a medically appropriate examination and/or evaluation Counseling and educating the patient/family/caregiver Ordering medications, tests, or procedures Referring  and communicating with other health care professionals (when not separately reported) Documenting clinical information in the electronic or other health record Independently  interpreting results (not separately reported) and communicating results to the patient/ family/caregiver Care coordination (not separately reported)  Note by: Eric DELENA Como, MD (TTS and AI technology used. I apologize for any typographical errors that were not detected and corrected.) Date: 09/05/2024; Time: 6:21 PM

## 2024-09-05 ENCOUNTER — Ambulatory Visit
Admission: RE | Admit: 2024-09-05 | Discharge: 2024-09-05 | Disposition: A | Discharge status: Home / Self Care | Source: Ambulatory Visit | Attending: Pain Medicine | Admitting: Pain Medicine

## 2024-09-05 ENCOUNTER — Ambulatory Visit: Admitting: Pain Medicine

## 2024-09-05 ENCOUNTER — Encounter: Payer: Self-pay | Admitting: Pain Medicine

## 2024-09-05 VITALS — BP 135/91 | HR 74 | Temp 98.8°F | Resp 16 | Ht 70.0 in | Wt 220.0 lb

## 2024-09-05 DIAGNOSIS — M5382 Other specified dorsopathies, cervical region: Secondary | ICD-10-CM | POA: Insufficient documentation

## 2024-09-05 DIAGNOSIS — M542 Cervicalgia: Secondary | ICD-10-CM

## 2024-09-05 DIAGNOSIS — M47812 Spondylosis without myelopathy or radiculopathy, cervical region: Secondary | ICD-10-CM | POA: Diagnosis present

## 2024-09-05 DIAGNOSIS — G8929 Other chronic pain: Secondary | ICD-10-CM | POA: Diagnosis present

## 2024-09-05 DIAGNOSIS — Z7901 Long term (current) use of anticoagulants: Secondary | ICD-10-CM | POA: Insufficient documentation

## 2024-09-05 DIAGNOSIS — R937 Abnormal findings on diagnostic imaging of other parts of musculoskeletal system: Secondary | ICD-10-CM

## 2024-09-05 DIAGNOSIS — M5030 Other cervical disc degeneration, unspecified cervical region: Secondary | ICD-10-CM | POA: Diagnosis present

## 2024-09-05 MED ORDER — PREDNISONE 20 MG PO TABS: ORAL_TABLET | ORAL | 0 refills | Status: AC

## 2024-09-05 NOTE — Progress Notes (Signed)
 Safety precautions to be maintained throughout the outpatient stay will include: orient to surroundings, keep bed in low position, maintain call bell within reach at all times, provide assistance with transfer out of bed and ambulation.

## 2024-09-05 NOTE — Patient Instructions (Addendum)
 ______________________________________________________________________    Blood Thinners  IMPORTANT NOTICE:  If you take any of these, make sure to notify the nursing staff.  Failure to do so may result in serious injury.  Recommended time intervals to stop and restart blood-thinners, before & after invasive procedures  Generic Name Brand Name Pre-procedure: Stop medication for this amount of time before your procedure: Post-procedure: Wait this amount of time after the procedure before restarting your medication:  Abciximab Reopro 15 days 2 hrs  Alteplase Activase 10 days 10 days  Anagrelide Agrylin    Apixaban Eliquis 3 days 6 hrs  Cilostazol Pletal 3 days 5 hrs  Clopidogrel  Plavix  7-10 days 2 hrs  Dabigatran Pradaxa 5 days 6 hrs  Dalteparin Fragmin 24 hours 4 hrs  Dipyridamole Aggrenox 11days 2 hrs  Edoxaban Lixiana; Savaysa 3 days 2 hrs  Enoxaparin   Lovenox  24 hours 4 hrs  Eptifibatide Integrillin 8 hours 2 hrs  Fondaparinux  Arixtra 72 hours 12 hrs  Hydroxychloroquine Plaquenil 11 days   Prasugrel Effient 7-10 days 6 hrs  Reteplase Retavase 10 days 10 days  Rivaroxaban Xarelto 3 days 6 hrs  Ticagrelor Brilinta 5-7 days 6 hrs  Ticlopidine Ticlid 10-14 days 2 hrs  Tinzaparin Innohep 24 hours 4 hrs  Tirofiban Aggrastat 8 hours 2 hrs  Warfarin Coumadin 5 days 2 hrs   Other medications with blood-thinning effects  NOTE: Consider stopping these if you have prolonged bleeding despite not taking any of the above blood thinners. Otherwise ask your provider and this will be decided on a case-by-case basis.  Product indications Generic (Brand) names Note  Cholesterol Lipitor Stop 4 days before procedure  Blood thinner (injectable) Heparin (LMW or LMWH Heparin) Stop 24 hours before procedure  Cancer Ibrutinib (Imbruvica) Stop 7 days before procedure  Malaria/Rheumatoid Hydroxychloroquine (Plaquenil) Stop 11 days before procedure  Thrombolytics  10 days before or after procedures    Over-the-counter (OTC) Products with blood-thinning effects  NOTE: Consider stopping these if you have prolonged bleeding despite not taking any of the above blood thinners. Otherwise ask your provider and this will be decided on a case-by-case basis.  Product Common names Stop Time  Aspirin  > 325 mg Goody Powders, Excedrin, etc. 11 days  Aspirin  <= 81 mg  7 days  Fish oil  4 days  Garlic supplements  7 days  Ginkgo biloba  36 hours  Ginseng  24 hours  NSAIDs Ibuprofen, Naprosyn, etc. 3 days  Vitamin E  4 days   ______________________________________________________________________     ______________________________________________________________________    Procedure instructions  Stop blood-thinners  Do not eat or drink fluids (other than water) for 6 hours before your procedure  No water for 2 hours before your procedure  Take your blood pressure medicine with a sip of water  Arrive 30 minutes before your appointment  If sedation is planned, bring suitable driver. Nada, Gisele, & public transportation are NOT APPROVED)  Carefully read the Preparing for your procedure detailed instructions  If you have questions call us  at [phone]  Procedure appointments are for procedures only.   NO medication refills or new problem evaluations will be done on procedure days.   Only the scheduled, pre-approved procedure and side will be done.   ______________________________________________________________________     ______________________________________________________________________    Preparing for your procedure  Appointments: If you think you may not be able to keep your appointment, call 24-48 hours in advance to cancel. We need time to make it available to others.  Procedure visits are for procedures only. During your procedure appointment there will be: NO Prescription Refills*. NO medication changes or discussions*. NO discussion of disability  issues*. NO unrelated pain problem evaluations*. NO evaluations to order other pain procedures*. *These will be addressed at a separate and distinct evaluation encounter on the provider's evaluation schedule and not during procedure days.  Instructions: Food intake: Avoid eating anything solid for at least 8 hours prior to your procedure. Clear liquid intake: You may take clear liquids such as water up to 2 hours prior to your procedure. (No carbonated drinks. No soda.) Transportation: Unless otherwise stated by your physician, bring a driver. (Driver cannot be a Market Researcher, Pharmacist, Community, or any other form of public transportation.) Morning Medicines: Except for blood thinners, take all of your other morning medications with a sip of water. Make sure to take your heart and blood pressure medicines. If your blood pressure's lower number is above 100, the case will be rescheduled. Blood thinners: Make sure to stop your blood thinners as instructed.  If you take a blood thinner, but were not instructed to stop it, call our office [phone] and ask to talk to a nurse. Not stopping a blood thinner prior to certain procedures could lead to serious complications. Diabetics on insulin: Notify the staff so that you can be scheduled 1st case in the morning. If your diabetes requires high dose insulin, take only  of your normal insulin dose the morning of the procedure and notify the staff that you have done so. Preventing infections: Shower with an antibacterial soap the morning of your procedure.  Build-up your immune system: Take 1000 mg of Vitamin C with every meal (3 times a day) the day prior to your procedure. Antibiotics: Inform the nursing staff if you are taking any antibiotics or if you have any conditions that may require antibiotics prior to procedures. (Example: recent joint implants)   Pregnancy: If you are pregnant make sure to notify the nursing staff. Not doing so may result in injury to the fetus,  including death.  Sickness: If you have a cold, fever, or any active infections, call and cancel or reschedule your procedure. Receiving steroids while having an infection may result in complications. Arrival: You must be in the facility at least 30 minutes prior to your scheduled procedure. Tardiness: Your scheduled time is also the cutoff time. If you do not arrive at least 15 minutes prior to your procedure, you will be rescheduled.  Children: Do not bring any children with you. Make arrangements to keep them home. Dress appropriately: There is always a possibility that your clothing may get soiled. Avoid long dresses. Valuables: Do not bring any jewelry or valuables.  Reasons to call and reschedule or cancel your procedure: (Following these recommendations will minimize the risk of a serious complication.) Surgeries: Avoid having procedures within 2 weeks of any surgery. (Avoid for 2 weeks before or after any surgery). Flu Shots: Avoid having procedures within 2 weeks of a flu shots or . (Avoid for 2 weeks before or after immunizations). Barium: Avoid having a procedure within 7-10 days after having had a radiological study involving the use of radiological contrast. (Myelograms, Barium swallow or enema study). Heart attacks: Avoid any elective procedures or surgeries for the initial 6 months after a Myocardial Infarction (Heart Attack). Blood thinners: It is imperative that you stop these medications before procedures. Let us  know if you if you take any blood thinner.  Infection: Avoid procedures during or  within two weeks of an infection (including chest colds or gastrointestinal problems). Symptoms associated with infections include: Localized redness, fever, chills, night sweats or profuse sweating, burning sensation when voiding, cough, congestion, stuffiness, runny nose, sore throat, diarrhea, nausea, vomiting, cold or Flu symptoms, recent or current infections. It is specially important if  the infection is over the area that we intend to treat. Heart and lung problems: Symptoms that may suggest an active cardiopulmonary problem include: cough, chest pain, breathing difficulties or shortness of breath, dizziness, ankle swelling, uncontrolled high or unusually low blood pressure, and/or palpitations. If you are experiencing any of these symptoms, cancel your procedure and contact your primary care physician for an evaluation.  Remember:  Regular Business hours are:  Monday to Thursday 8:00 AM to 4:00 PM  Provider's Schedule: Eric Como, MD:  Procedure days: Tuesday and Thursday 7:30 AM to 4:00 PM  Wallie Sherry, MD:  Procedure days: Monday and Wednesday 7:30 AM to 4:00 PM Last  Updated: 09/06/2023 ______________________________________________________________________     ______________________________________________________________________    General Risks and Possible Complications  Patient Responsibilities: It is important that you read this as it is part of your informed consent. It is our duty to inform you of the risks and possible complications associated with treatments offered to you. It is your responsibility as a patient to read this and to ask questions about anything that is not clear or that you believe was not covered in this document.  Patients Rights: You have the right to refuse treatment. You also have the right to change your mind, even after initially having agreed to have the treatment done. However, under this last option, if you wait until the last second to change your mind, you may be charged for the materials used up to that point.  Introduction: Medicine is not an visual merchandiser. Everything in Medicine, including the lack of treatment(s), carries the potential for danger, harm, or loss (which is by definition: Risk). In Medicine, a complication is a secondary problem, condition, or disease that can aggravate an already existing one. All  treatments carry the risk of possible complications. The fact that a side effects or complications occurs, does not imply that the treatment was conducted incorrectly. It must be clearly understood that these can happen even when everything is done following the highest safety standards.  No treatment: You can choose not to proceed with the proposed treatment alternative. The PRO(s) would include: avoiding the risk of complications associated with the therapy. The CON(s) would include: not getting any of the treatment benefits. These benefits fall under one of three categories: diagnostic; therapeutic; and/or palliative. Diagnostic benefits include: getting information which can ultimately lead to improvement of the disease or symptom(s). Therapeutic benefits are those associated with the successful treatment of the disease. Finally, palliative benefits are those related to the decrease of the primary symptoms, without necessarily curing the condition (example: decreasing the pain from a flare-up of a chronic condition, such as incurable terminal cancer).  General Risks and Complications: These are associated to most interventional treatments. They can occur alone, or in combination. They fall under one of the following six (6) categories: no benefit or worsening of symptoms; bleeding; infection; nerve damage; allergic reactions; and/or death. No benefits or worsening of symptoms: In Medicine there are no guarantees, only probabilities. No healthcare provider can ever guarantee that a medical treatment will work, they can only state the probability that it may. Furthermore, there is always the possibility that the  condition may worsen, either directly, or indirectly, as a consequence of the treatment. Bleeding: This is more common if the patient is taking a blood thinner, either prescription or over the counter (example: Goody Powders, Fish oil, Aspirin , Garlic, etc.), or if suffering a condition  associated with impaired coagulation (example: Hemophilia, cirrhosis of the liver, low platelet counts, etc.). However, even if you do not have one on these, it can still happen. If you have any of these conditions, or take one of these drugs, make sure to notify your treating physician. Infection: This is more common in patients with a compromised immune system, either due to disease (example: diabetes, cancer, human immunodeficiency virus [HIV], etc.), or due to medications or treatments (example: therapies used to treat cancer and rheumatological diseases). However, even if you do not have one on these, it can still happen. If you have any of these conditions, or take one of these drugs, make sure to notify your treating physician. Nerve Damage: This is more common when the treatment is an invasive one, but it can also happen with the use of medications, such as those used in the treatment of cancer. The damage can occur to small secondary nerves, or to large primary ones, such as those in the spinal cord and brain. This damage may be temporary or permanent and it may lead to impairments that can range from temporary numbness to permanent paralysis and/or brain death. Allergic Reactions: Any time a substance or material comes in contact with our body, there is the possibility of an allergic reaction. These can range from a mild skin rash (contact dermatitis) to a severe systemic reaction (anaphylactic reaction), which can result in death. Death: In general, any medical intervention can result in death, most of the time due to an unforeseen complication. ______________________________________________________________________      ______________________________________________________________________    Steroid injections  Common steroids for injections Triamcinolone : Used by many sports medicine physicians for large joint and bursal injections, often combined with a local anesthetic like lidocaine . A  study focusing on coccydynia (tailbone pain) found triamcinolone  was more effective than betamethasone, suggesting it may also be preferable for other localized inflammation conditions. Methylprednisolone : A common alternative to triamcinolone  that is also a strong anti-inflammatory. It is available in different formulations, with the acetate suspension being the long-acting option for intra-articular injections. Dexamethasone : This is a non-particulate steroid, meaning it has a lower risk of tissue damage compared to particulate steroids like triamcinolone  and methylprednisolone . While less common for this specific use, it is an option for targeted injections.   Considerations for physicians Particulate vs. non-particulate steroids: Triamcinolone  and methylprednisolone  are particulate, meaning they can clump together. Dexamethasone  is non-particulate. Particulate steroids are often preferred for their longer-lasting effects but carry a theoretical higher risk for certain injections (though this is less of a concern in the costochondral joints). Combined injectate: Corticosteroids are typically mixed with a local anesthetic like lidocaine  to provide both immediate pain relief (from the anesthetic) and longer-term inflammation reduction (from the steroid). Imaging guidance: To ensure accurate placement of the needle and medication, physicians may use ultrasound or fluoroscopic guidance for the injection, especially in complex or refractory cases.   Patient guidance Before undergoing a steroid injection, discuss the options with your physician. They will determine the best steroid, dosage, and procedure for your specific case based on factors like: Severity of your condition History of response to other treatments Your overall health status Experience and preference of the physician  Last  Updated: 05/22/2024 ______________________________________________________________________    ______________________________________________________________________    TENS (Device can be purchased online, without prescription. Search: TENS 7000.) Transcutaneous electrical nerve stimulation (TENS) is a method of pain relief that involves the use of mild electrical stimulation. A TENS machine is a small, battery-operated device that has leads connected to sticky pads called electrodes. Available at [employer]. (Estimated price as of July 10th, 2025.)  (Estimated [employer] cost: $38.88) Rechargeable 9V batteries:  (Estimated [employer] cost: $12.98)  Larger Reusable 2 x 4 TENS Pads/Electrodes:  (Estimated [employer] cost: $9.99)  Total cost: $61.85    ELECTRODE PLACEMENT:   TENS UNIT SAFETY WARNING SHEET and INFORMATION INDICATIONS AND CONTRAINDICTIONS Read the operation manual before using the device. Federal law ([place] ) restricts this device to sale by or on the order of a physician. Observe your physician's precise instructions and let him show you where to apply the electrodes. For a successful therapy, the correct application of the electrodes is an important factor. Carefully write down the settings your physician recommended. Indications for use This device is a prescription device and only for symptomatic relief of chronic intractable pain.  Contraindications:   Any electrode placement that applies current to the carotid sinus (neck) region.   Patients with implanted electronic devices (for example, a pacemaker) or metallic implants should not undertake.   Any electrode placement that causes current to flow transcerebrally (through the head). The use of unit whenever pain symptoms are undiagnosed, unit etiology is determined.   The use of TENS whenever pain syndromes are undiagnosed, until etiology is established.   WARNINGS AND PRECAUTIONS  Warnings:   The device must be kept out of reach of children.   The safety of device for use during pregnancy or delivery has not been established.    Do not place electrodes on front of the throat. This may result in spasms of the laryngeal and pharyngeal muscles.   Do not place the electrodes over the carotid nerve (side of neck below ear).   The device is not effective for pain of central origin (headaches).   The device may interfere with electronic monitoring equipment (such as ECG monitors and ECG alarms).   Electrodes should not be placed over the eyes, in the mouth, or internally.   These devices have no curative value.   TENS devices should be used only under the continued supervision of a physician.   TENS is a symptomatic treatment and as such suppresses the sensation of pain which would otherwise serve as a protective mechanism. Precautions/Adverse Reactions   Isolated cases of skin irritation may occur at the site of electrode placement following long-term application.   Stimulation should be stopped and electrodes removed until the cause of the irritation can be determined.   Effectiveness is highly dependent upon patient selection by a person qualified in the management of pain patients.   If the device treatment becomes ineffective or unpleasant, stimulation should be discontinued until reevaluation by a physician/clinician.   Always turn the device off before applying or removing electrodes.   Skin irritation and electrode burns are potential adverse reactions.  PURPOSE: A Transcutaneous Electrical Nerve Stimulator, or TENS, unit is designed to relieve post-operative, acute and chronic pain. It is used for pain caused by peripheral nerves and not central. TENS units are prescription-only devices.   OPERATION: TENS units work in a couple of ways. The first way they are thought to work is by a method called the [employer]. The [employer] states that  our brains can only handle one stimulus at a time. When you have chronic pain, this pain signal is constantly being sent to your brain and recognized as pain. When an electrical stimulus is added  to the area of pain the body feels this electrical stimulus, and since the brain can only handle one thing at a time, the pain is not transmitted to the brain. The second method thought to be part of TENS unit's success is by way of stimulating our own bodies to release their own natural painkillers. TENS units do not work for everyone and results may vary. Always follow the instructions and warnings in your user's manual.   USE: One of the most important tasks that must be performed is battery maintenance. If you are using a engineering geologist, always fully charge it and fully deplete it before charging it again. These batteries can develop memories and by not performing this charging task correctly, your battery's life can be greatly diminished. If your battery does develop a memory you can help expand the memory by charging for 12 - 13 hours and then completely depleting the battery. Always prepare the skin before applying electrodes. Your skin should be clean and free of any lotions or creams. If you are using electrodes that use conductive gel, apply a small, even layer over the electrode. For carbon, self-adhesive electrodes, apply a drop of water to the electrodes before applying to the skin. The electrodes attach to the lead wires and then the TENS unit. Always grasp the connector and not the cord when inserting or removing. When making adjustments, always make sure the unit's channels (1 and 2) are in the OFF position. The actual settings should be recommended and prescribed by your physician. Medical equipment suppliers don'tset or instruct users as to user settings. When you are using the BURST mode, the unit delivers a series of quick pulses followed by a rest. This cycle repeats itself frequently. Always have channels OFF before changing modes.   For MODULATION mode, the stimulation automatically varies the width of the pulse.   For CONVENTIONAL mode, the stimulation is constant.  After the settings have been fine-tuned, set the timer to 30 or 60 minutes. Your physician should also prescribe the use time. When the lights become dim, it means your batteries should be replaced or recharged.   ACCESSORIES: The electrodes and lead wires can be obtained from your medical equipment supplier. Your medical equipment supplier can set up a recurring delivery to accommodate your needs. Electrodes should be replaced once a month and lead wires once every 6 months.  Video Tutorial [email or web address]    ______________________________________________________________________

## 2024-09-07 ENCOUNTER — Other Ambulatory Visit: Payer: Self-pay | Admitting: Family Medicine

## 2024-09-07 DIAGNOSIS — R2231 Localized swelling, mass and lump, right upper limb: Secondary | ICD-10-CM

## 2024-09-10 ENCOUNTER — Ambulatory Visit
Admission: RE | Admit: 2024-09-10 | Discharge: 2024-09-10 | Disposition: A | Source: Ambulatory Visit | Attending: Family Medicine | Admitting: Family Medicine

## 2024-09-10 DIAGNOSIS — R2231 Localized swelling, mass and lump, right upper limb: Secondary | ICD-10-CM | POA: Diagnosis present

## 2024-09-10 DIAGNOSIS — I2510 Atherosclerotic heart disease of native coronary artery without angina pectoris: Secondary | ICD-10-CM | POA: Diagnosis not present

## 2024-09-10 DIAGNOSIS — K8020 Calculus of gallbladder without cholecystitis without obstruction: Secondary | ICD-10-CM | POA: Insufficient documentation

## 2024-09-10 DIAGNOSIS — I700 Atherosclerosis of aorta: Secondary | ICD-10-CM | POA: Diagnosis not present

## 2024-09-10 MED ORDER — IOHEXOL 300 MG/ML  SOLN
80.0000 mL | Freq: Once | INTRAMUSCULAR | Status: AC | PRN
  Administered 2024-09-10: 09:00:00 80 mL via INTRAVENOUS

## 2024-09-13 ENCOUNTER — Ambulatory Visit
Admission: RE | Admit: 2024-09-13 | Discharge: 2024-09-13 | Disposition: A | Source: Ambulatory Visit | Attending: Pain Medicine | Admitting: Pain Medicine

## 2024-09-13 ENCOUNTER — Ambulatory Visit: Admitting: Pain Medicine

## 2024-09-13 ENCOUNTER — Encounter: Payer: Self-pay | Admitting: Pain Medicine

## 2024-09-13 VITALS — BP 129/78 | HR 60 | Temp 97.9°F | Resp 16 | Ht 70.0 in | Wt 213.0 lb

## 2024-09-13 DIAGNOSIS — R937 Abnormal findings on diagnostic imaging of other parts of musculoskeletal system: Secondary | ICD-10-CM | POA: Diagnosis present

## 2024-09-13 DIAGNOSIS — M5382 Other specified dorsopathies, cervical region: Secondary | ICD-10-CM | POA: Diagnosis present

## 2024-09-13 DIAGNOSIS — M7918 Myalgia, other site: Secondary | ICD-10-CM

## 2024-09-13 DIAGNOSIS — M47812 Spondylosis without myelopathy or radiculopathy, cervical region: Secondary | ICD-10-CM | POA: Diagnosis present

## 2024-09-13 DIAGNOSIS — G8929 Other chronic pain: Secondary | ICD-10-CM | POA: Insufficient documentation

## 2024-09-13 DIAGNOSIS — Z7901 Long term (current) use of anticoagulants: Secondary | ICD-10-CM | POA: Diagnosis present

## 2024-09-13 DIAGNOSIS — M542 Cervicalgia: Secondary | ICD-10-CM | POA: Insufficient documentation

## 2024-09-13 DIAGNOSIS — M5030 Other cervical disc degeneration, unspecified cervical region: Secondary | ICD-10-CM | POA: Diagnosis present

## 2024-09-13 MED ORDER — TRIAMCINOLONE ACETONIDE 40 MG/ML IJ SUSP
40.0000 mg | Freq: Once | INTRAMUSCULAR | Status: AC
  Administered 2024-09-13: 14:00:00 40 mg

## 2024-09-13 MED ORDER — PENTAFLUOROPROP-TETRAFLUOROETH EX AERO: INHALATION_SPRAY | Freq: Once | CUTANEOUS | Status: AC

## 2024-09-13 MED ORDER — FENTANYL CITRATE (PF) 100 MCG/2ML IJ SOLN: 25.0000 ug | INTRAMUSCULAR | Status: DC | PRN

## 2024-09-13 MED ORDER — FENTANYL CITRATE (PF) 100 MCG/2ML IJ SOLN
INTRAMUSCULAR | Status: AC
  Filled 2024-09-13: qty 2

## 2024-09-13 MED ORDER — ROPIVACAINE HCL 2 MG/ML IJ SOLN
4.0000 mL | Freq: Once | INTRAMUSCULAR | Status: AC
  Administered 2024-09-13: 14:00:00 4 mL

## 2024-09-13 MED ORDER — SODIUM CHLORIDE 0.9% FLUSH
1.0000 mL | Freq: Once | INTRAVENOUS | Status: AC
  Administered 2024-09-13: 14:00:00 1 mL

## 2024-09-13 MED ORDER — DEXAMETHASONE SOD PHOSPHATE PF 10 MG/ML IJ SOLN
10.0000 mg | Freq: Once | INTRAMUSCULAR | Status: AC
  Administered 2024-09-13: 14:00:00 10 mg

## 2024-09-13 MED ORDER — MIDAZOLAM HCL 5 MG/5ML IJ SOLN
0.5000 mg | Freq: Once | INTRAMUSCULAR | Status: AC
  Administered 2024-09-13: 14:00:00 2 mg via INTRAVENOUS

## 2024-09-13 MED ORDER — ROPIVACAINE HCL 2 MG/ML IJ SOLN
INTRAMUSCULAR | Status: AC
  Filled 2024-09-13: qty 20

## 2024-09-13 MED ORDER — LIDOCAINE HCL 2 % IJ SOLN
20.0000 mL | Freq: Once | INTRAMUSCULAR | Status: AC
  Administered 2024-09-13: 14:00:00 100 mg

## 2024-09-13 MED ORDER — IOHEXOL 180 MG/ML  SOLN
10.0000 mL | Freq: Once | INTRAMUSCULAR | Status: AC
  Administered 2024-09-13: 14:00:00 10 mL via EPIDURAL

## 2024-09-13 MED ORDER — ROPIVACAINE HCL 2 MG/ML IJ SOLN
1.0000 mL | Freq: Once | INTRAMUSCULAR | Status: AC
  Administered 2024-09-13: 14:00:00 1 mL via EPIDURAL

## 2024-09-13 MED ORDER — MIDAZOLAM HCL 5 MG/5ML IJ SOLN
INTRAMUSCULAR | Status: AC
  Filled 2024-09-13: qty 5

## 2024-09-13 MED ORDER — LIDOCAINE HCL (PF) 2 % IJ SOLN
INTRAMUSCULAR | Status: AC
  Filled 2024-09-13: qty 10

## 2024-09-13 MED ORDER — IOHEXOL 180 MG/ML  SOLN
INTRAMUSCULAR | Status: AC
  Filled 2024-09-13: qty 20

## 2024-09-13 MED ORDER — TRIAMCINOLONE ACETONIDE 40 MG/ML IJ SUSP
INTRAMUSCULAR | Status: AC
  Filled 2024-09-13: qty 1

## 2024-09-13 MED ORDER — SODIUM CHLORIDE (PF) 0.9 % IJ SOLN
INTRAMUSCULAR | Status: AC
  Filled 2024-09-13: qty 10

## 2024-09-13 NOTE — Patient Instructions (Signed)
 ______________________________________________________________________    Blood Thinners  IMPORTANT NOTICE:  If you take any of these, make sure to notify the nursing staff.  Failure to do so may result in serious injury.  Recommended time intervals to stop and restart blood-thinners, before & after invasive procedures  Generic Name Brand Name Pre-procedure: Stop medication for this amount of time before your procedure: Post-procedure: Wait this amount of time after the procedure before restarting your medication:  Abciximab Reopro 15 days 2 hrs  Alteplase Activase 10 days 10 days  Anagrelide Agrylin    Apixaban Eliquis 3 days 6 hrs  Cilostazol Pletal 3 days 5 hrs  Clopidogrel  Plavix  7-10 days 2 hrs  Dabigatran Pradaxa 5 days 6 hrs  Dalteparin Fragmin 24 hours 4 hrs  Dipyridamole Aggrenox 11days 2 hrs  Edoxaban Lixiana; Savaysa 3 days 2 hrs  Enoxaparin   Lovenox  24 hours 4 hrs  Eptifibatide Integrillin 8 hours 2 hrs  Fondaparinux  Arixtra 72 hours 12 hrs  Hydroxychloroquine Plaquenil 11 days   Prasugrel Effient 7-10 days 6 hrs  Reteplase Retavase 10 days 10 days  Rivaroxaban Xarelto 3 days 6 hrs  Ticagrelor Brilinta 5-7 days 6 hrs  Ticlopidine Ticlid 10-14 days 2 hrs  Tinzaparin Innohep 24 hours 4 hrs  Tirofiban Aggrastat 8 hours 2 hrs  Warfarin Coumadin 5 days 2 hrs   Other medications with blood-thinning effects  NOTE: Consider stopping these if you have prolonged bleeding despite not taking any of the above blood thinners. Otherwise ask your provider and this will be decided on a case-by-case basis.  Product indications Generic (Brand) names Note  Cholesterol Lipitor Stop 4 days before procedure  Blood thinner (injectable) Heparin  (LMW or LMWH Heparin ) Stop 24 hours before procedure  Cancer Ibrutinib (Imbruvica) Stop 7 days before procedure  Malaria/Rheumatoid Hydroxychloroquine (Plaquenil) Stop 11 days before procedure  Thrombolytics  10 days before or after procedures    Over-the-counter (OTC) Products with blood-thinning effects  NOTE: Consider stopping these if you have prolonged bleeding despite not taking any of the above blood thinners. Otherwise ask your provider and this will be decided on a case-by-case basis.  Product Common names Stop Time  Aspirin  > 325 mg Goody Powders, Excedrin, etc. 11 days  Aspirin  <= 81 mg  7 days  Fish oil  4 days  Garlic supplements  7 days  Ginkgo biloba  36 hours  Ginseng  24 hours  NSAIDs Ibuprofen , Naprosyn , etc. 3 days  Vitamin E  4 days   ______________________________________________________________________     ______________________________________________________________________    Post-Procedure Discharge Instructions  INSTRUCTIONS Apply ice:  Purpose: This will minimize any swelling and discomfort after procedure.  When: Day of procedure, as soon as you get home. How: Fill a plastic sandwich bag with crushed ice. Cover it with a small towel and apply to injection site. How long: (15 min on, 15 min off) Apply for 15 minutes then remove x 15 minutes.  Repeat sequence on day of procedure, until you go to bed. Apply heat:  Purpose: To treat any soreness and discomfort from the procedure. When: Starting the next day after the procedure. How: Apply heat to procedure site starting the day following the procedure. How long: May continue to repeat daily, until discomfort goes away. Food intake: Start with clear liquids (like water) and advance to regular food, as tolerated.  Physical activities: Keep activities to a minimum for the first 8 hours after the procedure. After that, then as tolerated. Driving: If you have received  any sedation, be responsible and do not drive. You are not allowed to drive for 24 hours after having sedation. Blood thinner: (Applies only to those taking blood thinners) You may restart your blood thinner 6 hours after your procedure. Insulin : (Applies only to Diabetic patients taking  insulin ) As soon as you can eat, you may resume your normal dosing schedule. Infection prevention: Keep procedure site clean and dry. Shower daily and clean area with soap and water.  PAIN DIARY Post-procedure Pain Diary: Extremely important that this be done correctly and accurately. Recorded information will be used to determine the next step in treatment. For the purpose of accuracy, follow these rules: Evaluate only the area treated. Do not report or include pain from an untreated area. For the purpose of this evaluation, ignore all other areas of pain, except for the treated area. After your procedure, avoid taking a long nap and attempting to complete the pain diary after you wake up. Instead, set your alarm clock to go off every hour, on the hour, for the initial 8 hours after the procedure. Document the duration of the numbing medicine, and the relief you are getting from it. Do not go to sleep and attempt to complete it later. It will not be accurate. If you received sedation, it is likely that you were given a medication that may cause amnesia. Because of this, completing the diary at a later time may cause the information to be inaccurate. This information is needed to plan your care. Follow-up appointment: Keep your post-procedure follow-up evaluation appointment after the procedure (usually 2 weeks for most procedures, 6 weeks for radiofrequencies). DO NOT FORGET to bring you pain diary with you.   EXPECT... (What should I expect to see with my procedure?) From numbing medicine (AKA: Local Anesthetics): Numbness or decrease in pain. You may also experience some weakness, which if present, could last for the duration of the local anesthetic. Onset: Full effect within 15 minutes of injected. Duration: It will depend on the type of local anesthetic used. On the average, 1 to 8 hours.  From steroids (Applies only if steroids were used): Decrease in swelling or inflammation. Once inflammation  is improved, relief of the pain will follow. Onset of benefits: Depends on the amount of swelling present. The more swelling, the longer it will take for the benefits to be seen. In some cases, up to 10 days. Duration: Steroids will stay in the system x 2 weeks. Duration of benefits will depend on multiple posibilities including persistent irritating factors. Side-effects: If present, they may typically last 2 weeks (the duration of the steroids). Frequent: Cramps (if they occur, drink Gatorade and take over-the-counter Magnesium  450-500 mg once to twice a day); water retention with temporary weight gain; increases in blood sugar; decreased immune system response; increased appetite. Occasional: Facial flushing (red, warm cheeks); mood swings; menstrual changes. Uncommon: Long-term decrease or suppression of natural hormones; bone thinning. (These are more common with higher doses or more frequent use. This is why we prefer that our patients avoid having any injection therapies in other practices.)  Very Rare: Severe mood changes; psychosis; aseptic necrosis. From procedure: Some discomfort is to be expected once the numbing medicine wears off. This should be minimal if ice and heat are applied as instructed.  CALL IF... (When should I call?) You experience numbness and weakness that gets worse with time, as opposed to wearing off. New onset bowel or bladder incontinence. (Applies only to procedures done in the  spine)  Emergency Numbers: Durning business hours (Monday - Thursday, 8:00 AM - 4:00 PM) (Friday, 9:00 AM - 12:00 Noon): [phone] After hours: [phone] NOTE: If you are having a problem and are unable connect with, or to talk to a provider, then go to your nearest urgent care or emergency department. If the problem is serious and urgent, please call 911. ______________________________________________________________________      ______________________________________________________________________    Steroid injections  Common steroids for injections Triamcinolone : Used by many sports medicine physicians for large joint and bursal injections, often combined with a local anesthetic like lidocaine . A study focusing on coccydynia (tailbone pain) found triamcinolone  was more effective than betamethasone, suggesting it may also be preferable for other localized inflammation conditions. Methylprednisolone : A common alternative to triamcinolone  that is also a strong anti-inflammatory. It is available in different formulations, with the acetate suspension being the long-acting option for intra-articular injections. Dexamethasone : This is a non-particulate steroid, meaning it has a lower risk of tissue damage compared to particulate steroids like triamcinolone  and methylprednisolone . While less common for this specific use, it is an option for targeted injections.   Considerations for physicians Particulate vs. non-particulate steroids: Triamcinolone  and methylprednisolone  are particulate, meaning they can clump together. Dexamethasone  is non-particulate. Particulate steroids are often preferred for their longer-lasting effects but carry a theoretical higher risk for certain injections (though this is less of a concern in the costochondral joints). Combined injectate: Corticosteroids are typically mixed with a local anesthetic like lidocaine  to provide both immediate pain relief (from the anesthetic) and longer-term inflammation reduction (from the steroid). Imaging guidance: To ensure accurate placement of the needle and medication, physicians may use ultrasound or fluoroscopic guidance for the injection, especially in complex or refractory cases.   Patient guidance Before undergoing a steroid injection, discuss the options with your physician. They will determine the best steroid, dosage, and procedure for your specific case  based on factors like: Severity of your condition History of response to other treatments Your overall health status Experience and preference of the physician  Last  Updated: 05/22/2024 ______________________________________________________________________

## 2024-09-13 NOTE — Progress Notes (Signed)
 Safety precautions to be maintained throughout the outpatient stay will include: orient to surroundings, keep bed in low position, maintain call bell within reach at all times, provide assistance with transfer out of bed and ambulation.

## 2024-09-13 NOTE — Progress Notes (Signed)
 PROVIDER NOTE: Interpretation of information contained herein should be left to medically-trained personnel. Specific patient instructions are provided elsewhere under Patient Instructions section of medical record. This document was created in part using STT-dictation technology, any transcriptional errors that may result from this process are unintentional.  Patient: Brandon Bright Type: Established DOB: May 22, 1946 MRN: [id number] PCP: Cyrus Selinda Moose, PA-C  Service: Procedure DOS: 09/13/2024 Setting: Ambulatory Location: Ambulatory outpatient facility Delivery: Face-to-face Provider: Eric DELENA Como, MD Specialty: Interventional Pain Management Specialty designation: 09 Location: Outpatient facility Ref. Prov.: Como Eric, MD       Interventional Therapy    No.1: Cervical Epidural Steroid injection (CESI) (Interlaminar) #1  Laterality: Left (-LT)  Level: C7-T1 DOS: 09/13/2024  Provider: Eric DELENA Como, MD Imaging: Fluoroscopy-guided Spinal ([id number]) Anesthesia: Local anesthesia (1-2% Lidocaine ) Anxiolysis: IV Versed   2.0 mg           Sedation: Minimal Sedation None required. No Fentanyl  administered.          Medical Necessity Purpose: Diagnostic/Therapeutic Rationale (medical necessity): procedure needed and proper for the diagnosis and/or treatment of Mr. Knauff's medical symptoms and needs. Indications: Cervicalgia, cervical radicular pain, degenerative disc disease, severe enough to impact quality of life or function. 1. Cervicalgia   2. Cervical facet syndrome   3. DDD (degenerative disc disease), cervical   4. Abnormal CT scan, cervical spine (05/27/2018)   5. Impaired range of motion of cervical spine   6. Limited active range of motion (AROM) of cervical spine on rotation   7. Painful cervical range of motion    Procedure No.2: Trapezius Trigger Point Injection (Myoneural Block) (1-2 muscle groups)  #1 (w/ steroids)  CPT: 20552 Laterality:  Left (-LT)  Muscle: Trapezius Target: Myoneural mass of trigger point. Region: Neck & upper back Anatomic Surface: Posterior Anatomic Area: Cervical Level: Trapezius  Approach: Percutaneous  Type of procedure: Myoneural injection  Imaging: N/A. Landmark-guided,           Anesthesia: Local anesthesia (1-2% Lidocaine )  Purpose: Diagnostic/Therapeutic Rationale (medical necessity): procedure needed and proper for the diagnosis and/or treatment of Mr. Jelinski's medical symptoms and needs. Indications: Acute neck pain severe enough to impact quality of life and/or function. 1. Cervicalgia   2. Acute neck pain   3. Myofascial pain syndrome, cervical   4. Trigger point with neck pain     NAS-11 Pain score:   Pre-procedure: 8 /10   Post-procedure: 0-No pain/10     Position  Prep  Materials for procedure #1:  Location setting: Procedure suite Position: Prone, on modified reverse trendelenburg to facilitate breathing, with head in head-cradle. Pillows positioned under chest (below chin-level) with cervical spine flexed. Safety Precautions: Patient was assessed for positional comfort and pressure points before starting the procedure. Prepping solution: DuraPrep (Iodine  Povacrylex [0.7% available iodine ] and Isopropyl Alcohol, 74% w/w) Prep Area: Entire  cervicothoracic region Approach: percutaneous, paramedial Intended target: Posterior cervical epidural space Materials Procedure:  Tray: Epidural Needle(s): Epidural (Tuohy) Qty: 1 Length: (90mm) 3.5-inch Gauge: 17G  Position  Prep  Materials for procedure #2  Position: Prone. Patient assisted into a comfortable position. Pressure points checked.  Prep solution: ChloraPrep (2% chlorhexidine gluconate and 70% isopropyl alcohol) The target area was identified and the area prepped in the usual manner.  Prep Area: Entire posterior cervicothoracic area  Materials:   Tray: Block Needle(s):  Type: Regular  Gauge (G): 25  Length:  1.5-in  Qty: 1  H&P (Pre-op Assessment):  Mr. Broady is a 78 y.o. (  year old), male patient, seen today for interventional treatment. He  has a past surgical history that includes ganglion cyst removal (Right); Cataract extraction w/PHACO (Left, 02/04/2016); Colonoscopy; Pilonidal cyst excision; Cardiac catheterization (1999?); Cataract extraction w/PHACO (Right, 03/03/2016); Eye surgery (May and June); Colonoscopy with propofol  (N/A, 06/05/2018); Colonoscopy with propofol  (N/A, 03/21/2023); biopsy (03/21/2023); and polypectomy (03/21/2023). Mr. Reuss has a current medication list which includes the following prescription(s): amlodipine , atorvastatin , clopidogrel , gabapentin , hypertonic nasal wash, ipratropium, latanoprost, centrum silver  50+men, multiple vitamins-minerals, potassium chloride , prednisone , and valsartan -hydrochlorothiazide , and the following Facility-Administered Medications: fentanyl . His primarily concern today is the Neck Pain  Initial Vital Signs:  Pulse/HCG Rate: 60ECG Heart Rate: 78 (nsr) Temp: 98 F (36.7 C) Resp: 18 BP: 128/67 SpO2: 96 %  BMI: Estimated body mass index is 30.56 kg/m as calculated from the following:   Height as of this encounter: 5' 10 (1.778 m).   Weight as of this encounter: 213 lb (96.6 kg).  Risk Assessment: Allergies: Reviewed. He is allergic to codeine, vicodin [hydrocodone-acetaminophen], celebrex [celecoxib], and ultram [tramadol].  Allergy Precautions: None required Coagulopathies: Reviewed. None identified.  Blood-thinner therapy: None at this time Active Infection(s): Reviewed. None identified. Mr. Calcaterra is afebrile  Site Confirmation: Mr. Burtch was asked to confirm the procedure and laterality before marking the site Procedure checklist: Completed Consent: Before the procedure and under the influence of no sedative(s), amnesic(s), or anxiolytics, the patient was informed of the treatment options, risks and possible complications. To  fulfill our ethical and legal obligations, as recommended by the American Medical Association's Code of Ethics, I have informed the patient of my clinical impression; the nature and purpose of the treatment or procedure; the risks, benefits, and possible complications of the intervention; the alternatives, including doing nothing; the risk(s) and benefit(s) of the alternative treatment(s) or procedure(s); and the risk(s) and benefit(s) of doing nothing. The patient was provided information about the general risks and possible complications associated with the procedure. These may include, but are not limited to: failure to achieve desired goals, infection, bleeding, organ or nerve damage, allergic reactions, paralysis, and death. In addition, the patient was informed of those risks and complications associated to Spine-related procedures, such as failure to decrease pain; infection (i.e.: Meningitis, epidural or intraspinal abscess); bleeding (i.e.: epidural hematoma, subarachnoid hemorrhage, or any other type of intraspinal or peri-dural bleeding); organ or nerve damage (i.e.: Any type of peripheral nerve, nerve root, or spinal cord injury) with subsequent damage to sensory, motor, and/or autonomic systems, resulting in permanent pain, numbness, and/or weakness of one or several areas of the body; allergic reactions; (i.e.: anaphylactic reaction); and/or death. Furthermore, the patient was informed of those risks and complications associated with the medications. These include, but are not limited to: allergic reactions (i.e.: anaphylactic or anaphylactoid reaction(s)); adrenal axis suppression; blood sugar elevation that in diabetics may result in ketoacidosis or comma; water retention that in patients with history of congestive heart failure may result in shortness of breath, pulmonary edema, and decompensation with resultant heart failure; weight gain; swelling or edema; medication-induced neural toxicity;  particulate matter embolism and blood vessel occlusion with resultant organ, and/or nervous system infarction; and/or aseptic necrosis of one or more joints. Finally, the patient was informed that Medicine is not an exact science; therefore, there is also the possibility of unforeseen or unpredictable risks and/or possible complications that may result in a catastrophic outcome. The patient indicated having understood very clearly. We have given the patient no guarantees and we have made  no promises. Enough time was given to the patient to ask questions, all of which were answered to the patient's satisfaction. Mr. Caldera has indicated that he wanted to continue with the procedure. Attestation: I, the ordering provider, attest that I have discussed with the patient the benefits, risks, side-effects, alternatives, likelihood of achieving goals, and potential problems during recovery for the procedure that I have provided informed consent. Date  Time: 09/13/2024  1:27 PM  Pre-Procedure Preparation:  Monitoring: As per clinic protocol. Respiration, ETCO2, SpO2, BP, heart rate and rhythm monitor placed and checked for adequate function Safety Precautions: Patient was assessed for positional comfort and pressure points before starting the procedure. Time-out: I initiated and conducted the Time-out before starting the procedure, as per protocol. The patient was asked to participate by confirming the accuracy of the Time Out information. Verification of the correct person, site, and procedure were performed and confirmed by me, the nursing staff, and the patient. Time-out conducted as per Joint Commission's Universal Protocol (UP.01.01.01). Time: 1411 Start Time: 1411 hrs.  Description  Narrative of Procedure for procedure #1:          Rationale (medical necessity): procedure needed and proper for the diagnosis and/or treatment of the patient's medical symptoms and needs. Start Time: 1411  hrs. Safety Precautions: Aspiration looking for blood return was conducted prior to all injections. At no point did we inject any substances, as a needle was being advanced. No attempts were made at seeking any paresthesias. Safe injection practices and needle disposal techniques used. Medications properly checked for expiration dates. SDV (single dose vial) medications used. Description of procedure: Protocol guidelines were followed. The patient was assisted into a comfortable position. The target area was identified and the area prepped in the usual manner. Skin & deeper tissues infiltrated with local anesthetic. Appropriate amount of time allowed to pass for local anesthetics to take effect. Using fluoroscopic guidance, the epidural needle was introduced through the skin, ipsilateral to the reported pain, and advanced to the target area. Posterior laminar os was contacted and the needle walked caudad, until the lamina was cleared. The ligamentum flavum was engaged and the epidural space identified using loss-of-resistance technique with 2-3 ml of PF-NaCl (0.9% NSS), in a 5cc dedicated LOR syringe. (See Imaging guidance below for use of contrast details.) Once proper needle placement was secured, and negative aspiration confirmed, the solution was injected in intermittent fashion, asking for systemic symptoms every 0.5cc. The needles were then removed and the area cleansed, making sure to leave some of the prepping solution back to take advantage of its long term bactericidal properties.  Vitals:   09/13/24 1326 09/13/24 1410 09/13/24 1415 09/13/24 1425  BP: 128/67 (!) 155/95 133/83 129/78  Pulse: 60     Resp: 18 18 17 16   Temp: 98 F (36.7 C)   97.9 F (36.6 C)  TempSrc:    Temporal  SpO2: 96% 99% 100% 95%  Weight: 213 lb (96.6 kg)     Height: 5' 10 (1.778 m)        End Time: 1415 hrs.  Imaging Guidance (Spinal) for procedure #1:  Type of Imaging Technique: Fluoroscopy Guidance  (Spinal) Indication(s): Fluoroscopy guidance for needle placement to enhance accuracy in procedures requiring precise needle localization for targeted delivery of medication in or near specific anatomical locations not easily accessible without such real-time imaging assistance. Exposure Time: Please see nurses notes. Contrast: Before injecting any contrast, we confirmed that the patient did not have an allergy  to iodine , shellfish, or radiological contrast. Once satisfactory needle placement was completed at the desired level, radiological contrast was injected. Contrast injected under live fluoroscopy. No contrast complications. See chart for type and volume of contrast used. Fluoroscopic Guidance: I was personally present during the use of fluoroscopy. Tunnel Vision Technique used to obtain the best possible view of the target area. Parallax error corrected before commencing the procedure. Direction-depth-direction technique used to introduce the needle under continuous pulsed fluoroscopy. Once target was reached, antero-posterior, oblique, and lateral fluoroscopic projection used confirm needle placement in all planes. Images permanently stored in EMR. Interpretation: I personally interpreted the imaging intraoperatively. Adequate needle placement confirmed in multiple planes. Appropriate spread of contrast into desired area was observed. No evidence of afferent or efferent intravascular uptake. No intrathecal or subarachnoid spread observed. Permanent images saved into the patient's record.  Imaging Guidance for Procedure No.2:  Type of Imaging Technique: None used Indication(s): N/A Exposure Time: No patient exposure Contrast: None used. Fluoroscopic Guidance: N/A Ultrasound Guidance: N/A Interpretation: N/A  Post-operative Assessment:  Post-procedure Vital Signs:  Pulse/HCG Rate: 6067 Temp: 97.9 F (36.6 C) Resp: 16 BP: 129/78 SpO2: 95 %  EBL: None  Complications: No immediate  post-treatment complications observed by team, or reported by patient.  Note: The patient tolerated the entire procedure well. A repeat set of vitals were taken after the procedure and the patient was kept under observation following institutional policy, for this type of procedure. Post-procedural neurological assessment was performed, showing return to baseline, prior to discharge. The patient was provided with post-procedure discharge instructions, including a section on how to identify potential problems. Should any problems arise concerning this procedure, the patient was given instructions to immediately contact us , at any time, without hesitation. In any case, we plan to contact the patient by telephone for a follow-up status report regarding this interventional procedure.  Comments:  No additional relevant information.  Plan of Care (POC)  Orders:  Orders Placed This Encounter  Procedures   Cervical Epidural Injection    Indication(s): Radiculitis and cervicalgia associated with cervical degenerative disc disease. Position: Prone Imaging guidance: Fluoroscopy required. Contrast required unless contraindicated by allergy or severe CKD. Equipment & Materials: Epidural tray & needle.    Scheduling Instructions:     Procedure: Cervical Epidural Steroid Injection/Block     Planned Level(s): C7-T1     Laterality: Left-sided     Anxiolysis: Patient's choice.     Date: 09/13/2024    Where will this procedure be performed?:   [facility]             by Dr. Tanya ACE POINT INJECTION    Scheduling Instructions:     Area: Upper Back     Side: Left     Sedation: Patient's choice.     Timeframe: 09/13/2024    Where will this procedure be performed?:   [facility]   DG PA[facility] C-ARM 1-60 MIN NO REPORT    Intraoperative interpretation by procedural physician at [facility].    Standing Status:   Standing    Number of Occurrences:   1    Reason for  exam::   Assistance in needle guidance and placement for procedures requiring needle placement in or near specific anatomical locations not easily accessible without such assistance.   Informed Consent Details: Physician/Practitioner Attestation; Transcribe to consent form and obtain patient signature    Nursing instructions: Transcribe to consent form and obtain patient signature.  Always confirm laterality of pain with Mr. Ratajczak, before procedure.    Physician/Practitioner attestation of informed consent for procedure/surgical case:   I, the physician/practitioner, attest that I have discussed with the patient the benefits, risks, side effects, alternatives, likelihood of achieving goals and potential problems during recovery for the procedure that I have provided informed consent.    Procedure:   Cervical Epidural Steroid Injection (CESI) under fluoroscopic guidance    Physician/Practitioner performing the procedure:   Avana Kreiser A. Tanya MD    Indication/Reason:   Indications: Cervicalgia (neck pain), cervical radicular pain, radiculitis (arm/shoulder pain, numbness, and/or weakness), degenerative disc disease, severe enough to greatly impact quality of life or function.   Care order/instruction: Please confirm that the patient has stopped the Plavix  (Clopidogrel ) x 7-10 days prior to procedure or surgery.    Please confirm that the patient has stopped the Plavix  (Clopidogrel ) x 7-10 days prior to procedure or surgery.    Standing Status:   Standing    Number of Occurrences:   1   Provide equipment / supplies at bedside    Procedural tray: Epidural Tray (Disposable  single use) Skin infiltration needle: Regular 1.5-in, 25-G, (x1) Block needle size: Regular standard Catheter: No catheter required    Standing Status:   Standing    Number of Occurrences:   1    Specify:   Epidural Tray   Informed Consent Details: Physician/Practitioner Attestation; Transcribe to consent form and obtain patient  signature    Provider Attestation: I, Blong Busk A. Tanya, MD, (Pain Management Specialist), the physician/practitioner, attest that I have discussed with the patient the benefits, risks, side effects, alternatives, likelihood of achieving goals and potential problems during recovery for the procedure that I have provided informed consent.    Scheduling Instructions:     Note: Always confirm laterality of pain with Mr. Baruch, before procedure.     Transcribe to consent form and obtain patient signature.    Physician/Practitioner attestation of informed consent for procedure/surgical case:   I, the physician/practitioner, attest that I have discussed with the patient the benefits, risks, side effects, alternatives, likelihood of achieving goals and potential problems during recovery for the procedure that I have provided informed consent.    Procedure:   Myoneural Block (Trigger Point injection)    Physician/Practitioner performing the procedure:   Brookelin Felber A. Tanya MD    Indication/Reason:   Musculoskeletal pain/myofascial pain secondary to trigger point   Saline lock IV    Have LR [phone] available and administer at 125 mL/hr if patient becomes hypotensive.    Standing Status:   Standing    Number of Occurrences:   1   Bleeding precautions    Standing Status:   Standing    Number of Occurrences:   1    Opioid Analgesic:  No opioid analgesics prescribed by our practice.   Medications ordered for procedure: Meds ordered this encounter  Medications   iohexol  (OMNIPAQUE ) 180 MG/ML injection 10 mL    Must be Myelogram-compatible. If not available, you may substitute with a water-soluble, non-ionic, hypoallergenic, myelogram-compatible radiological contrast medium.   lidocaine  (XYLOCAINE ) 2 % (with pres) injection 400 mg   pentafluoroprop-tetrafluoroeth (GEBAUERS) aerosol   midazolam  (VERSED ) 5 MG/5ML injection 0.5-2 mg    Make sure Flumazenil is available in the pyxis when using this  medication. If oversedation occurs, administer 0.2 mg IV over 15 sec. If after 45 sec no response, administer 0.2 mg again over 1 min; may repeat at 1  min intervals; not to exceed 4 doses (1 mg)   fentaNYL  (SUBLIMAZE ) injection 25-50 mcg    Make sure Narcan is available in the pyxis when using this medication. In the event of respiratory depression (RR< 8/min): Titrate NARCAN (naloxone) in increments of 0.1 to 0.2 mg IV at 2-3 minute intervals, until desired degree of reversal.   sodium chloride  flush (NS) 0.9 % injection 1 mL   ropivacaine  (PF) 2 mg/mL (0.2%) (NAROPIN ) injection 1 mL   dexamethasone  (DECADRON ) injection 10 mg   triamcinolone  acetonide (KENALOG -40) injection 40 mg   ropivacaine  (PF) 2 mg/mL (0.2%) (NAROPIN ) injection 4 mL   Medications administered: We administered iohexol , lidocaine , pentafluoroprop-tetrafluoroeth, midazolam , sodium chloride  flush, ropivacaine  (PF) 2 mg/mL (0.2%), dexamethasone , triamcinolone  acetonide, and ropivacaine  (PF) 2 mg/mL (0.2%).  See the medical record for exact dosing, route, and time of administration.    Interventional Therapies  Risk Factors  Considerations  Medical Comorbidities:  Plavix  Anticoagulation: (Stop: 7-10 days  Restart: 2 hours)  TIAs  HTN  T2NIDDM  (B) Carotid Stenosis  RBBB            Planned  Pending:   X-rays of the cervical spine ordered (09/05/2024)  Prednisone  taper ordered (09/05/2024)  Physical therapy trial for neck pain ordered (09/05/2024)  TENS unit recommended (09/05/2024)  Diagnostic/therapeutic left cervical ESI #1    Under consideration:   Therapeutic right L3 & L4 TFESI #4    Completed:   Therapeutic right L4 TFESI x3 (07/31/2024) (100/100/90/90) (LEP: 100%) (LBP: 90%)  Therapeutic right L3 TFESI x1 (07/31/2024) (100/100/90/90) (LEP: 100%) (LBP: 90%)  Diagnostic/therapeutic left IA hip joint injection x2 (12/01/2023) (100/100/100/90-100)    Therapeutic  Palliative (PRN) options:    Palliative/therapeutic left IA hip joint injection #3  Palliative right L4-5 LESI (none done by me)   Completed by other providers:   The patient indicated having had LESI x4, by Dr. Dannial. (before 2000) (Procedure described as helpful).        Follow-up plan:   Return in about 2 weeks (around 09/27/2024) for (Face2F), (PPE).     Recent Visits Date Type Provider Dept  09/05/24 Office Visit Tanya Glisson, MD [facility]  08/27/24 Office Visit Tanya Glisson, MD [facility]  07/31/24 Procedure visit Tanya Glisson, MD [facility]  07/23/24 Office Visit Tanya Glisson, MD [facility]  07/10/24 Procedure visit Tanya Glisson, MD [facility]  06/25/24 Office Visit Tanya Glisson, MD [facility]  06/18/24 Office Visit Tanya Glisson, MD [facility]  Showing recent visits within past 90 days and meeting all other requirements Today's Visits Date Type Provider Dept  09/13/24 Procedure visit Tanya Glisson, MD [facility]  Showing today's visits and meeting all other requirements Future Appointments Date Type Provider Dept  10/10/24 Appointment Tanya Glisson, MD [facility]  Showing future appointments within next 90 days and meeting all other requirements   Disposition: Discharge home  Discharge (Date  Time): 09/13/2024; 1430 hrs.   Primary Care Physician: Cyrus Selinda Moose, PA-C Location: [facility] Note by: Glisson DELENA Tanya, MD (TTS technology used. I apologize for any typographical errors that were not detected and corrected.) Date: 09/13/2024; Time: 2:50 PM  Disclaimer:  Medicine is not an visual merchandiser. The only guarantee in medicine is that nothing is guaranteed. It is important to note that the decision to proceed with this intervention was based on the information collected from the patient. The Data and  conclusions were drawn from the patient's questionnaire, the interview, and the physical examination. Because the information was provided in large part by the patient, it cannot be guaranteed that it has not been purposely or unconsciously manipulated. Every effort has been made to obtain as much relevant data as possible for this evaluation. It is important to note that the conclusions that lead to this procedure are derived in large part from the available data. Always take into account that the treatment will also be dependent on availability of resources and existing treatment guidelines, considered by other Pain Management Practitioners as being common knowledge and practice, at the time of the intervention. For Medico-Legal purposes, it is also important to point out that variation in procedural techniques and pharmacological choices are the acceptable norm. The indications, contraindications, technique, and results of the above procedure should only be interpreted and judged by a Board-Certified Interventional Pain Specialist with extensive familiarity and expertise in the same exact procedure and technique.

## 2024-09-14 NOTE — Telephone Encounter (Signed)
 Called PP. States he is doing [employer]. Instructed to call if needed.

## 2024-10-10 ENCOUNTER — Ambulatory Visit: Admitting: Pain Medicine

## 2024-10-31 ENCOUNTER — Ambulatory Visit: Admitting: Pain Medicine

## 2024-10-31 ENCOUNTER — Encounter: Payer: Self-pay | Admitting: Pain Medicine

## 2024-10-31 VITALS — BP 126/63 | HR 79 | Temp 97.4°F | Resp 16 | Ht 70.0 in | Wt 210.0 lb

## 2024-10-31 DIAGNOSIS — M4802 Spinal stenosis, cervical region: Secondary | ICD-10-CM | POA: Insufficient documentation

## 2024-10-31 DIAGNOSIS — Z09 Encounter for follow-up examination after completed treatment for conditions other than malignant neoplasm: Secondary | ICD-10-CM

## 2024-10-31 DIAGNOSIS — M542 Cervicalgia: Secondary | ICD-10-CM | POA: Insufficient documentation

## 2024-10-31 DIAGNOSIS — R937 Abnormal findings on diagnostic imaging of other parts of musculoskeletal system: Secondary | ICD-10-CM | POA: Diagnosis not present

## 2024-10-31 DIAGNOSIS — M5030 Other cervical disc degeneration, unspecified cervical region: Secondary | ICD-10-CM

## 2024-10-31 DIAGNOSIS — M5382 Other specified dorsopathies, cervical region: Secondary | ICD-10-CM | POA: Diagnosis not present

## 2024-10-31 DIAGNOSIS — M47812 Spondylosis without myelopathy or radiculopathy, cervical region: Secondary | ICD-10-CM | POA: Diagnosis not present

## 2024-10-31 DIAGNOSIS — G8929 Other chronic pain: Secondary | ICD-10-CM

## 2024-10-31 NOTE — Progress Notes (Signed)
 Safety precautions to be maintained throughout the outpatient stay will include: orient to surroundings, keep bed in low position, maintain call bell within reach at all times, provide assistance with transfer out of bed and ambulation.

## 2024-10-31 NOTE — Progress Notes (Signed)
 PROVIDER NOTE: Interpretation of information contained herein should be left to medically-trained personnel. Specific patient instructions are provided elsewhere under Patient Instructions section of medical record. This document was created in part using AI and STT-dictation technology, any transcriptional errors that may result from this process are unintentional.  Patient: Brandon Bright  Service: E/M   PCP: Cyrus Selinda Moose, PA-C  DOB: 08/19/1946  DOS: 10/31/2024  Provider: Eric DELENA Como, MD  MRN: [id number]  Delivery: Face-to-face  Specialty: Interventional Pain Management  Type: Established Patient  Setting: Ambulatory outpatient facility  Specialty designation: 09  Referring Prov.: Cyrus, Selinda Moose, PA-C  Location: Outpatient office facility       History of present illness (HPI) [name], a 79 y.o. year old male, is here today because of his Cervicalgia [M54.2]. Mr. Skow primary complain today is Neck Injury (Left sided )  Pertinent problems: Mr. Saindon has DDD (degenerative disc disease), cervical; DDD (degenerative disc disease), lumbar; Lumbar facet syndrome; Cervical facet syndrome; Degenerative arthritis of spine; Tendinitis of wrist; Chronic lower extremity pain (1ry area of Pain) (Right); Chronic pain syndrome; Lumbar spondylosis; Chronic midline low back pain (2ry area of Pain) w/ sciatica (Right); Lumbar discogenic pain syndrome; Chronic neck pain (3ry area of Pain) (Left); Lumbar radiculitis (Right) (L3 & L4); Lumbar foraminal stenosis (Left: L1-2, L2-3, L5-S1) (Right: L3-4, L4-5) (SEVERE: L4-5); Lumbar disc protrusion (Left: L2-3) (Central: L5-S1); Lumbar facet arthropathy (Bilateral); Cervicalgia; Chronic hip pain (Left); Arthralgia of hip (Left); Osteoarthritis of hip (Left); Enthesopathy of hip region (Left); Other bursitis of hip (Left); Lumbosacral radiculopathy at L4 (Right); Abnormal MRI, lumbar spine (03/22/2022); Chronic pain disorder; Chronic  hip pain (Right); Abnormal CT scan, cervical spine (05/27/2018); Painful cervical range of motion; Impaired range of motion of cervical spine; Limited active range of motion (AROM) of cervical spine on rotation; Acute neck pain; Trigger point with neck pain; Myofascial pain syndrome, cervical; 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  Pain Assessment: Severity of Chronic pain is reported as a 0-No pain/10. Location: Neck Left/Can radaite into left shoulder when turning head to the left (but not always). Onset: More than a month ago. Quality: Sore. Timing: Intermittent. Modifying factor(s): Procedures. Vitals:  height is 5' 10 (1.778 m) and weight is 210 lb (95.3 kg). His temporal temperature is 97.4 F (36.3 C) (abnormal). His blood pressure is 126/63 and his pulse is 79. His respiration is 16 and oxygen saturation is 98%.  BMI: Estimated body mass index is 30.13 kg/m as calculated from the following:   Height as of this encounter: 5' 10 (1.778 m).   Weight as of this encounter: 210 lb (95.3 kg).  Last encounter: 09/05/2024. Last procedure: 09/13/2024.  Reason for encounter: post-procedure evaluation and assessment.   Discussed the use of AI scribe software for clinical note transcription with the patient, who gave verbal consent to proceed.  History of Present Illness   Brandon Bright is an 79 year old male who presents for a post-procedure evaluation following a left-sided cervical epidural steroid injection and trapezius muscle trigger point injection.  He underwent a left-sided cervical epidural steroid injection at C7-T1 and a left-sided trapezius muscle trigger point injection on September 13, 2024, his first time receiving these. Pre-procedure pain was 8/10 and decreased to 0/10 immediately after, with 100% relief during the local anesthetic and ongoing relief of about 90%.  He now has mild  left-sided neck  pain radiating into the left shoulder, worse with turning his head to the left. He describes this as a transient muscle crick and notes it is different from his prior pain, which radiated more broadly over the shoulder.  A recent cervical spine X-ray with flexion and extension was noncontributory. A CT scan from May 27, 2018, reportedly showed degenerative changes in the cervical spine.  He is in physical therapy, initially twice weekly for a month and now once weekly, and performs wall slides and neck stretches at home. He reports improved neck mobility. He has not tried traction therapy.  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 He reports good hand strength with decreased ability to lift heavy objects.      Post-Procedure Evaluation    No.1: Cervical Epidural Steroid injection (CESI) (Interlaminar) #1  Laterality: Left (-LT)  Level: C7-T1 DOS: 09/13/2024  Provider: Eric DELENA Como, MD Imaging: Fluoroscopy-guided Spinal ([id number]) Anesthesia: Local anesthesia (1-2% Lidocaine ) Anxiolysis: IV Versed   2.0 mg           Sedation: Minimal Sedation None required. No Fentanyl  administered.          Medical Necessity Purpose: Diagnostic/Therapeutic Rationale (medical necessity): procedure needed and proper for the diagnosis and/or treatment of Mr. Gergen's medical symptoms and needs. Indications: Cervicalgia, cervical radicular pain, degenerative disc disease, severe enough to impact quality of life or function. 1. Cervicalgia   2. Cervical facet syndrome   3. DDD (degenerative disc disease), cervical   4. Abnormal CT scan, cervical spine (05/27/2018)   5. Impaired range of motion of cervical spine   6. Limited active range of motion (AROM) of cervical spine on rotation   7. Painful cervical range of motion    Procedure No.2: Trapezius Trigger Point Injection (Myoneural Block) (1-2 muscle groups)  #1 (w/ steroids)  CPT:  20552 Laterality: Left (-LT)  Muscle: Trapezius Target: Myoneural mass of trigger point. Region: Neck & upper back Anatomic Surface: Posterior Anatomic Area: Cervical Level: Trapezius  Approach: Percutaneous  Type of procedure: Myoneural injection  Imaging: N/A. Landmark-guided,           Anesthesia: Local anesthesia (1-2% Lidocaine )  Purpose: Diagnostic/Therapeutic Rationale (medical necessity): procedure needed and proper for the diagnosis and/or treatment of Mr. Knieriem's medical symptoms and needs. Indications: Acute neck pain severe enough to impact quality of life and/or function. 1. Cervicalgia   2. Acute neck pain   3. Myofascial pain syndrome, cervical   4. Trigger point with neck pain     NAS-11 Pain score:   Pre-procedure: 8 /10   Post-procedure: 0-No pain/10     Effectiveness:  Initial hour after procedure: 100 %. Subsequent 4-6 hours post-procedure: 100 %. Analgesia past initial 6 hours: 90 % (Having some only pain on left side of neck into left shoulder when turning towards the left side). Ongoing improvement:  Analgesic: The patient indicates having attained 100% relief of the pain for the duration of local anesthetic followed by an ongoing 90% improvement of his neck pain and left shoulder pain.  He refers that at this point the only symptoms that he has are associated with a nagging sensation in the area of the left side of his neck, specifically when he rotates his head towards the left side. Function: Mr. Lawhorn reports improvement in function ROM: Mr. Lachman reports improvement in ROM Interpretation: After reviewing the diagnostic portion of the above treatment, it is clear that this patient was having issues with an inflammatory  process in the cervical spine.  Further review of a CT scan that he had done in 2019 pointed out had bilateral cervical foraminal stenosis at the C3-4, C4-5, and C5-6 levels.  This is likely to have progressed and for this reason we  will be ordering a repeat CT scan of the cervical spine.  Since he is currently not having any cervical radiculopathic symptomatology, it is my impression that this pain is possibly coming from the cervical facets on the left side, as well as a contribution of a compressive left radiculopathy when he rotates his cervical spine towards the left side.  There cervical CT should be more than appropriate to evaluate this pathology.  Pharmacotherapy Assessment   Opioid Analgesic:  No opioid analgesics prescribed by our practice.  Monitoring:  PMP: PDMP reviewed during this encounter.       Pharmacotherapy: No side-effects or adverse reactions reported. Compliance: No problems identified. Effectiveness: Clinically acceptable.  Bonner Norris, RN  10/31/2024  2:13 PM  Sign when Signing Visit Safety precautions to be maintained throughout the outpatient stay will include: orient to surroundings, keep bed in low position, maintain call bell within reach at all times, provide assistance with transfer out of bed and ambulation.     UDS:  No results found for: SUMMARY  No results found for: CBDTHCR No results found for: D8THCCBX No results found for: D9THCCBX  ROS  Constitutional: Denies any fever or chills Gastrointestinal: No reported hemesis, hematochezia, vomiting, or acute GI distress Musculoskeletal: Denies any acute onset joint swelling, redness, loss of ROM, or weakness Neurological: No reported episodes of acute onset apraxia, aphasia, dysarthria, agnosia, amnesia, paralysis, loss of coordination, or loss of consciousness  Medication Review  Centrum Silver  50+Men, Hypertonic Nasal Wash, Multiple Vitamins-Minerals, amLODipine , atorvastatin , clopidogrel , gabapentin , ipratropium, latanoprost, potassium chloride , and valsartan -hydrochlorothiazide   History Review  Allergy: Mr. Wishon is allergic to codeine, vicodin [hydrocodone-acetaminophen], celebrex [celecoxib], and ultram  [tramadol]. Drug: Mr. Whitby  reports no history of drug use. Alcohol:  reports no history of alcohol use. Tobacco:  reports that he has never smoked. He has never used smokeless tobacco. Social: Mr. Batzel  reports that he has never smoked. He has never used smokeless tobacco. He reports that he does not drink alcohol and does not use drugs. 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 Surgical: Mr. Woodrome  has a past surgical history that includes ganglion cyst removal (Right); Cataract extraction w/PHACO (Left, 02/04/2016); Colonoscopy; Pilonidal cyst excision; Cardiac catheterization (1999?); Cataract extraction w/PHACO (Right, 03/03/2016); Eye surgery (May and June); Colonoscopy with propofol  (N/A, 06/05/2018); Colonoscopy with propofol  (N/A, 03/21/2023); biopsy (03/21/2023); and polypectomy (03/21/2023). Family: family history includes Dementia in his mother; Diabetes in his brother; Heart disease in his brother, brother, and father.  Laboratory Chemistry Profile   Renal Lab Results  Component Value Date   BUN 27 (H) 04/29/2023   CREATININE 0.96 04/29/2023   GFRAA >60 08/17/2014   GFRNONAA >60 04/29/2023    Hepatic Lab Results  Component Value Date   AST 21 04/28/2023   ALT 24 04/28/2023   ALBUMIN 3.8 04/28/2023   ALKPHOS 66 04/28/2023   LIPASE 75 08/16/2014    Electrolytes Lab Results  Component Value Date   NA 141 04/29/2023   K 3.6 04/29/2023   CL 105 04/29/2023   CALCIUM   9.6 04/29/2023   MG 2.2 04/28/2023    Bone No results found for: VD25OH, CI874NY7UNU, CI6874NY7, CI7874NY7, 25OHVITD1, 25OHVITD2, 25OHVITD3, TESTOFREE, TESTOSTERONE  Inflammation (CRP: Acute Phase)  (ESR: Chronic Phase) Lab Results  Component Value Date   CRP 2 08/28/2020   ESRSEDRATE 26 08/28/2020         Note: Above Lab results reviewed.  Recent Imaging Review  DG PAIN CLINIC C-ARM 1-60 MIN NO REPORT Fluoro was used, but no Radiologist interpretation will be provided.  Please refer to NOTES tab for provider progress note. Note: Reviewed        Physical Exam  Vitals: BP 126/63 (Patient Position: Sitting, Cuff Size: Normal)   Pulse 79   Temp (!) 97.4 F (36.3 C) (Temporal)   Resp 16   Ht 5' 10 (1.778 m)   Wt 210 lb (95.3 kg)   SpO2 98%   BMI 30.13 kg/m  BMI: Estimated body mass index is 30.13 kg/m as calculated from the following:   Height as of this encounter: 5' 10 (1.778 m).   Weight as of this encounter: 210 lb (95.3 kg). Ideal: Ideal body weight: 73 kg (160 lb 15 oz) Adjusted ideal body weight: 81.9 kg (180 lb 9 oz) General appearance: Well nourished, well developed, and well hydrated. In no apparent acute distress Mental status: Alert, oriented x 3 (person, place, & time)       Respiratory: No evidence of acute respiratory distress Eyes: PERLA   Assessment   Diagnosis Status  1. Cervicalgia   2. Acute neck pain   3. Chronic neck pain (3ry area of Pain) (Left)   4. 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 Cervical facet joint pain (L>R)   6. Cervical facet syndrome   7. DDD (degenerative disc disease), cervical   8. Impaired range of motion of cervical spine   9. Painful cervical range of motion   10. Limited active range of motion (AROM) of cervical spine on rotation   11. Abnormal CT scan, cervical spine (05/27/2018)   12. 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 A recent cervical epidural steroid injection provided significant relief, though residual pain persists, especially with neck movement. A 2019 CT scan showed degenerative changes and foraminal narrowing at C3-4, C4-5, and C5-6 levels. Current symptoms suggest possible progression of foraminal narrowing or arthritis in the cervical spine. Recent flexion-extension x-rays show no instability. Pain is localized, described as a muscle ache, with cracking and popping sounds during movement, indicating possible arthritis. Ordered a CT scan of the cervical spine to assess for progression of foraminal narrowing and arthritis. Advised continuation of physical therapy exercises at home to maintain range of motion and strength. Recommended discussing cervical traction devices with a physical therapist to assess potential benefit. Instructed to monitor for any new symptoms such as pain, numbness, or weakness radiating down the arm, which would necessitate further imaging.  Post-procedure follow-up after cervical epidural steroid and trigger point injections   Following a left-sided cervical epidural steroid injection at C7-T1 and a left-sided trapezius muscle trigger point injection, the initial pain score of 8/10 reduced to  0/10 immediately post-procedure. Pain returned but remains 90% improved. Current pain is mild and localized to the left neck and shoulder region, exacerbated by turning the head to the left. Continue to monitor pain levels and symptoms. Encouraged adherence to physical therapy regimen and home exercises.       Mr. REUEL LAMADRID has a current medication list which includes the following long-term medication(s): amlodipine , atorvastatin , gabapentin , hypertonic nasal wash, ipratropium, potassium chloride , and valsartan -hydrochlorothiazide .  Pharmacotherapy (Medications Ordered): No orders of the defined  types were placed in this encounter.  Orders:  Orders Placed This Encounter  Procedures   CT CERVICAL SPINE WO CONTRAST    Patient presents with axial pain with possible radicular component. Please assist us  in identifying specific level(s) and laterality of any additional findings such as: 1. Facet (Zygapophyseal) joint DJD (Hypertrophy, space narrowing, subchondral sclerosis, and/or osteophyte formation) 2. DDD and/or IVDD (Loss of disc height, desiccation, gas patterns, osteophytes, endplate sclerosis, or Black disc disease) 3. Pars defects 4. Spondylolisthesis, spondylosis, and/or spondyloarthropathies (include Degree/Grade of displacement in mm) (stability) 5. Vertebral body Fractures (acute/chronic) (state percentage of collapse) 6. Demineralization (osteopenia/osteoporotic) 7. Bone pathology 8. Foraminal narrowing  9. Surgical changes 10. Central, Lateral Recess, and/or Foraminal Stenosis (include AP diameter of stenosis in mm) 11. Surgical changes (hardware type, status, and presence of fibrosis) 12. Modic Type Changes (MRI only) 13. IVDD (Disc bulge, protrusion, herniation, extrusion) (Level, laterality, extent)  Medical necessity: Imaging study necessary to evaluate for possible degenerative disease responsible for neurologic dysfunction that may be caused by compression or inflammation of CNS and to help plan for interventional therapy or surgery, such as decompression of a pinched nerve or spinal fusion.    Standing Status:   Future    Expiration Date:   01/28/2025    Scheduling Instructions:     Please make sure that the patient understands that this needs to be done as soon as possible. Never have the patient do the imaging just before the next appointment. Inform patient that having the imaging done within the [facility] will expedite the availability of the results and will provide      imaging availability to the requesting physician. In addition inform the patient that  the imaging order has an expiration date and will not be renewed if not done within the active period.    Preferred imaging location?:   [facility]    Call Results- Best Contact Number?:   [phone] [facility]    Radiology Contrast Protocol - do NOT remove file path:   [email or web address]    Release to patient:   Immediate [1]   Nursing Instructions:    Please complete this patient's postprocedure evaluation.    Scheduling Instructions:     Please complete this patient's postprocedure evaluation.     Interventional Therapies  Risk Factors  Considerations  Medical Comorbidities:  Plavix  Anticoagulation: (Stop: 7-10 days  Restart: 2 hours)  TIAs  HTN  T2NIDDM  (B) Carotid Stenosis  RBBB            Planned  Pending:   Continue with physical therapy for neck and left upper back  Diagnostic CT scan of the cervical spine (ordered on 10/31/2024)    Under consideration:   Therapeutic right L3 & L4 TFESI #4  Therapeutic left cervical ESI + left trapezius muscle TPI #2    Completed:   Diagnostic/therapeutic left cervical ESI x1 (09/13/2024) (100/100/90/90)  Therapeutic left trapezius  TPI/MNB x1 (09/13/2024) (100/100/90/90)  Therapeutic right L4 TFESI x3 (07/31/2024) (100/100/90/90) (LEP: 100%) (LBP: 90%)  Therapeutic right L3 TFESI x1 (07/31/2024) (100/100/90/90) (LEP: 100%) (LBP: 90%)  Diagnostic/therapeutic left IA hip joint injection x2 (12/01/2023) (100/100/100/90-100)  X-rays of the cervical spine ordered (09/05/2024)  Prednisone  taper ordered (09/05/2024)  Physical therapy trial for neck pain ordered (09/05/2024) (very helpful) TENS unit recommended (09/05/2024)    Therapeutic  Palliative (PRN) options:   Palliative/therapeutic left IA hip joint injection #3  Palliative right L4-5 LESI (none done by me)   Completed by other providers:   The patient indicated having had LESI x4,  by Dr. Dannial. (before 2000) (Procedure described as helpful).       Return if symptoms worsen or fail to improve.    Recent Visits Date Type Provider Dept  09/13/24 Procedure visit Tanya Glisson, MD [facility]  09/05/24 Office Visit Tanya Glisson, MD [facility]  08/27/24 Office Visit Tanya Glisson, MD [facility]  Showing recent visits within past 90 days and meeting all other requirements Today's Visits Date Type Provider Dept  10/31/24 Office Visit Tanya Glisson, MD [facility]  Showing today's visits and meeting all other requirements Future Appointments No visits were found meeting these conditions. Showing future appointments within next 90 days and meeting all other requirements  I discussed the assessment and treatment plan with the patient. The patient was provided an opportunity to ask questions and all were answered. The patient agreed with the plan and demonstrated an understanding of the instructions.  Patient advised to call back or seek an in-person evaluation if the symptoms or condition worsens.  Duration of encounter: 30 minutes.  Total time on encounter, as per AMA guidelines included both the face-to-face and non-face-to-face time personally spent by the physician and/or other qualified health care professional(s) on the day of the encounter (includes time in activities that require the physician or other qualified health care professional and does not include time in activities normally performed by clinical staff). Physician's time may include the following activities when performed: Preparing to see the patient (e.g., pre-charting review of records, searching for previously ordered imaging, lab work, and nerve conduction tests) Review of prior analgesic pharmacotherapies. Reviewing PMP Interpreting ordered tests (e.g., lab work, imaging, nerve conduction tests) Performing post-procedure evaluations,  including interpretation of diagnostic procedures Obtaining and/or reviewing separately obtained history Performing a medically appropriate examination and/or evaluation Counseling and educating the patient/family/caregiver Ordering medications, tests, or procedures Referring and communicating with other health care professionals (when not separately reported) Documenting clinical information in the electronic or other health record Independently interpreting results (not separately reported) and communicating results to the patient/ family/caregiver Care coordination (not separately reported)  Note by: Glisson DELENA Tanya, MD (TTS and AI technology used. I apologize for any typographical errors that were not detected and corrected.) Date: 10/31/2024; Time: 2:52 PM

## 2024-11-06 ENCOUNTER — Ambulatory Visit

## 2024-12-10 ENCOUNTER — Ambulatory Visit: Admitting: Pain Medicine
